# Patient Record
Sex: Male | Born: 1935 | Race: White | Hispanic: No | Marital: Married | Smoking: Former smoker
Health system: Southern US, Community
[De-identification: ages and names within clinical notes are randomized; demographics above are authoritative.]

## PROBLEM LIST (undated history)

## (undated) DIAGNOSIS — I221 Subsequent ST elevation (STEMI) myocardial infarction of inferior wall: Secondary | ICD-10-CM

## (undated) DIAGNOSIS — J8410 Pulmonary fibrosis, unspecified: Secondary | ICD-10-CM

## (undated) DIAGNOSIS — E039 Hypothyroidism, unspecified: Secondary | ICD-10-CM

## (undated) DIAGNOSIS — K449 Diaphragmatic hernia without obstruction or gangrene: Secondary | ICD-10-CM

## (undated) DIAGNOSIS — J302 Other seasonal allergic rhinitis: Secondary | ICD-10-CM

## (undated) DIAGNOSIS — K635 Polyp of colon: Secondary | ICD-10-CM

## (undated) DIAGNOSIS — K602 Anal fissure, unspecified: Secondary | ICD-10-CM

## (undated) DIAGNOSIS — I10 Essential (primary) hypertension: Secondary | ICD-10-CM

## (undated) DIAGNOSIS — N189 Chronic kidney disease, unspecified: Secondary | ICD-10-CM

## (undated) DIAGNOSIS — I2510 Atherosclerotic heart disease of native coronary artery without angina pectoris: Secondary | ICD-10-CM

## (undated) DIAGNOSIS — I2119 ST elevation (STEMI) myocardial infarction involving other coronary artery of inferior wall: Secondary | ICD-10-CM

## (undated) DIAGNOSIS — E785 Hyperlipidemia, unspecified: Secondary | ICD-10-CM

## (undated) DIAGNOSIS — K219 Gastro-esophageal reflux disease without esophagitis: Secondary | ICD-10-CM

## (undated) DIAGNOSIS — H269 Unspecified cataract: Secondary | ICD-10-CM

## (undated) HISTORY — DX: Unspecified cataract: H26.9

## (undated) HISTORY — DX: Diaphragmatic hernia without obstruction or gangrene: K44.9

## (undated) HISTORY — DX: Essential (primary) hypertension: I10

## (undated) HISTORY — PX: POLYPECTOMY: SHX149

## (undated) HISTORY — DX: Other seasonal allergic rhinitis: J30.2

## (undated) HISTORY — DX: Hyperlipidemia, unspecified: E78.5

## (undated) HISTORY — DX: Atherosclerotic heart disease of native coronary artery without angina pectoris: I25.10

## (undated) HISTORY — PX: COLONOSCOPY: SHX174

## (undated) HISTORY — DX: Subsequent ST elevation (STEMI) myocardial infarction of inferior wall: I22.1

## (undated) HISTORY — DX: Anal fissure, unspecified: K60.2

## (undated) HISTORY — DX: Pulmonary fibrosis, unspecified: J84.10

## (undated) HISTORY — DX: ST elevation (STEMI) myocardial infarction involving other coronary artery of inferior wall: I21.19

## (undated) HISTORY — DX: Chronic kidney disease, unspecified: N18.9

## (undated) HISTORY — DX: Gastro-esophageal reflux disease without esophagitis: K21.9

## (undated) HISTORY — DX: Polyp of colon: K63.5

## (undated) HISTORY — DX: Hypothyroidism, unspecified: E03.9

---

## 1982-11-29 DIAGNOSIS — I2119 ST elevation (STEMI) myocardial infarction involving other coronary artery of inferior wall: Secondary | ICD-10-CM

## 1982-11-29 HISTORY — DX: ST elevation (STEMI) myocardial infarction involving other coronary artery of inferior wall: I21.19

## 1990-11-29 HISTORY — PX: ABDOMINAL AORTIC ANEURYSM REPAIR: SUR1152

## 1997-11-29 DIAGNOSIS — H269 Unspecified cataract: Secondary | ICD-10-CM

## 1997-11-29 HISTORY — DX: Unspecified cataract: H26.9

## 1999-05-28 ENCOUNTER — Inpatient Hospital Stay (HOSPITAL_COMMUNITY): Admission: EM | Admit: 1999-05-28 | Discharge: 1999-05-29 | Payer: Self-pay | Admitting: Emergency Medicine

## 1999-05-28 ENCOUNTER — Encounter: Payer: Self-pay | Admitting: Cardiology

## 2001-01-30 ENCOUNTER — Ambulatory Visit (HOSPITAL_COMMUNITY): Admission: RE | Admit: 2001-01-30 | Discharge: 2001-01-30 | Payer: Self-pay | Admitting: Gastroenterology

## 2001-01-30 ENCOUNTER — Encounter (INDEPENDENT_AMBULATORY_CARE_PROVIDER_SITE_OTHER): Payer: Self-pay | Admitting: *Deleted

## 2002-11-29 HISTORY — PX: CORONARY ARTERY BYPASS GRAFT: SHX141

## 2003-02-28 DIAGNOSIS — I221 Subsequent ST elevation (STEMI) myocardial infarction of inferior wall: Secondary | ICD-10-CM

## 2003-02-28 HISTORY — DX: Subsequent ST elevation (STEMI) myocardial infarction of inferior wall: I22.1

## 2003-02-28 HISTORY — PX: CARDIAC CATHETERIZATION: SHX172

## 2003-03-25 ENCOUNTER — Encounter: Payer: Self-pay | Admitting: Cardiothoracic Surgery

## 2003-03-25 ENCOUNTER — Encounter: Payer: Self-pay | Admitting: Emergency Medicine

## 2003-03-25 ENCOUNTER — Inpatient Hospital Stay (HOSPITAL_COMMUNITY): Admission: EM | Admit: 2003-03-25 | Discharge: 2003-04-01 | Payer: Self-pay | Admitting: Emergency Medicine

## 2003-03-26 ENCOUNTER — Encounter: Payer: Self-pay | Admitting: Cardiothoracic Surgery

## 2003-03-27 ENCOUNTER — Encounter: Payer: Self-pay | Admitting: Cardiothoracic Surgery

## 2003-03-28 ENCOUNTER — Encounter: Payer: Self-pay | Admitting: Cardiothoracic Surgery

## 2004-02-19 ENCOUNTER — Ambulatory Visit (HOSPITAL_COMMUNITY): Admission: RE | Admit: 2004-02-19 | Discharge: 2004-02-19 | Payer: Self-pay | Admitting: Gastroenterology

## 2004-02-19 ENCOUNTER — Encounter (INDEPENDENT_AMBULATORY_CARE_PROVIDER_SITE_OTHER): Payer: Self-pay | Admitting: *Deleted

## 2004-03-16 ENCOUNTER — Emergency Department (HOSPITAL_COMMUNITY): Admission: EM | Admit: 2004-03-16 | Discharge: 2004-03-16 | Payer: Self-pay | Admitting: Emergency Medicine

## 2004-05-15 ENCOUNTER — Ambulatory Visit (HOSPITAL_COMMUNITY): Admission: RE | Admit: 2004-05-15 | Discharge: 2004-05-15 | Payer: Self-pay | Admitting: Orthopedic Surgery

## 2005-03-29 HISTORY — PX: TRANSTHORACIC ECHOCARDIOGRAM: SHX275

## 2005-06-28 ENCOUNTER — Ambulatory Visit: Payer: Self-pay | Admitting: Internal Medicine

## 2005-07-26 ENCOUNTER — Ambulatory Visit: Payer: Self-pay | Admitting: Internal Medicine

## 2005-10-25 ENCOUNTER — Ambulatory Visit: Payer: Self-pay | Admitting: Internal Medicine

## 2006-04-26 ENCOUNTER — Ambulatory Visit: Payer: Self-pay | Admitting: Internal Medicine

## 2006-08-24 ENCOUNTER — Ambulatory Visit: Payer: Self-pay | Admitting: Internal Medicine

## 2006-10-13 ENCOUNTER — Ambulatory Visit: Payer: Self-pay | Admitting: Internal Medicine

## 2006-10-18 ENCOUNTER — Ambulatory Visit: Payer: Self-pay | Admitting: Internal Medicine

## 2007-01-06 ENCOUNTER — Ambulatory Visit: Payer: Self-pay | Admitting: Internal Medicine

## 2007-01-06 LAB — CONVERTED CEMR LAB: Creatinine, Ser: 1.2 mg/dL (ref 0.4–1.5)

## 2007-01-11 ENCOUNTER — Ambulatory Visit: Payer: Self-pay | Admitting: Internal Medicine

## 2007-03-30 ENCOUNTER — Ambulatory Visit: Payer: Self-pay | Admitting: Internal Medicine

## 2007-04-13 ENCOUNTER — Ambulatory Visit: Payer: Self-pay | Admitting: Internal Medicine

## 2007-04-26 ENCOUNTER — Ambulatory Visit: Payer: Self-pay | Admitting: Internal Medicine

## 2007-04-26 ENCOUNTER — Encounter: Payer: Self-pay | Admitting: Internal Medicine

## 2007-06-13 ENCOUNTER — Ambulatory Visit: Payer: Self-pay | Admitting: Internal Medicine

## 2007-06-22 ENCOUNTER — Ambulatory Visit: Payer: Self-pay | Admitting: Gastroenterology

## 2007-06-22 LAB — CONVERTED CEMR LAB: Creatinine, Ser: 1.3 mg/dL (ref 0.4–1.5)

## 2007-07-19 ENCOUNTER — Ambulatory Visit: Payer: Self-pay | Admitting: Internal Medicine

## 2008-01-05 ENCOUNTER — Ambulatory Visit: Payer: Self-pay | Admitting: Internal Medicine

## 2008-01-12 ENCOUNTER — Ambulatory Visit: Payer: Self-pay | Admitting: Internal Medicine

## 2008-01-12 DIAGNOSIS — E038 Other specified hypothyroidism: Secondary | ICD-10-CM | POA: Insufficient documentation

## 2008-01-12 DIAGNOSIS — N320 Bladder-neck obstruction: Secondary | ICD-10-CM

## 2008-01-12 DIAGNOSIS — I2510 Atherosclerotic heart disease of native coronary artery without angina pectoris: Secondary | ICD-10-CM | POA: Insufficient documentation

## 2008-01-12 DIAGNOSIS — Z888 Allergy status to other drugs, medicaments and biological substances status: Secondary | ICD-10-CM

## 2008-01-14 LAB — CONVERTED CEMR LAB
ALT: 15 units/L (ref 0–53)
AST: 20 units/L (ref 0–37)
Albumin: 3.7 g/dL (ref 3.5–5.2)
Alkaline Phosphatase: 45 units/L (ref 39–117)
Basophils Absolute: 0 10*3/uL (ref 0.0–0.1)
Calcium: 9.7 mg/dL (ref 8.4–10.5)
Chloride: 108 meq/L (ref 96–112)
Creatinine, Ser: 1.4 mg/dL (ref 0.4–1.5)
Eosinophils Absolute: 0.2 10*3/uL (ref 0.0–0.6)
Eosinophils Relative: 3.4 % (ref 0.0–5.0)
GFR calc non Af Amer: 52 mL/min
HCT: 44.7 % (ref 39.0–52.0)
MCHC: 33.3 g/dL (ref 30.0–36.0)
MCV: 95.9 fL (ref 78.0–100.0)
Neutrophils Relative %: 65 % (ref 43.0–77.0)
Platelets: 155 10*3/uL (ref 150–400)
RBC: 4.66 M/uL (ref 4.22–5.81)
RDW: 11.8 % (ref 11.5–14.6)
Total Bilirubin: 1.1 mg/dL (ref 0.3–1.2)
WBC: 7.3 10*3/uL (ref 4.5–10.5)

## 2008-01-18 ENCOUNTER — Ambulatory Visit: Payer: Self-pay | Admitting: Internal Medicine

## 2008-01-18 DIAGNOSIS — E039 Hypothyroidism, unspecified: Secondary | ICD-10-CM

## 2008-01-18 DIAGNOSIS — E785 Hyperlipidemia, unspecified: Secondary | ICD-10-CM | POA: Insufficient documentation

## 2008-01-18 DIAGNOSIS — I10 Essential (primary) hypertension: Secondary | ICD-10-CM

## 2008-02-14 ENCOUNTER — Ambulatory Visit: Payer: Self-pay | Admitting: Internal Medicine

## 2008-03-21 ENCOUNTER — Ambulatory Visit: Payer: Self-pay | Admitting: Internal Medicine

## 2008-03-30 DIAGNOSIS — I714 Abdominal aortic aneurysm, without rupture, unspecified: Secondary | ICD-10-CM | POA: Insufficient documentation

## 2008-03-30 DIAGNOSIS — K5190 Ulcerative colitis, unspecified, without complications: Secondary | ICD-10-CM

## 2008-03-30 DIAGNOSIS — D126 Benign neoplasm of colon, unspecified: Secondary | ICD-10-CM

## 2008-03-30 DIAGNOSIS — Z87442 Personal history of urinary calculi: Secondary | ICD-10-CM | POA: Insufficient documentation

## 2008-04-24 ENCOUNTER — Ambulatory Visit: Payer: Self-pay | Admitting: Internal Medicine

## 2008-06-27 ENCOUNTER — Ambulatory Visit: Payer: Self-pay | Admitting: Internal Medicine

## 2008-10-28 ENCOUNTER — Encounter: Payer: Self-pay | Admitting: Internal Medicine

## 2008-10-29 ENCOUNTER — Ambulatory Visit: Payer: Self-pay | Admitting: Internal Medicine

## 2008-10-29 DIAGNOSIS — K921 Melena: Secondary | ICD-10-CM

## 2009-05-01 ENCOUNTER — Ambulatory Visit: Payer: Self-pay | Admitting: Internal Medicine

## 2009-05-20 ENCOUNTER — Encounter (INDEPENDENT_AMBULATORY_CARE_PROVIDER_SITE_OTHER): Payer: Self-pay | Admitting: *Deleted

## 2009-06-05 ENCOUNTER — Encounter (INDEPENDENT_AMBULATORY_CARE_PROVIDER_SITE_OTHER): Payer: Self-pay | Admitting: *Deleted

## 2009-07-11 ENCOUNTER — Telehealth: Payer: Self-pay | Admitting: Internal Medicine

## 2009-07-15 ENCOUNTER — Ambulatory Visit: Payer: Self-pay | Admitting: Internal Medicine

## 2009-07-30 ENCOUNTER — Encounter: Payer: Self-pay | Admitting: Internal Medicine

## 2009-07-30 ENCOUNTER — Ambulatory Visit: Payer: Self-pay | Admitting: Internal Medicine

## 2009-07-30 LAB — HM COLONOSCOPY

## 2009-07-31 ENCOUNTER — Encounter: Payer: Self-pay | Admitting: Internal Medicine

## 2009-08-01 ENCOUNTER — Telehealth (INDEPENDENT_AMBULATORY_CARE_PROVIDER_SITE_OTHER): Payer: Self-pay | Admitting: *Deleted

## 2009-08-27 ENCOUNTER — Ambulatory Visit: Payer: Self-pay | Admitting: Internal Medicine

## 2009-08-27 DIAGNOSIS — K219 Gastro-esophageal reflux disease without esophagitis: Secondary | ICD-10-CM | POA: Insufficient documentation

## 2009-09-08 ENCOUNTER — Ambulatory Visit: Payer: Self-pay | Admitting: Internal Medicine

## 2009-10-28 ENCOUNTER — Ambulatory Visit: Payer: Self-pay | Admitting: Internal Medicine

## 2009-10-28 LAB — CONVERTED CEMR LAB
ALT: 16 units/L (ref 0–53)
AST: 23 units/L (ref 0–37)
Albumin: 3.6 g/dL (ref 3.5–5.2)
BUN: 18 mg/dL (ref 6–23)
Basophils Relative: 0.7 % (ref 0.0–3.0)
CO2: 28 meq/L (ref 19–32)
Chloride: 110 meq/L (ref 96–112)
Cholesterol: 121 mg/dL (ref 0–200)
Creatinine, Ser: 1.2 mg/dL (ref 0.4–1.5)
Eosinophils Relative: 3.8 % (ref 0.0–5.0)
Glucose, Bld: 98 mg/dL (ref 70–99)
Hemoglobin: 14 g/dL (ref 13.0–17.0)
LDL Cholesterol: 84 mg/dL (ref 0–99)
MCV: 97.4 fL (ref 78.0–100.0)
Monocytes Absolute: 0.9 10*3/uL (ref 0.1–1.0)
Neutro Abs: 4.8 10*3/uL (ref 1.4–7.7)
Neutrophils Relative %: 61.5 % (ref 43.0–77.0)
Potassium: 4.8 meq/L (ref 3.5–5.1)
RBC: 4.24 M/uL (ref 4.22–5.81)
Total Bilirubin: 0.9 mg/dL (ref 0.3–1.2)
Total Protein: 7.7 g/dL (ref 6.0–8.3)
WBC: 7.9 10*3/uL (ref 4.5–10.5)

## 2009-10-31 ENCOUNTER — Ambulatory Visit: Payer: Self-pay | Admitting: Internal Medicine

## 2009-10-31 DIAGNOSIS — R05 Cough: Secondary | ICD-10-CM

## 2009-10-31 DIAGNOSIS — Z87891 Personal history of nicotine dependence: Secondary | ICD-10-CM | POA: Insufficient documentation

## 2009-10-31 DIAGNOSIS — M79609 Pain in unspecified limb: Secondary | ICD-10-CM | POA: Insufficient documentation

## 2010-01-28 ENCOUNTER — Ambulatory Visit: Payer: Self-pay | Admitting: Internal Medicine

## 2010-01-28 LAB — CONVERTED CEMR LAB: TSH: 0.08 microintl units/mL — ABNORMAL LOW (ref 0.35–5.50)

## 2010-02-02 ENCOUNTER — Ambulatory Visit: Payer: Self-pay | Admitting: Internal Medicine

## 2010-03-10 ENCOUNTER — Ambulatory Visit: Payer: Self-pay | Admitting: Internal Medicine

## 2010-03-10 LAB — CONVERTED CEMR LAB
Basophils Relative: 0.3 % (ref 0.0–3.0)
CO2: 28 meq/L (ref 19–32)
Chloride: 110 meq/L (ref 96–112)
Creatinine, Ser: 1.4 mg/dL (ref 0.4–1.5)
Eosinophils Relative: 2.5 % (ref 0.0–5.0)
Glucose, Bld: 95 mg/dL (ref 70–99)
HCT: 39.4 % (ref 39.0–52.0)
Lymphs Abs: 1.6 10*3/uL (ref 0.7–4.0)
MCV: 94.7 fL (ref 78.0–100.0)
Monocytes Absolute: 0.6 10*3/uL (ref 0.1–1.0)
RBC: 4.16 M/uL — ABNORMAL LOW (ref 4.22–5.81)
Sodium: 144 meq/L (ref 135–145)
WBC: 6.4 10*3/uL (ref 4.5–10.5)

## 2010-03-16 ENCOUNTER — Ambulatory Visit: Payer: Self-pay | Admitting: Internal Medicine

## 2010-05-18 ENCOUNTER — Encounter: Payer: Self-pay | Admitting: Internal Medicine

## 2010-07-06 ENCOUNTER — Ambulatory Visit: Payer: Self-pay | Admitting: Internal Medicine

## 2010-07-06 LAB — CONVERTED CEMR LAB
BUN: 32 mg/dL — ABNORMAL HIGH (ref 6–23)
CO2: 29 meq/L (ref 19–32)
Chloride: 103 meq/L (ref 96–112)
Creatinine, Ser: 1.5 mg/dL (ref 0.4–1.5)
Glucose, Bld: 138 mg/dL — ABNORMAL HIGH (ref 70–99)

## 2010-07-16 ENCOUNTER — Ambulatory Visit: Payer: Self-pay | Admitting: Internal Medicine

## 2010-07-16 DIAGNOSIS — E875 Hyperkalemia: Secondary | ICD-10-CM

## 2010-07-16 DIAGNOSIS — R7309 Other abnormal glucose: Secondary | ICD-10-CM

## 2010-09-17 ENCOUNTER — Ambulatory Visit: Payer: Self-pay | Admitting: Cardiology

## 2010-11-09 ENCOUNTER — Ambulatory Visit: Payer: Self-pay | Admitting: Internal Medicine

## 2010-11-11 LAB — CONVERTED CEMR LAB
ALT: 13 units/L (ref 0–53)
AST: 20 units/L (ref 0–37)
Albumin: 3.8 g/dL (ref 3.5–5.2)
Basophils Relative: 0 % (ref 0.0–3.0)
Blood, UA: NEGATIVE
CO2: 28 meq/L (ref 19–32)
Chloride: 106 meq/L (ref 96–112)
Cholesterol: 162 mg/dL (ref 0–200)
Eosinophils Relative: 1.8 % (ref 0.0–5.0)
HCT: 42.3 % (ref 39.0–52.0)
LDL Cholesterol: 116 mg/dL — ABNORMAL HIGH (ref 0–99)
Lymphs Abs: 1.2 10*3/uL (ref 0.7–4.0)
MCHC: 33.6 g/dL (ref 30.0–36.0)
MCV: 98.4 fL (ref 78.0–100.0)
Monocytes Absolute: 0.8 10*3/uL (ref 0.1–1.0)
Neutro Abs: 4.4 10*3/uL (ref 1.4–7.7)
Nitrite: NEGATIVE
PSA: 1.91 ng/mL (ref 0.10–4.00)
Potassium: 4.5 meq/L (ref 3.5–5.1)
RBC: 4.3 M/uL (ref 4.22–5.81)
Specific Gravity, Urine: 1.015 (ref 1.000–1.030)
Total Protein, Urine: NEGATIVE mg/dL
Total Protein: 7.6 g/dL (ref 6.0–8.3)
WBC: 6.6 10*3/uL (ref 4.5–10.5)
pH: 6 (ref 5.0–8.0)

## 2010-11-29 DIAGNOSIS — K219 Gastro-esophageal reflux disease without esophagitis: Secondary | ICD-10-CM

## 2010-11-29 HISTORY — DX: Gastro-esophageal reflux disease without esophagitis: K21.9

## 2010-12-29 NOTE — Assessment & Plan Note (Signed)
Summary: 6 WKS FOLLOW UP-LB   Vital Signs:  Patient profile:   77 year old male Height:      71 inches Weight:      191.50 pounds BMI:     26.81 O2 Sat:      96 % on Room air Temp:     96.5 degrees F oral Pulse rate:   58 / minute BP sitting:   128 / 84  (left arm) Cuff size:   regular  Vitals Entered By: Chris Stokes (March 16, 2010 2:44 PM)  O2 Flow:  Room air CC: 6 wk f/u./kb Is Patient Diabetic? No Pain Assessment Patient in pain? no        Primary Care Provider:  Sonda Stokes  CC:  6 wk f/u./kb.  History of Present Illness: The patient presents for a follow up of hypertension, CAD, hyperlipidemia   Current Medications (verified): 1)  Coreg 25 Mg  Tabs (Carvedilol) .... Once Daily 2)  Spironolactone 25 Mg Tabs (Spironolactone) .[name].. 1 By Mouth Qd 3)  Asacol 400 Mg  Tbec (Mesalamine) .... 4 By Mouth Three Times A Day 4)  Loratadine 10 Mg  Tabs (Loratadine) .... Once Daily As Needed Allergies 5)  Vitamin D3 1000 Unit  Tabs (Cholecalciferol) .[name].. 1 Qd 6)  Fish Oil   Oil (Fish Oil) .[name].. 1 By Mouth Bid 7)  Aspirin 81 Mg  Tbec (Aspirin) .... One By Mouth Every Day 8)  Altace 10 Mg  Tabs (Ramipril) .[name].. 1 Once Daily 9)  Zocor 80 Mg  Tabs (Simvastatin) .[name].. 1 Once Daily 10)  Ranitidine Hcl 75 Mg  Tabs (Ranitidine Hcl) .[name].. 1 Two Times A Day 11)  Nexium 40 Mg Cpdr (Esomeprazole Magnesium) .[name].. 1 Capsule By Mouth Once Daily 12)  Levothroid 100 Mcg Tabs (Levothyroxine Sodium) .[name].. 1 By Mouth Once Daily For Thyroid  Allergies (verified): 1)  ! Sulfa  Past History:  Past Medical History: Last updated: 10/29/2008 Coronary artery disease Dr Patty Sermons Hyperlipidemia Hypertension Hypothyroidism Ulcerative colitis Dr Marina Goodell  Social History: Last updated: 01/18/2008 Retired Married, wife had a CVA 2009 Former Smoker  Physical Exam  General:  Well developed, well nourished, no acute distress. Ears:  hard hearing Nose:  External nasal examination shows no  deformity or inflammation. Nasal mucosa are pink and moist without lesions or exudates. Mouth:  Erythematous throat mucosa and intranasal erythema.  Lungs:  Clear throughout to auscultation. Heart:  Regular rate and rhythm; no murmurs, rubs,  or bruits. Abdomen:  Soft, nontender and nondistended. No masses, hepatosplenomegaly or hernias noted. Normal bowel sounds. Msk:  Symmetrical with no gross deformities. Normal posture. L extensor index is tender over the nuckle Neurologic:  Alert and  oriented x4; unstedy on one foot B Skin:  Intact without significant lesions or rashes. Psych:  Alert and cooperative. Normal mood and affect.   Impression & Recommendations:  Problem # 1:  HAND PAIN (ICD-729.5) Assessment Improved  Problem # 2:  GERD (ICD-530.81) Assessment: Unchanged  His updated medication list for this problem includes:    Ranitidine Hcl 75 Mg Tabs (Ranitidine hcl) .[name]... 1 two times a day    Nexium 40 Mg Cpdr (Esomeprazole magnesium) .[name]... 1 capsule by mouth once daily  Problem # 3:  HYPOTHYROIDISM (ICD-244.9) Assessment: Improved  His updated medication list for this problem includes:    Levothroid 100 Mcg Tabs (Levothyroxine sodium) .[name]... 1 by mouth once daily for thyroid  Problem # 4:  CORONARY ARTERY DISEASE (ICD-414.00) Assessment: Unchanged  His updated medication list for this problem includes:    Coreg 25 Mg Tabs (Carvedilol) ..... Once daily    Spironolactone 25 Mg Tabs (Spironolactone) .[name]... 1 by mouth qd    Aspirin 81 Mg Tbec (Aspirin) ..... One by mouth every day    Altace 10 Mg Tabs (Ramipril) .[name]... 1 once daily  Complete Medication List: 1)  Coreg 25 Mg Tabs (Carvedilol) .... Once daily 2)  Spironolactone 25 Mg Tabs (Spironolactone) .[name].. 1 by mouth qd 3)  Asacol 400 Mg Tbec (Mesalamine) .... 4 by mouth three times a day 4)  Loratadine 10 Mg Tabs (Loratadine) .... Once daily as needed allergies 5)  Vitamin D3 1000 Unit Tabs (Cholecalciferol) .[name].. 1 qd 6)   Fish Oil Oil (Fish oil) .[name].. 1 by mouth bid 7)  Aspirin 81 Mg Tbec (Aspirin) .... One by mouth every day 8)  Altace 10 Mg Tabs (Ramipril) .[name].. 1 once daily 9)  Zocor 80 Mg Tabs (Simvastatin) .[name].. 1 once daily 10)  Ranitidine Hcl 75 Mg Tabs (Ranitidine hcl) .[name].. 1 two times a day 11)  Nexium 40 Mg Cpdr (Esomeprazole magnesium) .[name].. 1 capsule by mouth once daily 12)  Levothroid 100 Mcg Tabs (Levothyroxine sodium) .[name].. 1 by mouth once daily for thyroid  Patient Instructions: 1)  Please schedule a follow-up appointment in 4 months. 2)  BMP prior to visit, ICD-9:401.1

## 2010-12-29 NOTE — Assessment & Plan Note (Signed)
Summary: 3 MO FU/PN   Vital Signs:  Patient profile:   77 year old male Weight:      191 pounds Temp:     97.8 degrees F oral Pulse rate:   53 / minute BP sitting:   118 / 74  (left arm)  Vitals Entered By: Tora Perches (February 02, 2010 1:03 PM) CC: f/u Is Patient Diabetic? No   Primary Care Provider:  Trinna Post Plotnikov  CC:  f/u.  History of Present Illness: The patient presents for a follow up of hypertension, colitis, hyperlipidemia C/o cold hands  Preventive Screening-Counseling & Management  Alcohol-Tobacco     Smoking Status: quit  Current Medications (verified): 1)  Coreg 25 Mg  Tabs (Carvedilol) .... Once Daily 2)  Spironolactone 25 Mg Tabs (Spironolactone) .[name].. 1 By Mouth Qd 3)  Asacol 400 Mg  Tbec (Mesalamine) .... 4 By Mouth Three Times A Day 4)  Loratadine 10 Mg  Tabs (Loratadine) .... Once Daily As Needed Allergies 5)  Vitamin D3 1000 Unit  Tabs (Cholecalciferol) .[name].. 1 Qd 6)  Fish Oil   Oil (Fish Oil) .[name].. 1 By Mouth Bid 7)  Aspirin 81 Mg  Tbec (Aspirin) .... One By Mouth Every Day 8)  Altace 10 Mg  Tabs (Ramipril) .[name].. 1 Once Daily 9)  Zocor 80 Mg  Tabs (Simvastatin) .[name].. 1 Once Daily 10)  Ranitidine Hcl 75 Mg  Tabs (Ranitidine Hcl) .[name].. 1 Two Times A Day 11)  Nexium 40 Mg Cpdr (Esomeprazole Magnesium) .[name].. 1 Capsule By Mouth Once Daily 12)  Levothroid 150 Mcg Tabs (Levothyroxine Sodium) .[name].. 1 By Mouth Qd  Allergies: 1)  ! Sulfa  Past History:  Past Medical History: Last updated: 10/29/2008 Coronary artery disease Dr Patty Sermons Hyperlipidemia Hypertension Hypothyroidism Ulcerative colitis Dr Marina Goodell  Past Surgical History: Last updated: 01/18/2008 Abdominal aortic aneurysm repair Coronary artery bypass graft  Family History: Last updated: 06/27/2008 Family History Hypertension  Family History of Colon Cancer: cousin  Social History: Last updated: 01/18/2008 Retired Married, wife had a CVA 2009 Former Smoker  Review of Systems   The patient complains of abdominal pain.  The patient denies chest pain, syncope, dyspnea on exertion, melena, and hematochezia.    Physical Exam  General:  Well developed, well nourished, no acute distress. Ears:  hard hearing Nose:  External nasal examination shows no deformity or inflammation. Nasal mucosa are pink and moist without lesions or exudates. Mouth:  Erythematous throat mucosa and intranasal erythema.  Lungs:  Clear throughout to auscultation. Heart:  Regular rate and rhythm; no murmurs, rubs,  or bruits. Abdomen:  Soft, nontender and nondistended. No masses, hepatosplenomegaly or hernias noted. Normal bowel sounds. Msk:  Symmetrical with no gross deformities. Normal posture. L extensor index is tender over the nuckle Neurologic:  Alert and  oriented x4; unstedy on one foot B Skin:  Intact without significant lesions or rashes. Psych:  Alert and cooperative. Normal mood and affect.   Impression & Recommendations:  Problem # 1:  GERD (ICD-530.81) Assessment Improved  His updated medication list for this problem includes:    Ranitidine Hcl 75 Mg Tabs (Ranitidine hcl) .[name]... 1 two times a day    Nexium 40 Mg Cpdr (Esomeprazole magnesium) .[name]... 1 capsule by mouth once daily  Problem # 2:  HYPERTENSION (ICD-401.9) Assessment: Improved  His updated medication list for this problem includes:    Coreg 25 Mg Tabs (Carvedilol) ..... Once daily    Spironolactone 25 Mg Tabs (Spironolactone) .[name]... 1 by  mouth qd    Altace 10 Mg Tabs (Ramipril) .[name]... 1 once daily  BP today: 118/74 Prior BP: 124/80 (10/31/2009)  Labs Reviewed: K+: 4.8 (10/28/2009) Creat: : 1.2 (10/28/2009)   Chol: 121 (10/28/2009)   HDL: 27.60 (10/28/2009)   LDL: 84 (10/28/2009)   TG: 47.0 (10/28/2009)  Problem # 3:  HYPERLIPIDEMIA (ICD-272.4) Assessment: Improved  His updated medication list for this problem includes:    Zocor 80 Mg Tabs (Simvastatin) .[name]... 1 once daily  Labs Reviewed: SGOT: 23  (10/28/2009)   SGPT: 16 (10/28/2009)   HDL:27.60 (10/28/2009)  LDL:84 (10/28/2009)  Chol:121 (10/28/2009)  Trig:47.0 (10/28/2009)  Problem # 4:  CORONARY ARTERY DISEASE (ICD-414.00) Assessment: Unchanged  His updated medication list for this problem includes:    Coreg 25 Mg Tabs (Carvedilol) ..... Once daily    Spironolactone 25 Mg Tabs (Spironolactone) .[name]... 1 by mouth qd    Aspirin 81 Mg Tbec (Aspirin) ..... One by mouth every day    Altace 10 Mg Tabs (Ramipril) .[name]... 1 once daily  Complete Medication List: 1)  Coreg 25 Mg Tabs (Carvedilol) .... Once daily 2)  Spironolactone 25 Mg Tabs (Spironolactone) .[name].. 1 by mouth qd 3)  Asacol 400 Mg Tbec (Mesalamine) .... 4 by mouth three times a day 4)  Loratadine 10 Mg Tabs (Loratadine) .... Once daily as needed allergies 5)  Vitamin D3 1000 Unit Tabs (Cholecalciferol) .[name].. 1 qd 6)  Fish Oil Oil (Fish oil) .[name].. 1 by mouth bid 7)  Aspirin 81 Mg Tbec (Aspirin) .... One by mouth every day 8)  Altace 10 Mg Tabs (Ramipril) .[name].. 1 once daily 9)  Zocor 80 Mg Tabs (Simvastatin) .[name].. 1 once daily 10)  Ranitidine Hcl 75 Mg Tabs (Ranitidine hcl) .[name].. 1 two times a day 11)  Nexium 40 Mg Cpdr (Esomeprazole magnesium) .[name].. 1 capsule by mouth once daily 12)  Levothroid 100 Mcg Tabs (Levothyroxine sodium) .[name].. 1 by mouth once daily for thyroid  Patient Instructions: 1)  Please schedule a follow-up appointment in 6 wks. 2)  Use stretching exercises that I have provided (15 min. or longer every day) 3)  BMP prior to visit, ICD-9: 4)  TSH prior to visit, ICD-9: 5)  CBC 6)  Vit B12 7)  Vit D 995.20  244.8  782.0 Prescriptions: ZOCOR 80 MG  TABS (SIMVASTATIN) 1 once daily  #90 x 3   Entered and Authorized by:   Tresa Garter MD   Signed by:   Tresa Garter MD on 02/02/2010   Method used:   Print then Give to Patient   RxID:   [id number] SPIRONOLACTONE 25 MG TABS (SPIRONOLACTONE) 1 by mouth qd  #90 x 3   Entered and Authorized by:    Tresa Garter MD   Signed by:   Tresa Garter MD on 02/02/2010   Method used:   Print then Give to Patient   RxID:   [id number] LEVOTHROID 100 MCG TABS (LEVOTHYROXINE SODIUM) 1 by mouth once daily for thyroid  #30 x 12   Entered and Authorized by:   Tresa Garter MD   Signed by:   Tresa Garter MD on 02/02/2010   Method used:   Print then Give to Patient   RxID:   [id number]

## 2010-12-29 NOTE — Letter (Signed)
Summary: [facility]   Imported By: Lester Donegal 06/09/2010 07:41:17  _____________________________________________________________________  External Attachment:    Type:   Image     Comment:   External Document

## 2010-12-29 NOTE — Assessment & Plan Note (Signed)
Summary: 4 month follow up-lb   Vital Signs:  Patient profile:   77 year old male Height:      71 inches Weight:      189 pounds BMI:     26.46 Temp:     98.1 degrees F oral Pulse rate:   64 / minute Pulse rhythm:   regular Resp:     16 per minute BP sitting:   120 / 82  (left arm) Cuff size:   regular  Vitals Entered By: Lanier Prude, CMA(AAMA) (July 16, 2010 7:56 AM) CC: 4 mo f/u Is Patient Diabetic? No Comments pt is not taking Ranitidine or Zocor.  Please remove from list   Primary Care Provider:  Sonda Primes  CC:  4 mo f/u.  History of Present Illness: The patient presents for a follow up of hypertension, CAD, hyperlipidemia   Current Medications (verified): 1)  Coreg 25 Mg  Tabs (Carvedilol) .... Once Daily 2)  Spironolactone 25 Mg Tabs (Spironolactone) .[name].. 1 By Mouth Qd 3)  Asacol 400 Mg  Tbec (Mesalamine) .... 4 By Mouth Three Times A Day 4)  Loratadine 10 Mg  Tabs (Loratadine) .... Once Daily As Needed Allergies 5)  Vitamin D3 1000 Unit  Tabs (Cholecalciferol) .[name].. 1 Qd 6)  Fish Oil   Oil (Fish Oil) .[name].. 1 By Mouth Bid 7)  Aspirin 81 Mg  Tbec (Aspirin) .... One By Mouth Every Day 8)  Altace 10 Mg  Tabs (Ramipril) .[name].. 1 Once Daily 9)  Zocor 80 Mg  Tabs (Simvastatin) .[name].. 1 Once Daily 10)  Ranitidine Hcl 75 Mg  Tabs (Ranitidine Hcl) .[name].. 1 Two Times A Day 11)  Nexium 40 Mg Cpdr (Esomeprazole Magnesium) .[name].. 1 Capsule By Mouth Once Daily 12)  Levothroid 100 Mcg Tabs (Levothyroxine Sodium) .[name].. 1 By Mouth Once Daily For Thyroid 13)  Crestor 10 Mg Tabs (Rosuvastatin Calcium) .[name].. 1 Once Daily  Allergies (verified): 1)  ! Sulfa 2)  Simvastatin  Past History:  Past Medical History: Last updated: 10/29/2008 Coronary artery disease Dr Patty Sermons Hyperlipidemia Hypertension Hypothyroidism Ulcerative colitis Dr Marina Goodell  Past Surgical History: Last updated: 01/18/2008 Abdominal aortic aneurysm repair Coronary artery bypass graft  Family  History: Reviewed history from 06/27/2008 and no changes required. Family History Hypertension  Family History of Colon Cancer: cousin  Social History: Retired Married, wife had a CVA 2009 she has a lot of communication problem post CVA Former Smoker  Review of Systems  The patient denies fever, syncope, abdominal pain, and difficulty walking.    Physical Exam  General:  Well developed, well nourished, no acute distress. Ears:  hard hearing Nose:  External nasal examination shows no deformity or inflammation. Nasal mucosa are pink and moist without lesions or exudates. Mouth:  Erythematous throat mucosa and intranasal erythema.  Neck:  (?)mild R bruit Lungs:  Clear throughout to auscultation. Heart:  Regular rate and rhythm; no murmurs, rubs,  or bruits. Abdomen:  Soft, nontender and nondistended. No masses, hepatosplenomegaly or hernias noted. Normal bowel sounds. Msk:  Symmetrical with no gross deformities. Normal posture. L extensor index is tender over the nuckle Neurologic:  Alert and  oriented x4; unstedy on one foot B Skin:  Intact without significant lesions or rashes. Psych:  Alert and cooperative. Normal mood and affect.   Impression & Recommendations:  Problem # 1:  HYPERTENSION (ICD-401.9) Assessment Improved  The following medications were removed from the medication list:    Altace 10 Mg Tabs (Ramipril) .[name]... 1 once  daily His updated medication list for this problem includes:    Coreg 25 Mg Tabs (Carvedilol) ..... Once daily    Spironolactone 25 Mg Tabs (Spironolactone) .[name]... 1 by mouth qd    Altace 5 Mg Caps (Ramipril) .[name]... 1 by mouth qd  Problem # 2:  CORONARY ARTERY DISEASE (ICD-414.00) Assessment: Unchanged He will discuss carot doppler w/Dr Patty Sermons next time The following medications were removed from the medication list:    Altace 10 Mg Tabs (Ramipril) .[name]... 1 once daily His updated medication list for this problem includes:    Coreg 25 Mg Tabs  (Carvedilol) ..... Once daily    Spironolactone 25 Mg Tabs (Spironolactone) .[name]... 1 by mouth qd    Aspirin 81 Mg Tbec (Aspirin) ..... One by mouth every day    Altace 5 Mg Caps (Ramipril) .[name]... 1 by mouth qd  Problem # 3:  GERD (ICD-530.81) Assessment: Unchanged  His updated medication list for this problem includes:    Ranitidine Hcl 75 Mg Tabs (Ranitidine hcl) .[name]... 1 two times a day    Nexium 40 Mg Cpdr (Esomeprazole magnesium) .[name]... 1 capsule by mouth once daily  Problem # 4:  BLADDER NECK OBSTRUCTION (ICD-596.0) Assessment: Unchanged  Problem # 5:  HYPERLIPIDEMIA (ICD-272.4) Assessment: Unchanged  The following medications were removed from the medication list:    Zocor 80 Mg Tabs (Simvastatin) .[name]... 1 once daily His updated medication list for this problem includes:    Crestor 10 Mg Tabs (Rosuvastatin calcium) .[name]... 1 once daily  Problem # 6:  OTHER SPECIFIED ACQUIRED HYPOTHYROIDISM (ICD-244.8) Assessment: Comment Only  His updated medication list for this problem includes:    Levothroid 100 Mcg Tabs (Levothyroxine sodium) .[name]... 1 by mouth once daily for thyroid  Problem # 7:  HYPERKALEMIA (ICD-276.7) Assessment: [place] Altace dose in 1/2  Problem # 8:  HYPERGLYCEMIA (ICD-790.29) Assessment: Comment Only Discussed diet  Complete Medication List: 1)  Coreg 25 Mg Tabs (Carvedilol) .... Once daily 2)  Spironolactone 25 Mg Tabs (Spironolactone) .[name].. 1 by mouth qd 3)  Asacol 400 Mg Tbec (Mesalamine) .... 4 by mouth three times a day 4)  Loratadine 10 Mg Tabs (Loratadine) .... Once daily as needed allergies 5)  Vitamin D3 1000 Unit Tabs (Cholecalciferol) .[name].. 1 qd 6)  Fish Oil Oil (Fish oil) .[name].. 1 by mouth bid 7)  Aspirin 81 Mg Tbec (Aspirin) .... One by mouth every day 8)  Ranitidine Hcl 75 Mg Tabs (Ranitidine hcl) .[name].. 1 two times a day 9)  Nexium 40 Mg Cpdr (Esomeprazole magnesium) .[name].. 1 capsule by mouth once daily 10)  Levothroid 100 Mcg Tabs (Levothyroxine  sodium) .[name].. 1 by mouth once daily for thyroid 11)  Crestor 10 Mg Tabs (Rosuvastatin calcium) .[name].. 1 once daily 12)  Altace 5 Mg Caps (Ramipril) .[name].. 1 by mouth qd  Other Orders: Zoster (Shingles) Vaccine Live [phone]) Admin 1st Vaccine ([id number])  Patient Instructions: 1)  Please schedule a follow-up appointment in 4 months well w/labs. 2)  Take Altace 1/2 tab a day Prescriptions: ALTACE 5 MG CAPS (RAMIPRIL) 1 by mouth qd  #90 x 0   Entered and Authorized by:   Tresa Garter MD   Signed by:   Tresa Garter MD on 07/16/2010   Method used:   Print then Give to Patient   RxID:   [id number]    Immunizations Administered:  Zostavax # 1:    Vaccine Type: Zostavax    Site: left deltoid  Mfr: Merck    Dose: 0.65    Route: [place]    Given by: Lanier Prude, CMA(AAMA)    Exp. Date: 06/24/2011    Lot #: [id number]    VIS given: 09/10/05 given July 16, 2010.

## 2010-12-31 NOTE — Assessment & Plan Note (Signed)
Summary: 4 mos well/will come fasting/#/cd   Vital Signs:  Patient profile:   77 year old male Height:      71 inches Weight:      196 pounds BMI:     27.44 Temp:     98.9 degrees F oral Pulse rate:   80 / minute Pulse rhythm:   regular Resp:     16 per minute BP sitting:   146 / 70  (left arm) Cuff size:   regular  Vitals Entered By: Lanier Prude, Beverly Gust) (November 09, 2010 9:53 AM) CC: MWV Is Patient Diabetic? No Comments pt needs Rf on Coreg, Asacol and Levothyroid. (90 day supply)   Primary Care Provider:  Sonda Primes  CC:  [facility].  History of Present Illness: The patient presents for a preventive health examination  Patient past medical history, social history, and family history reviewed in detail no significant changes.  Patient is physically active. Depression is negative and mood is good. Hearing is decreasedl, he is able to perform activities of daily living. Risk of falling is low and home safety has been reviewed and is appropriate. Patient has normal height, he is a little weight, and visual acuity w/glasses. Patient has been counseled on age-appropriate routine health concerns for screening and prevention. Education, counseling done.  Cognition is nl.  Preventive Screening-Counseling & Management  Alcohol-Tobacco     Alcohol drinks/day: 0     Smoking Status: quit     Year Quit: 1968  Caffeine-Diet-Exercise     Caffeine use/day: 2     Does Patient Exercise: yes     Type of exercise: walking     Times/week: 3  Hep-HIV-STD-Contraception     Dental Visit-last 6 months yes     TSE monthly: no     Sun Exposure-Excessive: no  Safety-Violence-Falls     Seat Belt Use: yes     Helmet Use: n/a     Firearms in the Home: no firearms in the home     Smoke Detectors: yes     Violence in the Home: no risk noted     Sexual Abuse: no     Fall Risk: no  Current Medications (verified): 1)  Coreg 25 Mg  Tabs (Carvedilol) .... Once Daily 2)  Spironolactone 25 Mg  Tabs (Spironolactone) .[name].. 1 By Mouth Qd 3)  Asacol 400 Mg  Tbec (Mesalamine) .... 4 By Mouth Three Times A Day 4)  Loratadine 10 Mg  Tabs (Loratadine) .... Once Daily As Needed Allergies 5)  Vitamin D3 1000 Unit  Tabs (Cholecalciferol) .[name].. 1 Qd 6)  Fish Oil   Oil (Fish Oil) .[name].. 1 By Mouth Bid 7)  Aspirin 81 Mg  Tbec (Aspirin) .... One By Mouth Every Day 8)  Ranitidine Hcl 75 Mg  Tabs (Ranitidine Hcl) .[name].. 1 Two Times A Day 9)  Nexium 40 Mg Cpdr (Esomeprazole Magnesium) .[name].. 1 Capsule By Mouth Once Daily 10)  Levothroid 100 Mcg Tabs (Levothyroxine Sodium) .[name].. 1 By Mouth Once Daily For Thyroid 11)  Crestor 10 Mg Tabs (Rosuvastatin Calcium) .[name].. 1 Once Daily 12)  Altace 5 Mg Caps (Ramipril) .[name].. 1 By Mouth Qd  Allergies (verified): 1)  ! Sulfa 2)  Simvastatin  Past History:  Past Medical History: Last updated: 10/29/2008 Coronary artery disease Dr Patty Sermons Hyperlipidemia Hypertension Hypothyroidism Ulcerative colitis Dr Marina Goodell  Past Surgical History: Last updated: 01/18/2008 Abdominal aortic aneurysm repair Coronary artery bypass graft  Family History: Last updated: 06/27/2008 Family History Hypertension  Family  History of Colon Cancer: cousin  Social History: Last updated: 07/16/2010 Retired Married, wife had a CVA 2009 she has a lot of communication problem post CVA Former Smoker  Social History: Caffeine use/day:  2 Does Patient Exercise:  yes Dental Care w/in 6 mos.:  yes Sun Exposure-Excessive:  no Risk analyst Use:  yes Fall Risk:  no  Review of Systems  The patient denies anorexia, fever, weight loss, weight gain, vision loss, decreased hearing, hoarseness, chest pain, syncope, dyspnea on exertion, peripheral edema, prolonged cough, headaches, hemoptysis, abdominal pain, melena, hematochezia, severe indigestion/heartburn, hematuria, incontinence, genital sores, muscle weakness, suspicious skin lesions, transient blindness, difficulty walking, depression,  unusual weight change, abnormal bleeding, enlarged lymph nodes, angioedema, and testicular masses.    Physical Exam  General:  Well developed, well nourished, no acute distress. Head:  Normocephalic and atraumatic without obvious abnormalities. No apparent alopecia or balding. Eyes:  No corneal or conjunctival inflammation noted. EOMI. Perrla. Ears:  External ear exam shows no significant lesions or deformities.  Otoscopic examination reveals clear canals, tympanic membranes are intact bilaterally without bulging, retraction, inflammation or discharge. Hearing is grossly normal bilaterally. Nose:  External nasal examination shows no deformity or inflammation. Nasal mucosa are pink and moist without lesions or exudates. Mouth:  WNL Neck:  (?)mild R bruit Lungs:  Clear throughout to auscultation. Heart:  Regular rate and rhythm; no murmurs, rubs,  or bruits. Abdomen:  Soft, nontender and nondistended. No masses, hepatosplenomegaly or hernias noted. Normal bowel sounds. Rectal:  per Dr Marina Goodell Prostate:  per Dr Marina Goodell Msk:  No deformity or scoliosis noted of thoracic or lumbar spine.   Neurologic:  Alert and  oriented x4; unstedy on one foot B Skin:  Intact without significant lesions or rashes. Psych:  Alert and cooperative. Normal mood and affect.   Impression & Recommendations:  Problem # 1:  HEALTH MAINTENANCE EXAM (ICD-V70.0) Assessment New  Overall doing well, age appropriate education and counseling updated and referral for appropriate preventive services done unless declined, immunizations up to date or declined, diet counseling done if overweight, urged to quit smoking if smokes, most recent labs reviewed and current ordered if appropriate, ecg reviewed or declined (interpretation per ECG scanned in the EMR if done); information regarding Medicare Preventation requirements given if appropriate.  I have personally reviewed the Medicare Annual Wellness questionnaire and have noted 1.    The patient's medical and social history 2.   Their use of alcohol, tobacco or illicit drugs 3.   Their current medications and supplements 4.   The patient's functional ability including ADL's, fall risks, home safety risks and hearing or visual             impairment. 5.   Diet and physical activities 6.   Evidence for depression or mood disorders  The patients weight, height, BMI and visual acuity have been recorded in the chart I have made referrals, counseling and provided education to the patient based review of the above and I have provided the pt with a written personalized care plan for preventive services.  Orders: TLB-BMP (Basic Metabolic Panel-BMET) (80048-METABOL) TLB-CBC Platelet - w/Differential (85025-CBCD) TLB-BNP (B-Natriuretic Peptide) (83880-BNPR) TLB-Hepatic/Liver Function Pnl (80076-HEPATIC) TLB-Lipid Panel (80061-LIPID) TLB-TSH (Thyroid Stimulating Hormone) (84443-TSH) TLB-Udip ONLY (81003-UDIP) TLB-PSA (Prostate Specific Antigen) (84153-PSA) Medicare -1st Annual Wellness Visit [phone])  Problem # 2:  CORONARY ARTERY DISEASE (ICD-414.00) Assessment: Unchanged Dr Patty Sermons q 4 months  His updated medication list for this problem includes:    Coreg 25 Mg  Tabs (Carvedilol) .[name]... 1 by mouth bid    Spironolactone 25 Mg Tabs (Spironolactone) .[name]... 1 by mouth qd    Aspirin 81 Mg Tbec (Aspirin) ..... One by mouth every day    Altace 5 Mg Caps (Ramipril) .[name]... 1 by mouth qd  Problem # 3:  ULCERATIVE COLITIS (ICD-556.9) Assessment: Unchanged Dr Marina Goodell q 2 years  Problem # 4:  HYPOTHYROIDISM (ICD-244.9) Assessment: Unchanged  His updated medication list for this problem includes:    Levothroid 100 Mcg Tabs (Levothyroxine sodium) .[name]... 1 by mouth once daily for thyroid  Problem # 5:  HYPERTENSION (ICD-401.9) Assessment: Deteriorated  His updated medication list for this problem includes:    Coreg 25 Mg Tabs (Carvedilol) .[name]... 1 by mouth bid    Spironolactone 25  Mg Tabs (Spironolactone) .[name]... 1 by mouth qd    Altace 5 Mg Caps (Ramipril) .[name]... 1 by mouth qd  BP today: 146/70 Prior BP: 120/82 (07/16/2010)  Labs Reviewed: K+: 5.8 (07/06/2010) Creat: : 1.5 (07/06/2010)   Chol: 121 (10/28/2009)   HDL: 27.60 (10/28/2009)   LDL: 84 (10/28/2009)   TG: 47.0 (10/28/2009)  Complete Medication List: 1)  Coreg 25 Mg Tabs (Carvedilol) .[name].. 1 by mouth bid 2)  Spironolactone 25 Mg Tabs (Spironolactone) .[name].. 1 by mouth qd 3)  Asacol 400 Mg Tbec (Mesalamine) .... 4 by mouth three times a day 4)  Loratadine 10 Mg Tabs (Loratadine) .... Once daily as needed allergies 5)  Vitamin D3 1000 Unit Tabs (Cholecalciferol) .[name].. 1 qd 6)  Fish Oil Oil (Fish oil) .[name].. 1 by mouth bid 7)  Aspirin 81 Mg Tbec (Aspirin) .... One by mouth every day 8)  Ranitidine Hcl 75 Mg Tabs (Ranitidine hcl) .[name].. 1 two times a day 9)  Nexium 40 Mg Cpdr (Esomeprazole magnesium) .[name].. 1 capsule by mouth once daily 10)  Levothroid 100 Mcg Tabs (Levothyroxine sodium) .[name].. 1 by mouth once daily for thyroid 11)  Crestor 10 Mg Tabs (Rosuvastatin calcium) .[name].. 1 once daily 12)  Altace 5 Mg Caps (Ramipril) .[name].. 1 by mouth qd  Other Orders: Pneumococcal Vaccine (16109) Admin 1st Vaccine (60454)  Patient Instructions: 1)  Please schedule a follow-up appointment in 6 months. Prescriptions: COREG 25 MG  TABS (CARVEDILOL) 1 by mouth bid  #180 x 3   Entered and Authorized by:   Tresa Garter MD   Signed by:   Tresa Garter MD on 11/09/2010   Method used:   Print then Give to Patient   RxID:   [id number] LEVOTHROID 100 MCG TABS (LEVOTHYROXINE SODIUM) 1 by mouth once daily for thyroid  #90 x 3   Entered and Authorized by:   Tresa Garter MD   Signed by:   Tresa Garter MD on 11/09/2010   Method used:   Print then Give to Patient   RxID:   [id number] ASACOL 400 MG  TBEC (MESALAMINE) 4 by mouth three times a day  #1080 x 3   Entered and Authorized by:   Tresa Garter MD   Signed by:   Tresa Garter MD on 11/09/2010   Method used:   Print then Give to Patient   RxID:   [id number]    Orders Added: 1)  TLB-BMP (Basic Metabolic Panel-BMET) [80048-METABOL] 2)  TLB-CBC Platelet - w/Differential [85025-CBCD] 3)  TLB-BNP (B-Natriuretic Peptide) [83880-BNPR] 4)  TLB-Hepatic/Liver Function Pnl [80076-HEPATIC] 5)  TLB-Lipid Panel [80061-LIPID] 6)  TLB-TSH (Thyroid Stimulating Hormone) [84443-TSH] 7)  TLB-Udip ONLY [81003-UDIP] 8)  TLB-PSA (Prostate Specific Antigen) [84153-PSA] 9)  Medicare -1st Annual Wellness Visit [G0438] 10)  Est. Patient Level IV [04540] 11)  Pneumococcal Vaccine [90732] 12)  Admin 1st Vaccine [90471]   Immunization History:  Influenza Immunization History:    Influenza:  historical (08/05/2010)  Immunizations Administered:  Pneumonia Vaccine:    Vaccine Type: Pneumovax    Site: left deltoid    Mfr: Merck    Dose: 0.5 ml    Route: IM    Given by: Lanier Prude, CMA(AAMA)    Exp. Date: 04/07/2012    Lot #: [id number]    VIS given: 11/03/09 version given November 09, 2010.   Immunization History:  Influenza Immunization History:    Influenza:  Historical (08/05/2010)  Immunizations Administered:  Pneumonia Vaccine:    Vaccine Type: Pneumovax    Site: left deltoid    Mfr: Merck    Dose: 0.5 ml    Route: IM    Given by: Lanier Prude, CMA(AAMA)    Exp. Date: 04/07/2012    Lot #: [id number]    VIS given: 11/03/09 version given November 09, 2010.

## 2011-01-27 ENCOUNTER — Encounter: Payer: Self-pay | Admitting: Internal Medicine

## 2011-02-04 NOTE — Letter (Signed)
Summary: Generic Letter  [facility]  [address] [place], [place] [address]   Phone: [phone]  Fax: [phone]         01/27/2011  Chris Stokes [address] [place], [place]  [address]  Dear Mr. Mayson,   Our office refilled your Asacol but noticed that it is time for you to make an appointment to follow-up with Dr. Marina Goodell.   Please call our office to schedule an apppointment at [phone].  Thank you for your attention to this matter and allowing [place] to take part in your care.     Sincerely,     Milford Cage, CMA  ([facility]/Dr. Yancey Flemings)

## 2011-02-08 ENCOUNTER — Ambulatory Visit (INDEPENDENT_AMBULATORY_CARE_PROVIDER_SITE_OTHER): Payer: Medicare Other | Admitting: Cardiology

## 2011-02-08 DIAGNOSIS — Z79899 Other long term (current) drug therapy: Secondary | ICD-10-CM

## 2011-02-08 DIAGNOSIS — I252 Old myocardial infarction: Secondary | ICD-10-CM

## 2011-02-08 DIAGNOSIS — E789 Disorder of lipoprotein metabolism, unspecified: Secondary | ICD-10-CM

## 2011-02-09 NOTE — Medication Information (Signed)
Summary: Asacol/[facility]  Asacol/[employer]   Imported By: Sherian Rein 02/01/2011 08:29:51  _____________________________________________________________________  External Attachment:    Type:   Image     Comment:   External Document

## 2011-03-16 ENCOUNTER — Ambulatory Visit (INDEPENDENT_AMBULATORY_CARE_PROVIDER_SITE_OTHER): Payer: Medicare Other | Admitting: Internal Medicine

## 2011-03-16 ENCOUNTER — Encounter: Payer: Self-pay | Admitting: Internal Medicine

## 2011-03-16 VITALS — BP 142/80 | HR 56 | Ht 70.5 in | Wt 199.0 lb

## 2011-03-16 DIAGNOSIS — K5190 Ulcerative colitis, unspecified, without complications: Secondary | ICD-10-CM

## 2011-03-16 DIAGNOSIS — K219 Gastro-esophageal reflux disease without esophagitis: Secondary | ICD-10-CM

## 2011-03-16 DIAGNOSIS — Z860100 Personal history of colon polyps, unspecified: Secondary | ICD-10-CM

## 2011-03-16 NOTE — Progress Notes (Signed)
HISTORY OF PRESENT ILLNESS:  Chris Stokes is a 77 y.o. male with the below list of medical problems who is followed in this office for left-sided ulcerative colitis, adenomatous colon polyps, and GERD. He presents today for routine followup. Last seen in the office in September of 2010. Subsequent EGD for GERD was unremarkable. Currently on Asacol 1600 mg 3 times a day for his colitis. On omeprazole 20 mg daily for GERD. He does report a recent flare of his colitis is manifested by increased frequency of bowel movements. This has improved. Currently reports himself to be at baseline. Review of laboratories from December 2011 were unremarkable except for mild thrombocytopenia, which he has had. In terms of GERD, symptoms well-controlled with PPI. He does not need a medication refill at this time. He will be due for surveillance colonoscopy around September 2013. His other medical problems are stable  REVIEW OF SYSTEMS:  All non-GI ROS negative except for allergies.  Past Medical History  Diagnosis Date  . CAD (coronary artery disease)     Dr. Patty Sermons  . Hyperlipidemia   . Hypertension   . Hypothyroidism   . Ulcerative colitis     Past Surgical History  Procedure Date  . Abdominal aortic aneurysm repair   . Coronary artery bypass graft     Social History Pilar Weigel  reports that he quit smoking about 44 years ago. His smoking use included Cigarettes. He has never used smokeless tobacco. He reports that he does not drink alcohol or use illicit drugs.  family history includes COPD in his brother; Colon cancer in his cousin; Hypertension in his sister; and Lymphoma in his sister.  Allergies  Allergen Reactions  . Simvastatin     REACTION: myalgia  . Sulfonamide Derivatives     REACTION: rash       PHYSICAL EXAMINATION:  Vital signs: BP 142/80  Pulse 56  Ht 5' 10.5" (1.791 m)  Wt 199 lb (90.266 kg)  BMI 28.15 kg/m2 General: Well-developed, well-nourished, no acute  distress HEENT: Sclerae are anicteric, conjunctiva pink. Oral mucosa intact Lungs: Clear Heart: Regular Abdomen: soft, nontender, nondistended, no obvious ascites, no peritoneal signs, normal bowel sounds. No organomegaly. Extremities: No edema Psychiatric: alert and oriented x3. Cooperative   ASSESSMENT:  #1. Left-sided ulcerative colitis. Currently stable on mesalamine.  #2. GERD. Stable on PPI   PLAN:  #1. Continue mesalamine 1600 mg 3 times a day  #2. Routine office followup in 1 year.  #3. Keep plans for surveillance colonoscopy around September 2013  #4. Continue PPI antireflux precautions

## 2011-03-16 NOTE — Patient Instructions (Signed)
Follow-up in 1 year. Please call if you need any prescriptions refilled.

## 2011-04-13 NOTE — Assessment & Plan Note (Signed)
[facility]                         GASTROENTEROLOGY OFFICE NOTE   Chris Stokes, [facility]                           MRN:          [id number]  DATE:03/21/2008                            DOB:          Apr 09, 1929    HISTORY:  Mr. Sato presents today for follow up regarding management of  his ulcerative colitis.  He was last evaluated February 14, 2008, at which  time he was having a flair of his left-sided ulcerative colitis.  The  flair was mild and somewhat improved on an adjusted dose of Asacol.  At  the time of that visit, his Asacol was increased from 1.6 g b.i.d. to  1.6 g t.i.d.  As well, we initiated Rowasa enemas at night.  He has been  compliant with this therapy.  He presents now to assess response to  therapies.  The patient states that he is feeling well and is completely  asymptomatic.  His appetite is good.  Bowel movements are normal without  bleeding or mucus.  No pain.  No fevers.   CURRENT MEDICATIONS:  Include:  1. Asacol 1.6 g p.o. t.i.d.  2. Rowasa enemas at night.  3. Carvedilol 100 mg daily.  4. Ramipril 10 mg daily.  5. Levothyroxine 0.15 mg daily.  6. Spironolactone 25 mg daily.  7. Zocor 8 mg daily.  8. Omega 3.  9. Baby aspirin.  10.Folic acid.  11.Loratadine 20 mg daily.  12.Ranitidine 150 mg daily.   PHYSICAL EXAMINATION:  Finds a well-appearing male in no acute distress.  He is alert and oriented.  Blood pressure is 108/68, heart rate 60 and regular.  His weight is 197  pounds.  HEENT:  Sclerae anicteric, conjunctivae are pink.  LUNGS:  Clear.  HEART:  Regular.  ABDOMEN:  Soft without tenderness, masses or hernia, good bowel sounds  heard.  EXTREMITIES:  Without edema.   IMPRESSION:  Left-sided ulcerative colitis.  Recent flair of disease  corrected with adjusted medical management.   RECOMMENDATIONS:  1. Discontinue Rowasa enemas.  2. Continue Asacol 1.6 g p.o. t.i.d.  3. Office follow up in about 3  months.     Wilhemina Bonito. Marina Goodell, MD  Electronically Signed    JNP/MedQ  DD: 03/21/2008  DT: 03/21/2008  Job #: [id number]   cc:   Georgina Quint. Plotnikov, MD  Cassell Clement, M.D.

## 2011-04-13 NOTE — Assessment & Plan Note (Signed)
[facility]                         GASTROENTEROLOGY OFFICE NOTE   Chris Stokes, Chris Stokes                           MRN:          [id number]  DATE:02/14/2008                            DOB:          1929-02-24    HISTORY:  Chris Stokes presents today with probable flare of his ulcerative  colitis.  He had been on Asacol 800 mg b.i.d. and doing well. His last  colonoscopy was performed in May of 2008.  At that time, mild left-sided  disease noted.  Also, diminutive colon polyp.  Followup in two years  recommended.  Patient reports doing well until at February, when he  developed an upset stomach.  Subsequently, problems with diarrhea and  symptoms reminiscent of ulcerative colitis flare.  Specifically, mucus  and mild discomfort.  He also had some mild bleeding.  He elected to  increase his Asacol from 800 mg b.i.d. to 1600 mg b.i.d.  Over time, he  has had gradual improvement in symptoms, though he is not quite back to  normal.  Currently describes more formed stools, still having mucus.  No  further bleeding.  No significant abdominal pain.   CURRENT MEDICATIONS INCLUDE:  1. Asacol 1.6 g b.i.d.  2. Carvedilol 100 mg daily.  3. Ramipril 10 mg daily.  4. Levothyroxine 0.15 mg daily.  5. Spironolactone 25 mg daily.  6. Zocor 80 mg daily.  7. Omega 3.  8. Baby aspirin.  9. Folic acid.  10.Loratadine 20 mg daily.  11.Ranitidine 150 mg daily.   PHYSICAL EXAM:  Well-appearing male, in no acute distress.  Blood pressure 146/76, heart rate 64, weight is 200.4 pounds.  HEENT:  Sclerae anicteric, conjunctivae pink, oral mucosa intact, no  adenopathy.  LUNGS:  Clear.  HEART:  Regular.  ABDOMEN:  Soft without tenderness, mass or hernia.  Good bowel sounds  heard.   IMPRESSION:  Left-sided ulcerative colitis with mild flare.  Some  improvement with increased dose of Asacol, though incomplete.   RECOMMENDATIONS:  1. Increase Asacol to 1.6 g p.o. t.i.d.  2.  Initiate Rowasa enemas one at night.  3. Office followup in about four weeks.     Wilhemina Bonito. Marina Goodell, MD  Electronically Signed    JNP/MedQ  DD: 02/14/2008  DT: 02/14/2008  Job #: [id number]   cc:   Georgina Quint. Plotnikov, MD  Cassell Clement, M.D.

## 2011-04-13 NOTE — Assessment & Plan Note (Signed)
[facility]                         GASTROENTEROLOGY OFFICE NOTE   ROURKE, MCQUITTY                           MRN:          [id number]  DATE:06/13/2007                            DOB:          10-Sep-1929    HISTORY:  Mr. Grim presents today for followup regarding management of  his left-sided ulcerative colitis.  He is currently maintained on Asacol  800 mg b.i.d., and is asymptomatic.  He underwent routine surveillance  Apr 26, 2007.  He was found to have a very mild patchy left-sided  colitis.  Right colon appeared grossly normal, as did the ileum.  He was  found to have a diminutive polyp in the cecum, which was removed and  found to be a simple tubular adenoma.  Biopsies of the right colon were  normal.  Biopsies of the transverse and descending colon revealed  colitis without dysplasia.  Rectosigmoid biopsies were unremarkable.  I  have reviewed his results and pathology today.   CURRENT MEDICATIONS:  Include:  1. Levoxyl 0.15 mg daily.  2. Aspirin 81 mg daily.  3. Folic acid.  4. Omega 3 fish oil.  5. Famotidine 10 mg b.i.d.  6. Loratadine 10 mg b.i.d.  7. Coreg 50 mg b.i.d.  8. Altace 10 mg daily.  9. Spironolactone 25 mg daily.  10.Asacol 800 mg b.i.d.   PHYSICAL EXAMINATION:  Well-appearing male in no acute distress.  He is  alert and oriented.  Blood pressure is 118/78.  Heart rate is 64.  Weight 203 pounds.  LUNGS:  Clear.  HEART:  Regular.  ABDOMEN:  Soft without tenderness or mass.  Good bowel sounds heard.  Small ventral hernia, which spontaneously reduces.   IMPRESSION:  1. Left-sided ulcerative colitis.  Mild changes on endoscopy.      Clinically asymptomatic.  2. Dimunitive tubular adenoma in the right colon remote from active      colitis.  3. Multiple general medical problems.   RECOMMENDATIONS:  1. Continue Asacol 800 mg p.o. b.i.d.  2. Plan followup colonoscopy in 2 years as previously recommended.  3. Interval  office followup for any questions or problems.  4. Ongoing general medical care with Dr. Posey Rea and Dr. Patty Sermons.     Wilhemina Bonito. Marina Goodell, MD  Electronically Signed    JNP/MedQ  DD: 06/13/2007  DT: 06/13/2007  Job #: [id number]   cc:   Cassell Clement, M.D.  Georgina Quint. Plotnikov, MD

## 2011-04-16 NOTE — Cardiovascular Report (Signed)
NAMEDURRELL, BARAJAS NO.:  [id number]   MEDICAL RECORD NO.:  [id number]                   PATIENT TYPE:  INP   LOCATION:  1824                                 FACILITY:  [facility]   PHYSICIAN:  Vesta Mixer, M.D.              DATE OF BIRTH:  [birth year]   DATE OF PROCEDURE:  03/25/2003  DATE OF DISCHARGE:                              CARDIAC CATHETERIZATION   HISTORY OF PRESENT ILLNESS:  The patient is a 77 year old gentleman with  known history of coronary artery disease.  He has a history of hypertension  and hyperlipidemia.  He presented to the emergency room with an hour and a  half history of chest pain.  He was brought to the catheterization lab after  an EKG revealed changes consistent with an inferior wall myocardial  infarction.   PROCEDURE:  Left heart catheterization with coronary angiography.   HEMODYNAMICS:  The left ventricular pressure was 106/17 with an aortic  pressure of 107/60.   ANGIOGRAPHY:  The left coronary artery has a distal 60% to 70% stenosis.   The left anterior descending artery is very heavily calcified.  The proximal  aspect of the vessel has minor luminal irregularities.  The mid LAD has a  70% stenosis just after the second diagonal branch.  The distal vessel has  minor luminal irregularities.   The first diagonal vessel is a relatively small vessel with a 60% to 70%  stenosis proximally.  The second diagonal vessel is a small-to-moderate-  sized vessel with 70% stenosis at its origin.   The left circumflex artery is a moderate vessel.  There is a 60% stenosis  proximally.   The ramus intermediate vessel is a moderate-to-large vessel.  There is a 70%  to 80% proximal stenosis.   The right coronary artery is large and dominant.  It is very ectatic.  It is  severely and diffusely diseased throughout its course.  There are multiple  ulcerated plaques associated with this ectasia.  There are multiple areas of  thrombus formation.  There is sluggish flow through the distal right femoral  artery with TIMI grade 1 to 2 flow down into the distal right coronary  artery, posterolateral segment artery, and PDA.   The left subclavian artery was found to have mild-to-moderate  irregularities.  The left vertebral artery was found to have a 30% to 40%  stenosis.  The IMA artery is basically unremarkable.   The left ventriculogram was performed in the 30 RAO position.  It reveals  mild-to-moderate left ventricular hypokinesis.  The ejection fraction was  around 45%.  There was mid inferior akinesis.  The remaining walls are  somewhat hypokinetic.   COMPLICATIONS:  None.   CONCLUSION:  Severe 3-vessel coronary artery disease.  He is status post  inferior wall myocardial infarction.  He has a subtotally occluded right  coronary artery but he  does have antegrade flow.  He has a known heart  attack of this  same distribution many years ago.  The patient is relatively pain-free.  I  suspect that he will need coronary artery bypass grafting.  I do not think  that angioplasty of this lesion will be all that successful.  We will  continue with aggressive medical management, including heparin and  Integrelin.                                               Vesta Mixer, M.D.    PJN/MEDQ  D:  03/25/2003  T:  03/26/2003  Job:  [id number]

## 2011-04-16 NOTE — Op Note (Signed)
Chris Stokes, GASBARRO                              ACCOUNT NO.:  [id number]   MEDICAL RECORD NO.:  [id number]                   PATIENT TYPE:  INP   LOCATION:  2304                                 FACILITY:  [facility]   PHYSICIAN:  Gwenith Daily. [name], M.D.            DATE OF BIRTH:  18-Jun-1929   DATE OF PROCEDURE:  03/26/2003  DATE OF DISCHARGE:                                 OPERATIVE REPORT   PREOPERATIVE DIAGNOSIS:  Acute myocardial infarction and coronary occlusive  disease.   POSTOPERATIVE DIAGNOSIS:  Acute myocardial infarction and coronary occlusive  disease.   OPERATION:  Coronary artery bypass grafting times four with left internal  mammary artery to the left anterior descending coronary artery, reverse  saphenous vein graft to the intermediate coronary artery, sequential reverse  saphenous vein graft to the posterior descending and posterior lateral  branches of the right coronary artery.  Endo vein harvesting.   SURGEON:  Gwenith Daily. [name], M.D.   FIRST ASSISTANT:  Maple Mirza, P.A.   BRIEF HISTORY:  The patient is a 77 year old male with known coronary  occlusive disease who had a myocardial infarction many years ago but has  been stable until the day of admission when he presented with prolonged  episode of chest pain and mild elevation of CK-MBs.  He underwent cardiac  catheterization by Dr. Elease Hashimoto, which demonstrated high grade stenosis of the  proximal LAD and intermediate 50% stenosis of the small bifurcating distal  circumflex.  The right coronary artery was large and ectatic with diffuse  disease and areas of stenosis of greater than 90% involving the posterior  descending and posterior lateral branches.  He had evidence of an inferior  hypokinesis and ejection fraction approximately 40%.  Because of the  patient's symptoms and significant disease coronary artery bypass grafting  was recommended.  The patient agreed and signed informed consent.   DESCRIPTION OF PROCEDURE:  With Swan-Ganz and arterial line monitors in  place, the patient underwent general endotracheal anesthesia without  incident.  The skin of the chest and legs was prepped with Betadine and  draped in the usual sterile manner.  Vein was initially harvested  endoscopically from the right thigh.  Additional vein was needed.  Just  below the knee, the vein bifurcated and became small, so an additional vein  was harvested from the right lower extremity with open technique.  Median  sternotomy was performed.  Left internal mammary artery was dissected down  as a pedicle graft.  The distal artery was divided and had good free flow.  Pericardium was opened.  Overall ventricular function appeared preserved but  there was evidence of inferior hypokinesis, both new and old scarring.  The  patient was  systemically heparinized.  The ascending aorta and the right atrium were  cannulated and aortic root vent cardioplegic needle was introduced into the  ascending aorta. The patient was placed  on cardiopulmonary bypass 2.4 liters  per minute per sqm.  Sites of anastomosis were selected and dissected out of  the epicardium.  The patient's body temperature was cooled to 30 degrees.  The aortic cross clamp was applied.  500 cc of cold blood potassium  cardioplegia was administered with rapid diastolic arrest of the heart.  Myocardial septal temperature was monitored throughout the cross clamp  period.  Attention was turned first to the posterior descending coronary  artery which was opened and diffusely diseased but admitted a 1.5 mm probe.  Using diamond type side-to-side anastomosis, segment of reverse saphenous  vein graft was carried out.  Distal extent of the same vein was then carried  on to the posterior lateral branch of the coronary artery which was opened  and admitted 1.5 mm probe.  Using a running 7-0 Prolene distal anastomosis  was performed.  Attention was then turned  to the bifurcating small branches  of the distal circumflex.  Initially we decided to bypass this; however, in  dissecting out the branches the vessel was extremely small, less than 1 mm  and thought not to be by passable.  The intermediate coronary was  intramyocardial.  It was opened and was a much larger vessel between 1.5 and  1.8 mm in size.  Using a running 7-0 Prolene distal anastomosis was  performed.  Attention was then turned to the left anterior descending  coronary artery which was intramyocardial for the proximal two-thirds of the  vessel.  The distal third of the vessel was opened and admitted a 1.5 mm  probe proximally and distally.  Using a running 8-0 Prolene left internal  mammary artery was anastomosed to the left anterior descending coronary  artery.  With release of the Bulldog on the mammary artery, there was  appropriate rise in myocardial  septal temperature.  The aortic cross clamp  was removed.  Total cross clamp time of 65 minutes.  Partial occlusion clamp  was placed on the ascending aorta.  Two punch aortotomies were performed.  Each of the two vein grafts were anastomosed to the ascending aorta.  Air  was evacuated from the grafts and partial occlusion clamp was removed.  Sites of anastomosis were inspected and free of bleeding.  The patient was  then ventilated and weaned from cardiopulmonary bypass on low dose Dopamine.  He remained hemodynamically stable.  He was decannulated in the usual  fashion.  Protamine sulfate was administered.  With operative field  hemostatic two atrial and two ventricular pacing wires were applied.  Graft  marker was applied.  Left pleural tube and two mediastinal tubes were left  in place.  Sternum was closed with #6 stainless steel wire.  Fascia was  closed with interrupted 0 Vicryl running and 3-0 Vicryl in the subcutaneous  tissue and 4-0 subcuticular stitch in the skin edges.  Dry dressings were applied.  Sponge and needle  count was reported as correct at the completion  of the procedure.  The patient was transferred to the surgical intensive  care unit for further postoperative care.                                               [name], M.D.    Tyson Babinski  D:  03/27/2003  T:  03/27/2003  Job:  [id number]   cc:  Cassell Clement, M.D.  [address]  [place]  [place] [address]  Fax: [phone]

## 2011-04-16 NOTE — Op Note (Signed)
NAMESHAYLON, ADEN                              ACCOUNT NO.:  [id number]   MEDICAL RECORD NO.:  [id number]                   PATIENT TYPE:  AMB   LOCATION:  ENDO                                 FACILITY:  [facility]   PHYSICIAN:  Danise Edge, M.D.                DATE OF BIRTH:  [birth year]   DATE OF PROCEDURE:  02/19/2004  DATE OF DISCHARGE:                                 OPERATIVE REPORT   PROCEDURE:  Colonoscopy.   INDICATIONS:  Mr. Calin Fantroy is a 77 year old male, born December 31, 1928.  Mr. Hutmacher was diagnosed with left-sided ulcerative proctocolitis May 1996.  His February 24, 1998 and January 30, 2001 proctocolonoscopy to the cecum revealed  inactive left-sided ulcerative proctocolitis.  Surveillance biopsies did not  reveal dysplasia.  Mr. Cuffe remains in remission taking Asacol.  Surveillance colonoscopy is scheduled today.   ENDOSCOPIST:  Danise Edge, M.D.   PREMEDICATION:  Versed 5 mg, Demerol 50 mg.   DESCRIPTION OF PROCEDURE:  After obtaining informed consent, Mr. Millea was  placed in the left lateral decubitus position.  I administered intravenous  Demerol and intravenous Versed to achieve conscious sedation for the  procedure.  The patient's blood pressure, oxygen saturation and cardiac  rhythm were monitored throughout the procedure and documented in the medical  record.   Anal inspection and digital rectal exam were normal.  The prostate was  nonnodular.  The Olympus adjustable pediatric colonoscope was introduced  into the rectum and advanced to the cecum.  A normal-appearing ileocecal  valve was intubated and the distal ileum inspected.  Colonic preparation for  the exam today was excellent.   Mr. Riemenschneider has inactive left-sided ulcerative proctocolitis extending to 50  cm from the anal verge.  Colonic mucosa from 50 cm from the anal verge to  the cecum was completely normal.  The distal ileum appeared normal.  Suveillance biopsies.  A total of 32 biopsies were  taken.  Four-quadrant  biopsies were taken every 10 cm starting in the cecum.  Eight biopsies were  placed in each pathology bottle.  Four pathology bottles were submitted and  labeled right colon, transverse colon, left colon and rectosigmoid.   ASSESSMENT:  Chronic left-sided ulcerative proctocolitis in remission.                                               Danise Edge, M.D.    MJ/MEDQ  D:  02/19/2004  T:  02/19/2004  Job:  [id number]   cc:   Donia Guiles, M.D.  [address] [place]  [place] [address]  Fax: [phone]

## 2011-04-16 NOTE — Procedures (Signed)
[place]  Patient:    Chris Stokes, Chris Stokes                           MRN: [id number] Proc. Date: 01/30/01 Attending:  Verlin Grills, M.D.                           Procedure Report  PROCEDURE:  Colonoscopy.  REFERRING PHYSICIAN:  Desma Maxim, M.D.  PROCEDURE INDICATION:  Chris Stokes (date of birth 08/22/1929) is a 77 year old male.  He has left-sided ulcerative proctocolitis, which was diagnosed in May 1996.  His February 24, 1998, surveillance colonoscopy to the cecum revealed mild left-sided proctocolitis extending from the rectum to the proximal descending colon.  From the splenic flexure to the cecum, the colonic mucosa appeared normal.  Random colonic biopsies were negative for dysplasia. Mr. Waterworth continues taking Asacol 1600 mg b.i.d.  This medication seems to control his symptoms of ulcerative proctocolitis.  I discussed with Mr. Gewirtz the complications associated with colonoscopy and polypectomy, including intestinal bleeding and intestinal perforation.  Mr. Lares has signed the operative permit.  ENDOSCOPIST:  Verlin Grills, M.D.  PREMEDICATION:  Fentanyl 50 mcg, Versed 5 mg.  ENDOSCOPE:  Olympus pediatric colonoscope.  DESCRIPTION OF PROCEDURE:  After obtaining informed consent, the patient was placed in the left lateral decubitus position.  I administered intravenous fentanyl and intravenous Versed to achieve conscious sedation for the procedure.  The patients blood pressure, oxygen saturation, and cardiac rhythm were monitored throughout the procedure and documented in the medical record.  Anal inspection was normal.  Digital rectal exam revealed a non-nodular prostate.  The Olympus pediatric video colonoscope was introduced into the rectum and under direct vision advanced to the cecum, identified by a normal-appearing ileocecal valve.  Colonic preparation for the exam today was excellent.  Rectum and  sigmoid colon:  There is mild proctocolitis extending from the anal verge to approximately 50 cm from the anal verge.  For the most part, the mucosal vascular pattern appears normal.  There are areas of loss in the mucosal vascular pattern and mild mucosal friability.  There are no deep ulcers and no aphthous-appearing ulcers.  Descending colon normal.  Splenic flexure normal.  Transverse colon normal.  Hepatic flexure normal.  Ascending colon normal.  Cecum and ileocecal valve normal.  Multiple biopsies were taken from the left colon to rule out dysplasia.  ASSESSMENT:  Chronic left-sided proctocolitis, first diagnosed in May 1996. Random left colonic biopsies were performed to rule out mucosal dysplasia. There is only mild residual proctosigmoiditis by exam today.  RECOMMENDATIONS:  Would do colonoscopy in approximately three years if random colonic biopsies do not reveal mucosal dysplasia. DD:  01/30/01 TD:  01/30/01 Job: [id number]

## 2011-04-16 NOTE — Consult Note (Signed)
Chris Stokes, Chris Stokes                              ACCOUNT NO.:  [id number]   MEDICAL RECORD NO.:  [id number]                   PATIENT TYPE:  INP   LOCATION:  2399                                 FACILITY:  [facility]   PHYSICIAN:  Gwenith Daily. [name], M.D.            DATE OF BIRTH:  [birth year]   DATE OF CONSULTATION:  03/25/2003  DATE OF DISCHARGE:                                   CONSULTATION   FOLLOWUP CARDIOLOGIST:  Dr. Cassell Clement.   PRIMARY CARE PHYSICIAN:  Dr. Donia Guiles.   REASON FOR CONSULTATION:  Severe three-vessel coronary artery disease with  left main obstruction and unstable angina.   HISTORY OF PRESENT ILLNESS:  The patient is a 78 year old male who gives a  history of a myocardial infarction almost 20 years ago, treated with  streptokinase, and subsequently had cardiac catheterization and was told he  had no obstruction.  He has done well over the years, until three weeks ago  when he had an episode of chest and neck discomfort at rest.  He took some  old nitroglycerin for this with relief.  Again today, while sitting, had the  same discomfort, took one nitroglycerin without relief and came to the  emergency room; in addition, he noted shortness of breath and diaphoresis.  He has had no prior angioplasty or prior surgery.  Cardiac risk factors  include hypertension and hyperlipidemia which is treated with medication.  He denies diabetes.  He is a remote smoker, having quit in 1968.  He has a  positive family history of myocardial infarction in his father at age 58 and  his mother died at age 32 without heart disease.  He has one brother who  died of coronary artery disease and lung cancer and two brothers who have  had bypass surgery.  He denies any previous stroke, although he has been  evaluated by his ophthalmologist for vague symptoms of right eye amaurosis,  but has no evidence of carotid disease on Duplex scan.  He denies renal  insufficiency.   PAST  MEDICAL HISTORY:  Past medical history is significant for  hypothyroidism.   PAST SURGICAL HISTORY:  Past surgical history includes multiple colon  biopsies for a history of ulcerative colitis, also resection of abdominal  aortic aneurysm by Dr. Quita Skye. Lawson in 1992.   SOCIAL HISTORY:  The patient is married.  He is employed at a truck service  business.   MEDICATIONS:  1. Aspirin 325 mg a day.  2. Synthroid 0.15 mg daily.  3. Zocor 20 mg a day.  4. Hydrochlorothiazide.  5. __________.  6. Accolate 2000 mg a day.   ALLERGIES:  Allergies include SULFA.   REVIEW OF SYSTEMS:  CARDIAC:  Cardiac review of systems is positive for  chest pain, exertional shortness of breath, increased weakness.  Denies  orthopnea, presyncope, syncope, palpitations, resting shortness of breath or  lower extremity edema.  GENERAL:  The patient has no constitutional  symptoms.  RESPIRATORY:  Denies hemoptysis.  Shortness of breath is related  with chest pain, otherwise, denies shortness of breath.  GASTROINTESTINAL:  Has had ulcerative colitis in the past with mucus stools and bloody stools,  currently is controlled and takes Accolate.  NEUROLOGIC:  Has no current  neurologic complaints.  He has noted vague changes in his vision in his  right eye transiently that was evaluated by his ophthalmologist.  MUSCULOSKELETAL:  Musculoskeletal significant for arthritis.  GU:  Denies  any urinary difficulty.  He has had a history of kidney stones.  Denies any  recent infections.  HEMATOLOGIC:  Denies easy bruisability.  ENDOCRINE:  Endocrine is positive for hypothyroidism.  PSYCHIATRIC:  Denies any  psychiatric history.  PERIPHERAL VASCULAR:  Peripheral vascular disease is  significant for abdominal aneurysm.  He has cramps in both legs but this is  at rest and at night.  He denies exertional claudication.   PHYSICAL EXAMINATION:  VITAL SIGNS:  Physical exam reveals blood pressure  100/38, pulse of 48,  respiratory rate is 18.  O2 saturation is 94%.  The  patient is 5-feet 11-inches tall and weighs 195 pounds; he has had a 5-pound  weight loss in the last several months.  GENERAL:  In general, the patient appears younger than his stated age.  HEENT:  Pupils are equal, round and reactive to light.  NECK:  Neck is without carotid bruits or jugular venous distention.  CHEST:  Chest is clear without wheezing.  CARDIAC:  Exam reveals a regular rate and rhythm without murmur or gallop.  ABDOMEN:  Exam shows a healed abdominal incision from aneurysm repair.  In  the midportion of the incision is a quarter-sized ventral hernia, easily  reducible.  EXTREMITIES:  Extremities are without deformity.  GU:  Normal male.  NEUROLOGIC:  Exam is grossly intact.  NODES:  He has no palpable cervical or supraclavicular nodes.  SKIN:  Skin is without ischemic changes.  VASCULAR:  Exam reveals 2+ femoral, popliteal, DP and PT pulses bilaterally  and appears to have adequate veins in both lower extremities.   LABORATORY FINDINGS:  Laboratory findings include hematocrit of 45% and  platelet count 152,000.   Cardiac catheterization films are reviewed.  The patient has 70% calcified  left main and LAD.  He has a large intermediate vessel with a 70-80%  stenosis, a smaller circumflex with 50-60% stenosis.  The right coronary  artery is a large ectatic vessel with a proximal 60%, mid 70%, distal 90%  with ulceration and a PD and PL branch that are diffusely diseased but  probably suitable for bypass.  Overall ejection fraction is depressed,  approximately 40%, with inferior hypokinesis.   IMPRESSION:  Patient with left main and three-vessel disease, probably  symptomatic because of the right coronary artery.  He presents with new  onset of unstable angina/pre-infarctional angina.  With his current anatomy and symptoms, coronary artery bypass graft is recommended.  The risks and  options were discussed with the  patient.  Because of the degree of  calcification, the number of lesions and their location, angioplasty and/or  stent would be inappropriate.  Risks of surgery including death, infection,  stroke, myocardial infarction, bleeding and blood transfusion all have been  discussed with the patient in detail and he is willing to proceed.  I made  arrangement to change the operating room schedule and perform his surgery on  March 26, 2003; the patient is agreeable with this approach.  He and his  family have had their questions answered.                                               [name], M.D.    Tyson Babinski  D:  03/25/2003  T:  03/26/2003  Job:  [id number]

## 2011-04-16 NOTE — Assessment & Plan Note (Signed)
[facility]                           GASTROENTEROLOGY OFFICE NOTE   NAME:Chris Stokes, Chris Stokes                           MRN:          [id number]  DATE:08/24/2006                            DOB:          1929/06/11    DATE OF VISIT:  August 24, 2006.   HISTORY:  This is a 77 year old white male with a history of hypertension,  coronary artery disease and hyperlipidemia who was evaluated in this office  June 28, 2005 when he established as a new patient for continuity care of  his ulcerative colitis.  His disease is left sided.  At the time of his  evaluation he was asymptomatic on 800 mg of Asacol twice daily.  He  continued on this dose of medication and was doing well until about one  month ago when he began to notice symptoms suggestive of a colitis flare.  In particular, he noticed increased mucous per rectum, increased intestinal  gas, and some occasional blood.  However, his stools were not loose, which  is generally the case.  He changed his dose of medication from 800 mg of  Asacol twice daily to 1600 mg of Asacol twice daily.  Within two weeks his  symptoms were markedly improved.  He is essentially back to baseline except  for the presence of some mucous and blood per rectum.  He denies nausea,  vomiting, fevers, weight loss or abdominal pain.  He is due for his next  surveillance colonoscopy in March of 2008.   CURRENT MEDICATIONS:  Include Vytorin, Levoxyl, aspirin, folic acid, omega 3  fish oil, famotidine, loratadine and Coreg.   ALLERGIES:  SULFA.   PHYSICAL EXAMINATION:  GENERAL APPEARANCE:  Physical examination finds a  well-appearing male in no acute distress.  VITAL SIGNS:  Blood pressure 130/70, heart rate 68, weight 198 pounds.  HEENT:  Sclerae anicteric.  Oral mucosa is intact.  LUNGS:  Clear.  HEART: Regular.  ABDOMEN:  Soft without tenderness, masses or hernias.  Good bowel sounds  heard.  EXTREMITIES:  Without edema.   IMPRESSION:  Possible recent mild flare of left sided colitis as manifested  by increased mucous and trivial blood per rectum.  Seemingly improved with  higher dose Asacol.   RECOMMENDATIONS:  1. Continue Asacol at current dosage.  2. Canasa suppositories 1 gram at night to use until mucous and bleeding      resolve.  3. Keep plans for scheduled colonoscopy this next Spring.  4. Should symptoms return, to any significant degree, he should contact      the office for interval evaluation.            ______________________________  Wilhemina Bonito. Eda Keys., MD      JNP/MedQ  DD:  08/24/2006  DT:  08/26/2006  Job #:  [id number]   cc:   Georgina Quint. Plotnikov, MD  Cassell Clement, M.D.

## 2011-04-16 NOTE — Discharge Summary (Signed)
Chris Stokes, Chris Stokes                              ACCOUNT NO.:  [id number]   MEDICAL RECORD NO.:  [id number]                   PATIENT TYPE:  INP   LOCATION:  2013                                 FACILITY:  [facility]   PHYSICIAN:  Gwenith Daily. [name], M.D.            DATE OF BIRTH:  06-30-1929   DATE OF ADMISSION:  03/25/2003  DATE OF DISCHARGE:                                 DISCHARGE SUMMARY   PRIMARY ADMISSION DIAGNOSIS:  Chest pain.   ADDITIONAL/DISCHARGE DIAGNOSES:  1. Severe three vessel coronary artery disease.  2. Unstable angina.  3. Hypothyroidism.  4. Ulcerative colitis.  5. Hyperlipidemia.   PROCEDURE:  1. Cardiac catheterization.  2. Coronary artery bypass grafting times four (left internal mammary artery     to the LAD; saphenous vein graft to the intermediate; sequential     saphenous vein graft to the posterior descending and posterolateral     coronaries).  3. Endoscopic vein harvest, right thigh.   HISTORY OF PRESENT ILLNESS:  The patient is a 77 year old male with a known  history of coronary artery disease. He is status post previous myocardial  infarction around 20 years ago. He had done well, managed medically since  that time, until approximately three weeks prior to this admission when he  developed chest and neck discomfort at rest. His pain was relieved with  Nitroglycerin. The pain recurred on the date of admission and was associated  with shortness of breath and diaphoresis. He was brought to the emergency  room at [facility] for further evaluation and treatment and was  ultimately admitted.   HOSPITAL COURSE:  He was ruled out for acute myocardial infarction. He did  undergo cardiac catheterization by Dr. Lourena Simmonds which showed severe three  vessel coronary artery disease which was not felt to be amenable to  percutaneous intervention. He was seen by Dr. Sheliah Plane and it was  agreed that surgical re-vascularization was his best course of  action at  this time. He was taken to the OR on March 26, 2003 and underwent coronary  artery bypass grafting times four as described in detail above. He tolerated  the procedure well and was transferred to the Surgical Intensive Care Unit  in stable condition. He was extubated shortly after surgery. He is  hemodynamically stable and doing well on postoperative day one. He was  maintained on Neo-Synephrine drip until late in the day on postoperative day  one at which time it was weaned and discontinued. He remained in the  Intensive Care Unit for overnight observation. By postoperative day two, he  was ready for transfer to the floor. Postoperatively, she has been quite  volume overloaded and has been diuresed with Lasix. Presently, he is still  about 8 pounds above his preoperative  weight. He has remained afebrile and  all vital signs have been stable. He has been somewhat anemic but  has not  required transfusion. His most recent hemoglobin and hematocrit are 8.3 and  24 respectively. He has maintained normal sinus rhythm. He has been  ambulating in the halls without difficulty and has been weaned off  supplemental O2. His surgical incision sites are healing well. It is felt  that if he continues to remain stable, he will be ready for discharge to  home on Apr 01, 2003.   DISCHARGE MEDICATIONS:  1. Enteric coated aspirin 81 mg each day.  2. Lasix 40 mg each day times one week.  3. K-Dur 20 mEq each day times one week.  4. Coreg 6.25 mg twice a day.  5. Folic acid 1 mg each day.  6. Synthroid 0.15 mg each day.  7. Zocor 20 mg each day.  8. Asacol 800 mg twice a day.  9. Tylox 1-2 every four hours as needed pain.   DISCHARGE INSTRUCTIONS:  He is to refrain from driving, heavy lifting or  strenuous activity. He may continue daily walking, use of his incentive  spirometer. He will continue a low fat and low sodium diet. He is asked to  shower daily and to clean his incisions with soap  and water.   FOLLOW UP:  He will call and make an appointment to see Dr. Patty Sermons in two  weeks. He will have a chest x-ray at that visit. He will then follow-up with  Dr. Sheliah Plane in three weeks and our office will call to schedule this  appointment. He should bring his chest x-ray for Dr. [name] to review.     Coral Ceo, P.A.                        [name], M.D.    GC/MEDQ  D:  03/31/2003  T:  03/31/2003  Job:  [id number]   cc:   Cassell Clement, M.D.  [address]  [place]  [place] [address]  Fax: [phone]   Donia Guiles, M.D.  [address] [place]  [place] [address]  Fax: [phone]

## 2011-04-19 ENCOUNTER — Other Ambulatory Visit: Payer: Self-pay | Admitting: *Deleted

## 2011-04-19 DIAGNOSIS — I119 Hypertensive heart disease without heart failure: Secondary | ICD-10-CM

## 2011-04-19 MED ORDER — CARVEDILOL 25 MG PO TABS: 12.5000 mg | ORAL_TABLET | Freq: Two times a day (BID) | ORAL | Status: DC

## 2011-04-19 MED ORDER — RAMIPRIL 10 MG PO TABS: 10.0000 mg | ORAL_TABLET | Freq: Every day | ORAL | Status: DC

## 2011-04-19 NOTE — Telephone Encounter (Signed)
Refilled meds per fax request.  

## 2011-04-23 ENCOUNTER — Other Ambulatory Visit: Payer: Self-pay | Admitting: Internal Medicine

## 2011-04-23 ENCOUNTER — Telehealth: Payer: Self-pay | Admitting: Internal Medicine

## 2011-04-23 MED ORDER — MESALAMINE 400 MG PO TBEC: 1600.0000 mg | DELAYED_RELEASE_TABLET | Freq: Three times a day (TID) | ORAL | Status: DC

## 2011-04-23 NOTE — Telephone Encounter (Signed)
Rx. Was sent to pharmacy.

## 2011-04-26 ENCOUNTER — Other Ambulatory Visit: Payer: Self-pay | Admitting: Internal Medicine

## 2011-05-26 ENCOUNTER — Encounter: Payer: Self-pay | Admitting: Internal Medicine

## 2011-05-27 ENCOUNTER — Encounter: Payer: Self-pay | Admitting: Internal Medicine

## 2011-05-27 ENCOUNTER — Other Ambulatory Visit (INDEPENDENT_AMBULATORY_CARE_PROVIDER_SITE_OTHER): Payer: Medicare Other

## 2011-05-27 ENCOUNTER — Ambulatory Visit (INDEPENDENT_AMBULATORY_CARE_PROVIDER_SITE_OTHER): Payer: Medicare Other | Admitting: Internal Medicine

## 2011-05-27 DIAGNOSIS — K5190 Ulcerative colitis, unspecified, without complications: Secondary | ICD-10-CM

## 2011-05-27 DIAGNOSIS — E785 Hyperlipidemia, unspecified: Secondary | ICD-10-CM

## 2011-05-27 DIAGNOSIS — Z79899 Other long term (current) drug therapy: Secondary | ICD-10-CM

## 2011-05-27 DIAGNOSIS — E039 Hypothyroidism, unspecified: Secondary | ICD-10-CM

## 2011-05-27 DIAGNOSIS — I10 Essential (primary) hypertension: Secondary | ICD-10-CM

## 2011-05-27 DIAGNOSIS — I714 Abdominal aortic aneurysm, without rupture, unspecified: Secondary | ICD-10-CM

## 2011-05-27 LAB — VITAMIN B12: Vitamin B-12: 1098 pg/mL — ABNORMAL HIGH (ref 211–911)

## 2011-05-27 LAB — COMPREHENSIVE METABOLIC PANEL
ALT: 17 U/L (ref 0–53)
AST: 24 U/L (ref 0–37)
Alkaline Phosphatase: 41 U/L (ref 39–117)
Creatinine, Ser: 1.4 mg/dL (ref 0.4–1.5)
Total Bilirubin: 0.8 mg/dL (ref 0.3–1.2)

## 2011-05-27 NOTE — Assessment & Plan Note (Signed)
On Rx 

## 2011-05-27 NOTE — Assessment & Plan Note (Signed)
On Crestor 

## 2011-05-27 NOTE — Assessment & Plan Note (Signed)
Controlled w/Asacol

## 2011-05-27 NOTE — Progress Notes (Signed)
  Subjective:    Patient ID: Chris Stokes, male    DOB: October 04, 1929, 77 y.o.   MRN: [id number]  HPI  The patient presents for a follow-up of  chronic hypertension, chronic dyslipidemia, CAD controlled with medicines. C/o occ tingling in toes    Review of Systems  Constitutional: Negative for appetite change, fatigue and unexpected weight change.  HENT: Negative for nosebleeds, congestion, sore throat, sneezing, trouble swallowing and neck pain.   Eyes: Negative for itching and visual disturbance.  Respiratory: Negative for cough.   Cardiovascular: Negative for chest pain, palpitations and leg swelling.  Gastrointestinal: Negative for nausea, diarrhea, blood in stool and abdominal distention.  Genitourinary: Negative for frequency and hematuria.  Musculoskeletal: Negative for back pain, joint swelling and gait problem.  Skin: Negative for rash.  Neurological: Negative for dizziness, tremors, speech difficulty and weakness.  Psychiatric/Behavioral: Negative for sleep disturbance, dysphoric mood and agitation. The patient is not nervous/anxious.        Objective:   Physical Exam  Constitutional: He is oriented to person, place, and time. He appears well-developed.  HENT:  Mouth/Throat: Oropharynx is clear and moist.  Eyes: Conjunctivae are normal. Pupils are equal, round, and reactive to light.  Neck: Normal range of motion. No JVD present. No thyromegaly present.  Cardiovascular: Normal rate, regular rhythm, normal heart sounds and intact distal pulses.  Exam reveals no gallop and no friction rub.   No murmur heard. Pulmonary/Chest: Effort normal and breath sounds normal. No respiratory distress. He has no wheezes. He has no rales. He exhibits no tenderness.  Abdominal: Soft. Bowel sounds are normal. He exhibits no distension and no mass. There is no tenderness. There is no rebound and no guarding.  Musculoskeletal: Normal range of motion. He exhibits no edema and no tenderness.    Lymphadenopathy:    He has no cervical adenopathy.  Neurological: He is alert and oriented to person, place, and time. He has normal reflexes. No cranial nerve deficit. He exhibits normal muscle tone. Coordination normal.  Skin: Skin is warm and dry. No rash noted.  Psychiatric: He has a normal mood and affect. His behavior is normal. Judgment and thought content normal.        Lab Results  Component Value Date   WBC 6.6 11/09/2010   HGB 14.2 11/09/2010   HCT 42.3 11/09/2010   PLT 106.0* 11/09/2010   CHOL 162 11/09/2010   TRIG 81.0 11/09/2010   HDL 30.10* 11/09/2010   ALT 13 11/09/2010   AST 20 11/09/2010   NA 141 11/09/2010   K 4.5 11/09/2010   CL 106 11/09/2010   CREATININE 1.3 11/09/2010   BUN 23 11/09/2010   CO2 28 11/09/2010   TSH 13.54* 11/09/2010   PSA 1.91 11/09/2010     Assessment & Plan:

## 2011-05-27 NOTE — Assessment & Plan Note (Addendum)
He was told no f/u needed

## 2011-06-24 ENCOUNTER — Other Ambulatory Visit: Payer: Self-pay | Admitting: *Deleted

## 2011-06-24 ENCOUNTER — Other Ambulatory Visit: Payer: Self-pay | Admitting: Internal Medicine

## 2011-06-24 MED ORDER — LEVOTHYROXINE SODIUM 100 MCG PO TABS: 100.0000 ug | ORAL_TABLET | Freq: Every day | ORAL | Status: DC

## 2011-07-05 ENCOUNTER — Other Ambulatory Visit: Payer: Self-pay | Admitting: *Deleted

## 2011-07-05 DIAGNOSIS — E785 Hyperlipidemia, unspecified: Secondary | ICD-10-CM

## 2011-07-05 MED ORDER — ROSUVASTATIN CALCIUM 10 MG PO TABS: 10.0000 mg | ORAL_TABLET | Freq: Every day | ORAL | Status: DC

## 2011-07-05 NOTE — Telephone Encounter (Signed)
Faxed signed Rx to [facility] [phone]

## 2011-07-20 ENCOUNTER — Ambulatory Visit (INDEPENDENT_AMBULATORY_CARE_PROVIDER_SITE_OTHER): Payer: Medicare Other | Admitting: Cardiology

## 2011-07-20 ENCOUNTER — Encounter: Payer: Self-pay | Admitting: Cardiology

## 2011-07-20 VITALS — BP 130/76 | HR 66 | Ht 71.0 in | Wt 199.0 lb

## 2011-07-20 DIAGNOSIS — I10 Essential (primary) hypertension: Secondary | ICD-10-CM

## 2011-07-20 DIAGNOSIS — I259 Chronic ischemic heart disease, unspecified: Secondary | ICD-10-CM

## 2011-07-20 DIAGNOSIS — E785 Hyperlipidemia, unspecified: Secondary | ICD-10-CM

## 2011-07-20 NOTE — Progress Notes (Signed)
Chris Stokes Date of Birth:  1929/05/20 [facility] / [facility] [address]   [address] [place], [place]  [address] [phone]           Fax   [phone]  History of Present Illness: This is a pleasant 77 year old gentleman who is seen for scheduled followup office visit he has known ischemic heart disease.  He had a previous myocardial infarction and subsequent coronary artery bypass graft surgery in 2004.  His last nuclear stress test was February 2008 and at that time he showed evidence of an old inferolateral scar but no reversible ischemia and his ejection fraction was 48%.  He had an echocardiogram on 04/20/05 which showed an ejection fraction of 55-60% and no wall motion abnormalities.  The patient does have impaired relaxation by Doppler.  He does not have any significant valve abnormalities.Since last visit he's been feeling well.  He's not having any chest pain.  He's not had to take any nitroglycerin.  He has very little dyspnea.  He has not been as careful with his diet and his weight is up 3 pounds.  The patient also has a history of ulcerative colitis and remains on Asacol 12 tablets a day from Dr. Marina Goodell.This has Kept The ulcerative colitis under control.   He has a history of essential hypertension hypothyroidism and hypercholesterolemia.  Current Outpatient Prescriptions  Medication Sig Dispense Refill  . aspirin 81 MG tablet Take 81 mg by mouth daily.        . carvedilol (COREG) 25 MG tablet Take 0.5 tablets (12.5 mg total) by mouth 2 (two) times daily with a meal.  90 tablet  3  . Cholecalciferol (VITAMIN D3) 2000 UNITS TABS Take 1 tablet by mouth 2 (two) times daily.        [name] Fexofenadine HCl (KLS ALLER-FEX PO) Take by mouth. 120 mg take one tablet by mouth daily       . folic acid (FOLVITE) 800 MCG tablet Take 800 mcg by mouth daily.        . Garlic 1000 MG CAPS Take 1 capsule by mouth 2 (two) times daily.        [name] levothyroxine (SYNTHROID, LEVOTHROID) 100 MCG  tablet Take 1 tablet (100 mcg total) by mouth daily.  90 tablet  1  . mesalamine (ASACOL) 400 MG EC tablet Take 4 tablets (1,600 mg total) by mouth 3 (three) times daily.  1080 tablet  3  . Omega-3 Fatty Acids (FISH OIL) 1000 MG CAPS Take 1,000 mg by mouth daily.       [name] omeprazole (PRILOSEC) 20 MG capsule Take 20 mg by mouth daily as needed.        . ramipril (ALTACE) 10 MG tablet Take 1 tablet (10 mg total) by mouth daily.  90 tablet  3  . rosuvastatin (CRESTOR) 10 MG tablet Take 1 tablet (10 mg total) by mouth daily.  90 tablet  3  . spironolactone (ALDACTONE) 25 MG tablet TAKE ONE TABLET BY MOUTH EVERY DAY  90 tablet  3    Allergies  Allergen Reactions  . Simvastatin     REACTION: myalgia  . Sulfonamide Derivatives     REACTION: rash    Patient Active Problem List  Diagnoses  . COLONIC POLYPS  . OTHER SPECIFIED ACQUIRED HYPOTHYROIDISM  . HYPOTHYROIDISM  . HYPERLIPIDEMIA  . HYPERKALEMIA  . HYPERTENSION  . Cor Athrscl-Uns Vessel  . ABDOMINAL AORTIC ANEURYSM  . GERD  . ULCERATIVE COLITIS  .  HEMATOCHEZIA  . BLADDER NECK OBSTRUCTION  . HAND PAIN  . COUGH  . HYPERGLYCEMIA  . OTHER DRUG ALLERGY  . NEPHROLITHIASIS, HX OF  . TOBACCO USE, QUIT    History  Smoking status  . Former Smoker  . Types: Cigarettes  . Quit date: 11/29/1966  Smokeless tobacco  . Never Used    History  Alcohol Use No    Family History  Problem Relation Age of Onset  . Colon cancer Cousin   . Cancer Cousin     colon  . Lymphoma Sister   . Hypertension Other   . Hypertension Father     Review of Systems: Constitutional: no fever chills diaphoresis or fatigue or change in weight.  Head and neck: no hearing loss, no epistaxis, no photophobia or visual disturbance. Respiratory: No cough, shortness of breath or wheezing. Cardiovascular: No chest pain peripheral edema, palpitations. Gastrointestinal: No abdominal distention, no abdominal pain, no change in bowel habits hematochezia or  melena. Genitourinary: No dysuria, no frequency, no urgency, no nocturia. Musculoskeletal:No arthralgias, no back pain, no gait disturbance or myalgias. Neurological: No dizziness, no headaches, no numbness, no seizures, no syncope, no weakness, no tremors. Hematologic: No lymphadenopathy, no easy bruising. Psychiatric: No confusion, no hallucinations, no sleep disturbance.    Physical Exam: Filed Vitals:   07/20/11 0841  BP: 130/76  Pulse: 66  The general appearance reveals a well-developed well-nourished gentleman in no distress.The head and neck exam reveals pupils equal and reactive.  Extraocular movements are full.  There is no scleral icterus.  The mouth and pharynx are normal.  The neck is supple.  The carotids reveal no bruits.  The jugular venous pressure is normal.  The  thyroid is not enlarged.  There is no lymphadenopathy.  The chest is clear to percussion and auscultation.  There are no rales or rhonchi.  Expansion of the chest is symmetrical.  The precordium is quiet.  The first heart sound is normal.  The second heart sound is physiologically split.  There is no murmur gallop rub or click.  There is no abnormal lift or heave.  The abdomen is soft and nontender.  The bowel sounds are normal.  The liver and spleen are not enlarged.  There are no abdominal masses.  There are no abdominal bruits.  Extremities reveal good pedal pulses.  There is no phlebitis or edema.  There is no cyanosis or clubbing.  Strength is normal and symmetrical in all extremities.  There is no lateralizing weakness.  There are no sensory deficits.  The skin is warm and dry.  There is no rash.     Assessment / Plan: Continue same medication.  Recheck in 4 months for followup office visit.  Needs to work harder on diet and weight loss.

## 2011-07-20 NOTE — Assessment & Plan Note (Signed)
The patient is not having any symptoms referable to his high blood pressure.  He denies dizziness or headaches.  His not having any side effects from his medication.

## 2011-07-20 NOTE — Assessment & Plan Note (Signed)
The patient has not been expressing any recurrent angina pectoris.  He has not had to take any sublingual nitroglycerin

## 2011-07-20 NOTE — Assessment & Plan Note (Signed)
The patient has a history of hypercholesterolemia.  He is tolerating low-dose rosuvastatin without side effects.  Blood tests today are pending.

## 2011-09-17 NOTE — Telephone Encounter (Signed)
This was done back in May.

## 2011-11-12 ENCOUNTER — Encounter: Payer: Self-pay | Admitting: Internal Medicine

## 2011-11-12 ENCOUNTER — Ambulatory Visit (INDEPENDENT_AMBULATORY_CARE_PROVIDER_SITE_OTHER): Payer: Medicare Other | Admitting: Internal Medicine

## 2011-11-12 DIAGNOSIS — I119 Hypertensive heart disease without heart failure: Secondary | ICD-10-CM

## 2011-11-12 DIAGNOSIS — R7309 Other abnormal glucose: Secondary | ICD-10-CM

## 2011-11-12 DIAGNOSIS — E785 Hyperlipidemia, unspecified: Secondary | ICD-10-CM

## 2011-11-12 DIAGNOSIS — I10 Essential (primary) hypertension: Secondary | ICD-10-CM

## 2011-11-12 MED ORDER — LEVOTHYROXINE SODIUM 100 MCG PO TABS: 100.0000 ug | ORAL_TABLET | Freq: Every day | ORAL | Status: DC

## 2011-11-12 MED ORDER — RAMIPRIL 10 MG PO TABS: 10.0000 mg | ORAL_TABLET | Freq: Every day | ORAL | Status: DC

## 2011-11-12 MED ORDER — SPIRONOLACTONE 25 MG PO TABS: 25.0000 mg | ORAL_TABLET | Freq: Every day | ORAL | Status: DC

## 2011-11-12 MED ORDER — ROSUVASTATIN CALCIUM 10 MG PO TABS: 10.0000 mg | ORAL_TABLET | Freq: Every day | ORAL | Status: DC

## 2011-11-12 MED ORDER — CARVEDILOL 25 MG PO TABS: 12.5000 mg | ORAL_TABLET | Freq: Two times a day (BID) | ORAL | Status: DC

## 2011-11-12 NOTE — Progress Notes (Signed)
  Subjective:    Patient ID: Chris Stokes, male    DOB: [birth year], 76 y.o.   MRN: [id number]  HPI  The patient presents for a follow-up of  chronic hypertension, chronic dyslipidemia, CAD controlled with medicines.     Review of Systems  Constitutional: Positive for fatigue. Negative for appetite change and unexpected weight change.  HENT: Negative for nosebleeds, congestion, sore throat, sneezing, trouble swallowing and neck pain.   Eyes: Negative for itching and visual disturbance.  Respiratory: Negative for cough.   Cardiovascular: Negative for chest pain, palpitations and leg swelling.  Gastrointestinal: Negative for nausea, diarrhea, blood in stool and abdominal distention.  Genitourinary: Negative for frequency and hematuria.  Musculoskeletal: Negative for back pain, joint swelling and gait problem.  Skin: Negative for rash.  Neurological: Negative for dizziness, tremors, speech difficulty and weakness.  Psychiatric/Behavioral: Negative for sleep disturbance, dysphoric mood and agitation. The patient is not nervous/anxious.        Objective:   Physical Exam  Constitutional: He is oriented to person, place, and time. He appears well-developed.  HENT:  Mouth/Throat: Oropharynx is clear and moist.  Eyes: Conjunctivae are normal. Pupils are equal, round, and reactive to light.  Neck: Normal range of motion. No JVD present. No thyromegaly present.  Cardiovascular: Normal rate, regular rhythm, normal heart sounds and intact distal pulses.  Exam reveals no gallop and no friction rub.   No murmur heard. Pulmonary/Chest: Effort normal and breath sounds normal. No respiratory distress. He has no wheezes. He has no rales. He exhibits no tenderness.  Abdominal: Soft. Bowel sounds are normal. He exhibits no distension and no mass. There is no tenderness. There is no rebound and no guarding.  Musculoskeletal: Normal range of motion. He exhibits no edema and no tenderness.  Lymphadenopathy:      He has no cervical adenopathy.  Neurological: He is alert and oriented to person, place, and time. He has normal reflexes. No cranial nerve deficit. He exhibits normal muscle tone. Coordination normal.  Skin: Skin is warm and dry. No rash noted.  Psychiatric: He has a normal mood and affect. His behavior is normal. Judgment and thought content normal.          Assessment & Plan:

## 2011-11-12 NOTE — Assessment & Plan Note (Signed)
Watching  

## 2011-11-12 NOTE — Assessment & Plan Note (Signed)
Continue with current prescription therapy as reflected on the Med list.  

## 2011-11-17 ENCOUNTER — Encounter: Payer: Self-pay | Admitting: *Deleted

## 2011-11-19 ENCOUNTER — Other Ambulatory Visit (INDEPENDENT_AMBULATORY_CARE_PROVIDER_SITE_OTHER): Payer: Medicare Other | Admitting: *Deleted

## 2011-11-19 ENCOUNTER — Ambulatory Visit (INDEPENDENT_AMBULATORY_CARE_PROVIDER_SITE_OTHER): Payer: Medicare Other | Admitting: Cardiology

## 2011-11-19 ENCOUNTER — Encounter: Payer: Self-pay | Admitting: Cardiology

## 2011-11-19 VITALS — BP 130/78 | HR 56 | Ht 71.0 in | Wt 193.0 lb

## 2011-11-19 DIAGNOSIS — E785 Hyperlipidemia, unspecified: Secondary | ICD-10-CM

## 2011-11-19 DIAGNOSIS — I259 Chronic ischemic heart disease, unspecified: Secondary | ICD-10-CM

## 2011-11-19 DIAGNOSIS — I10 Essential (primary) hypertension: Secondary | ICD-10-CM

## 2011-11-19 DIAGNOSIS — I2510 Atherosclerotic heart disease of native coronary artery without angina pectoris: Secondary | ICD-10-CM

## 2011-11-19 DIAGNOSIS — I119 Hypertensive heart disease without heart failure: Secondary | ICD-10-CM

## 2011-11-19 LAB — BASIC METABOLIC PANEL
CO2: 24 mEq/L (ref 19–32)
Chloride: 109 mEq/L (ref 96–112)
Creatinine, Ser: 1.3 mg/dL (ref 0.4–1.5)
Sodium: 140 mEq/L (ref 135–145)

## 2011-11-19 LAB — HEPATIC FUNCTION PANEL
Albumin: 3.9 g/dL (ref 3.5–5.2)
Alkaline Phosphatase: 51 U/L (ref 39–117)

## 2011-11-19 LAB — LIPID PANEL
Cholesterol: 113 mg/dL (ref 0–200)
LDL Cholesterol: 67 mg/dL (ref 0–99)
Triglycerides: 74 mg/dL (ref 0.0–149.0)
VLDL: 14.8 mg/dL (ref 0.0–40.0)

## 2011-11-19 NOTE — Patient Instructions (Addendum)
DR. Patty Sermons WANTS YOU TO RETURN IN 4 MONTHS(03/19/2012) WITH FASTING LABS AND AN EKG   LAB: HFP, LIP, BMET

## 2011-11-19 NOTE — Assessment & Plan Note (Signed)
The patient has not been experiencing any exertional chest pain.  His electrocardiogram today shows a pattern of an arm inferolateral myocardial infarction and the T waves have increased in depth and inversion since the previous tracing of 2007.  The patient has had no recent symptoms of angina.  We will plan to repeat an electrocardiogram at his next visit in 4 months.

## 2011-11-19 NOTE — Assessment & Plan Note (Signed)
The patient has not been experiencing any dizzy spells or headaches or other symptoms of high blood pressure.  His blood pressures have been remaining stable on current therapy

## 2011-11-19 NOTE — Progress Notes (Signed)
Chris Stokes Date of Birth:  01-Sep-1929 [facility] / [facility] [address]   [address] [place], [place]  [address] [phone]           Fax   [phone]  History of Present Illness: This pleasant 77 year old gentleman is seen for a scheduled four-month followup office visit.  He has a history of known ischemic heart disease.  He had a previous myocardial infarction and coronary artery bypass graft surgery in 2004.  He had a nuclear stress test in February 2008 which showed an old inferolateral scar but no reversible ischemia and his ejection fraction was 40%.  The patient has a history of impaired relaxation by echo and Doppler.  The echo in the past has not shown any valve abnormalities.  Patient has not been experiencing any recent chest pain or shortness of breath.  Current Outpatient Prescriptions  Medication Sig Dispense Refill  . aspirin 81 MG tablet Take 81 mg by mouth daily.        . carvedilol (COREG) 25 MG tablet Take 0.5 tablets (12.5 mg total) by mouth 2 (two) times daily with a meal.  90 tablet  3  . Cholecalciferol (VITAMIN D3) 2000 UNITS TABS Take 1 tablet by mouth 2 (two) times daily.        [name] Fexofenadine HCl (KLS ALLER-FEX PO) Take by mouth. 120 mg take one tablet by mouth daily       . folic acid (FOLVITE) 800 MCG tablet Take 800 mcg by mouth daily.        . Garlic 1000 MG CAPS Take 1 capsule by mouth 2 (two) times daily.        [name] levothyroxine (SYNTHROID, LEVOTHROID) 100 MCG tablet Take 1 tablet (100 mcg total) by mouth daily.  90 tablet  3  . mesalamine (ASACOL) 400 MG EC tablet Take 4 tablets (1,600 mg total) by mouth 3 (three) times daily.  1080 tablet  3  . Omega-3 Fatty Acids (FISH OIL) 1000 MG CAPS Take 1,000 mg by mouth daily.       [name] omeprazole (PRILOSEC) 20 MG capsule Take 20 mg by mouth daily as needed.        . ramipril (ALTACE) 10 MG tablet Take 1 tablet (10 mg total) by mouth daily.  90 tablet  3  . rosuvastatin (CRESTOR) 10 MG tablet  Take 1 tablet (10 mg total) by mouth daily.  90 tablet  3  . spironolactone (ALDACTONE) 25 MG tablet Take 1 tablet (25 mg total) by mouth daily.  90 tablet  3    Allergies  Allergen Reactions  . Simvastatin     REACTION: myalgia  . Sulfonamide Derivatives     REACTION: rash    Patient Active Problem List  Diagnoses  . COLONIC POLYPS  . OTHER SPECIFIED ACQUIRED HYPOTHYROIDISM  . HYPOTHYROIDISM  . HYPERLIPIDEMIA  . HYPERKALEMIA  . HYPERTENSION  . Cor Athrscl-Uns Vessel  . ABDOMINAL AORTIC ANEURYSM  . GERD  . ULCERATIVE COLITIS  . HEMATOCHEZIA  . BLADDER NECK OBSTRUCTION  . HAND PAIN  . COUGH  . HYPERGLYCEMIA  . OTHER DRUG ALLERGY  . NEPHROLITHIASIS, HX OF  . TOBACCO USE, QUIT  . Ischemic heart disease    History  Smoking status  . Former Smoker  . Types: Cigarettes  . Quit date: 11/29/1966  Smokeless tobacco  . Never Used    History  Alcohol Use No    Family History  Problem Relation Age of Onset  .  Colon cancer Cousin   . Cancer Cousin     colon  . Lymphoma Sister   . Hypertension Other   . Hypertension Father     Review of Systems: Constitutional: no fever chills diaphoresis or fatigue or change in weight.  Head and neck: no hearing loss, no epistaxis, no photophobia or visual disturbance. Respiratory: No cough, shortness of breath or wheezing. Cardiovascular: No chest pain peripheral edema, palpitations. Gastrointestinal: No abdominal distention, no abdominal pain, no change in bowel habits hematochezia or melena. Genitourinary: No dysuria, no frequency, no urgency, no nocturia. Musculoskeletal:No arthralgias, no back pain, no gait disturbance or myalgias. Neurological: No dizziness, no headaches, no numbness, no seizures, no syncope, no weakness, no tremors. Hematologic: No lymphadenopathy, no easy bruising. Psychiatric: No confusion, no hallucinations, no sleep disturbance.    Physical Exam: Filed Vitals:   11/19/11 0901  BP: 130/78    Pulse: 56   the general appearance reveals a well-developed well-nourished gentleman in no distress.Pupils equal and reactive.   Extraocular Movements are full.  There is no scleral icterus.  The mouth and pharynx are normal.  The neck is supple.  The carotids reveal no bruits.  The jugular venous pressure is normal.  The thyroid is not enlarged.  There is no lymphadenopathy.  The chest is clear to percussion and auscultation. There are no rales or rhonchi. Expansion of the chest is symmetrical.  The precordium is quiet.  The first heart sound is normal.  The second heart sound is physiologically split.  There is no murmur gallop rub or click.  There is no abnormal lift or heave.  The abdomen is soft and nontender. Bowel sounds are normal. The liver and spleen are not enlarged. There Are no abdominal masses. There are no bruits.  The pedal pulses are good.  There is no phlebitis or edema.  There is no cyanosis or clubbing. Strength is normal and symmetrical in all extremities.  There is no lateralizing weakness.  There are no sensory deficits.  The skin is warm and dry.  There is no rash.  EKG shows sinus bradycardia with first-degree block and a pattern of an old inferolateral myocardial infarction with T-wave inversions which have increased since 2007  Assessment / Plan: Blood work was drawn today.  Recheck in 4 months for followup office visit and EKG and fasting lab work

## 2011-11-19 NOTE — Assessment & Plan Note (Signed)
The patient has a history of hypercholesterolemia.  He is not experiencing any myalgias.  He is on Crestor 10 mg daily.  Blood work today is pending.

## 2011-12-02 ENCOUNTER — Telehealth: Payer: Self-pay | Admitting: *Deleted

## 2011-12-02 NOTE — Telephone Encounter (Signed)
Advised of labs 

## 2011-12-02 NOTE — Telephone Encounter (Signed)
Message copied by Burnell Blanks on Thu Dec 02, 2011  2:47 PM ------      Message from: Cassell Clement      Created: Sat Nov 20, 2011 11:44 AM       Please report.  The labs are stable.  Continue same meds.  Continue careful diet.  BUN higher.  Drink more water.

## 2012-03-23 ENCOUNTER — Encounter: Payer: Self-pay | Admitting: Cardiology

## 2012-03-23 ENCOUNTER — Ambulatory Visit (INDEPENDENT_AMBULATORY_CARE_PROVIDER_SITE_OTHER): Payer: Medicare Other | Admitting: Cardiology

## 2012-03-23 VITALS — BP 110/78 | HR 66 | Ht 71.0 in | Wt 198.0 lb

## 2012-03-23 DIAGNOSIS — E785 Hyperlipidemia, unspecified: Secondary | ICD-10-CM

## 2012-03-23 DIAGNOSIS — I119 Hypertensive heart disease without heart failure: Secondary | ICD-10-CM

## 2012-03-23 DIAGNOSIS — I493 Ventricular premature depolarization: Secondary | ICD-10-CM

## 2012-03-23 DIAGNOSIS — E7800 Pure hypercholesterolemia, unspecified: Secondary | ICD-10-CM

## 2012-03-23 DIAGNOSIS — I4949 Other premature depolarization: Secondary | ICD-10-CM

## 2012-03-23 DIAGNOSIS — I259 Chronic ischemic heart disease, unspecified: Secondary | ICD-10-CM

## 2012-03-23 DIAGNOSIS — I10 Essential (primary) hypertension: Secondary | ICD-10-CM

## 2012-03-23 NOTE — Assessment & Plan Note (Signed)
The patient has not been experiencing any exertional chest pain.  He does have occasional intrascapular pain if he stands in addition to long doing household chores.  The discomfort resolves if he sits down and rests.  Occasionally also if he is hunched over on his riding lawnmower he will feel a similar discomfort.  He does not have any chest pain or intrascapular pain with exertion such as walking.

## 2012-03-23 NOTE — Assessment & Plan Note (Signed)
The patient has a history of dyslipidemia and is on Crestor.  He is not having any myalgias from the Crestor.  He will be getting his blood work checked in June at his primary care physician

## 2012-03-23 NOTE — Patient Instructions (Signed)
Your physician recommends that you continue on your current medications as directed. Please refer to the Current Medication list given to you today.  Your physician wants you to follow-up in: 6 months. You will receive a reminder letter in the mail two months in advance. If you don't receive a letter, please call our office to schedule the follow-up appointment.  

## 2012-03-23 NOTE — Progress Notes (Signed)
Chris Stokes Date of Birth:  1929-05-15 [facility] [address] [place], [place]  [address] [phone]         Fax   [phone]  History of Present Illness: This pleasant 78 year old gentleman is seen for a scheduled followup visit.  He does have a history of known ischemic heart disease.  He had a previous myocardial infarction and in 2004 he underwent coronary artery bypass graft surgery.  In February 2008 he had an nuclear stress test which showed an old inferolateral scar but no reversible ischemia.  His ejection fraction was 40%.  His echocardiogram in the past has shown impaired relaxation.  He does not have any significant valvular heart disease by echo.  Current Outpatient Prescriptions  Medication Sig Dispense Refill  . aspirin 81 MG tablet Take 81 mg by mouth daily.        . carvedilol (COREG) 25 MG tablet Take 0.5 tablets (12.5 mg total) by mouth 2 (two) times daily with a meal.  90 tablet  3  . Cholecalciferol (VITAMIN D3) 2000 UNITS TABS Take 1 tablet by mouth 2 (two) times daily.        [name] Fexofenadine HCl (KLS ALLER-FEX PO) Take by mouth. 120 mg take one tablet by mouth daily       . folic acid (FOLVITE) 800 MCG tablet Take 800 mcg by mouth daily.        . Garlic 1000 MG CAPS Take 1 capsule by mouth 2 (two) times daily.        [name] levothyroxine (SYNTHROID, LEVOTHROID) 100 MCG tablet Take 1 tablet (100 mcg total) by mouth daily.  90 tablet  3  . mesalamine (ASACOL) 400 MG EC tablet Take 4 tablets (1,600 mg total) by mouth 3 (three) times daily.  1080 tablet  3  . Omega-3 Fatty Acids (FISH OIL) 1000 MG CAPS Take 1,000 mg by mouth daily.       [name] omeprazole (PRILOSEC) 20 MG capsule Take 20 mg by mouth daily as needed.        . ramipril (ALTACE) 10 MG tablet Take 1 tablet (10 mg total) by mouth daily.  90 tablet  3  . rosuvastatin (CRESTOR) 10 MG tablet Take 1 tablet (10 mg total) by mouth daily.  90 tablet  3  . spironolactone (ALDACTONE) 25 MG tablet Take 1  tablet (25 mg total) by mouth daily.  90 tablet  3    Allergies  Allergen Reactions  . Simvastatin     REACTION: myalgia  . Sulfonamide Derivatives     REACTION: rash    Patient Active Problem List  Diagnoses  . COLONIC POLYPS  . OTHER SPECIFIED ACQUIRED HYPOTHYROIDISM  . HYPOTHYROIDISM  . HYPERLIPIDEMIA  . HYPERKALEMIA  . HYPERTENSION  . Cor Athrscl-Uns Vessel  . ABDOMINAL AORTIC ANEURYSM  . GERD  . ULCERATIVE COLITIS  . HEMATOCHEZIA  . BLADDER NECK OBSTRUCTION  . HAND PAIN  . COUGH  . HYPERGLYCEMIA  . OTHER DRUG ALLERGY  . NEPHROLITHIASIS, HX OF  . TOBACCO USE, QUIT  . Ischemic heart disease    History  Smoking status  . Former Smoker  . Types: Cigarettes  . Quit date: 11/29/1966  Smokeless tobacco  . Never Used    History  Alcohol Use No    Family History  Problem Relation Age of Onset  . Colon cancer Cousin   . Cancer Cousin     colon  . Lymphoma Sister   . Hypertension Other   .  Hypertension Father     Review of Systems: Constitutional: no fever chills diaphoresis or fatigue or change in weight.  Head and neck: no hearing loss, no epistaxis, no photophobia or visual disturbance. Respiratory: No cough, shortness of breath or wheezing. Cardiovascular: No chest pain peripheral edema, palpitations. Gastrointestinal: No abdominal distention, no abdominal pain, no change in bowel habits hematochezia or melena. Genitourinary: No dysuria, no frequency, no urgency, no nocturia. Musculoskeletal:No arthralgias, no back pain, no gait disturbance or myalgias. Neurological: No dizziness, no headaches, no numbness, no seizures, no syncope, no weakness, no tremors. Hematologic: No lymphadenopathy, no easy bruising. Psychiatric: No confusion, no hallucinations, no sleep disturbance.    Physical Exam: Filed Vitals:   03/23/12 1516  BP: 110/78  Pulse: 66   the general appearance reveals a well-developed well-nourished gentleman in no distress.The head and  neck exam reveals pupils equal and reactive.  Extraocular movements are full.  There is no scleral icterus.  The mouth and pharynx are normal.  The neck is supple.  The carotids reveal no bruits.  The jugular venous pressure is normal.  The  thyroid is not enlarged.  There is no lymphadenopathy.  The chest is clear to percussion and auscultation.  There are no rales or rhonchi.  Expansion of the chest is symmetrical.  The precordium is quiet.  The first heart sound is normal.  The second heart sound is physiologically split.  There is no murmur gallop rub or click.  There is no abnormal lift or heave.  The abdomen is soft and nontender.  The bowel sounds are normal.  The liver and spleen are not enlarged.  There are no abdominal masses.  There are no abdominal bruits.  Extremities reveal good pedal pulses.  There is no phlebitis or edema.  There is no cyanosis or clubbing.  Strength is normal and symmetrical in all extremities.  There is no lateralizing weakness.  There are no sensory deficits.  The skin is warm and dry.  There is no rash.  EKG today shows normal sinus rhythm with occasional interpolated PVC.  There is a pattern of an old inferolateral myocardial infarction.  The tracing is unchanged since 08/25/06   Assessment / Plan: Continue same medication and be rechecked in 6 months for followup office visit

## 2012-03-23 NOTE — Assessment & Plan Note (Signed)
The patient has not had any dizzy spells.  No headaches.  No syncope.  Blood pressure has been staying in a satisfactory range.

## 2012-05-18 ENCOUNTER — Ambulatory Visit: Payer: Medicare Other | Admitting: Internal Medicine

## 2012-06-02 ENCOUNTER — Encounter: Payer: Self-pay | Admitting: Internal Medicine

## 2012-06-02 ENCOUNTER — Telehealth: Payer: Self-pay | Admitting: Internal Medicine

## 2012-06-02 ENCOUNTER — Ambulatory Visit (INDEPENDENT_AMBULATORY_CARE_PROVIDER_SITE_OTHER): Payer: Medicare Other | Admitting: Internal Medicine

## 2012-06-02 VITALS — BP 120/80 | HR 80 | Temp 97.3°F | Resp 16 | Wt 198.0 lb

## 2012-06-02 DIAGNOSIS — E039 Hypothyroidism, unspecified: Secondary | ICD-10-CM

## 2012-06-02 DIAGNOSIS — K5190 Ulcerative colitis, unspecified, without complications: Secondary | ICD-10-CM

## 2012-06-02 DIAGNOSIS — K219 Gastro-esophageal reflux disease without esophagitis: Secondary | ICD-10-CM

## 2012-06-02 DIAGNOSIS — I10 Essential (primary) hypertension: Secondary | ICD-10-CM

## 2012-06-02 DIAGNOSIS — I119 Hypertensive heart disease without heart failure: Secondary | ICD-10-CM

## 2012-06-02 DIAGNOSIS — E785 Hyperlipidemia, unspecified: Secondary | ICD-10-CM

## 2012-06-02 DIAGNOSIS — Z79899 Other long term (current) drug therapy: Secondary | ICD-10-CM

## 2012-06-02 LAB — VITAMIN B12: Vitamin B-12: 610 pg/mL (ref 211–911)

## 2012-06-02 LAB — BASIC METABOLIC PANEL
BUN: 39 mg/dL — ABNORMAL HIGH (ref 6–23)
Creatinine, Ser: 1.7 mg/dL — ABNORMAL HIGH (ref 0.4–1.5)
GFR: 42.8 mL/min — ABNORMAL LOW (ref >60.00)
Glucose, Bld: 93 mg/dL (ref 70–99)
Potassium: 5.1 mEq/L (ref 3.5–5.1)

## 2012-06-02 LAB — HEPATIC FUNCTION PANEL
AST: 19 U/L (ref 0–37)
Bilirubin, Direct: 0.1 mg/dL (ref 0.0–0.3)
Total Bilirubin: 0.7 mg/dL (ref 0.3–1.2)

## 2012-06-02 LAB — LIPID PANEL
HDL: 36.3 mg/dL — ABNORMAL LOW (ref >39.00)
Total CHOL/HDL Ratio: 4
VLDL: 22.4 mg/dL (ref 0.0–40.0)

## 2012-06-02 LAB — TSH: TSH: 18.6 u[IU]/mL — ABNORMAL HIGH (ref 0.35–5.50)

## 2012-06-02 MED ORDER — MESALAMINE 400 MG PO TBEC: 1600.0000 mg | DELAYED_RELEASE_TABLET | Freq: Three times a day (TID) | ORAL | Status: DC

## 2012-06-02 MED ORDER — RAMIPRIL 10 MG PO TABS: 10.0000 mg | ORAL_TABLET | Freq: Every day | ORAL | Status: DC

## 2012-06-02 MED ORDER — LEVOTHYROXINE SODIUM 100 MCG PO TABS: 100.0000 ug | ORAL_TABLET | Freq: Every day | ORAL | Status: DC

## 2012-06-02 MED ORDER — LEVOTHYROXINE SODIUM 137 MCG PO TABS: 137.0000 ug | ORAL_TABLET | Freq: Every day | ORAL | Status: DC

## 2012-06-02 MED ORDER — ROSUVASTATIN CALCIUM 10 MG PO TABS: 10.0000 mg | ORAL_TABLET | Freq: Every day | ORAL | Status: DC

## 2012-06-02 MED ORDER — CARVEDILOL 25 MG PO TABS: 12.5000 mg | ORAL_TABLET | Freq: Two times a day (BID) | ORAL | Status: DC

## 2012-06-02 NOTE — Assessment & Plan Note (Signed)
Continue with current prescription therapy as reflected on the Med list.  

## 2012-06-02 NOTE — Progress Notes (Signed)
   Subjective:    Patient ID: Chris Stokes, male    DOB: [birth year], 78 y.o.   MRN: [id number]  HPI  The patient presents for a follow-up of  chronic hypertension, colitis, chronic dyslipidemia, CAD controlled with medicines.   BP Readings from Last 3 Encounters:  06/02/12 120/80  03/23/12 110/78  11/19/11 130/78   Wt Readings from Last 3 Encounters:  06/02/12 198 lb (89.812 kg)  03/23/12 198 lb (89.812 kg)  11/19/11 193 lb (87.544 kg)      Review of Systems  Constitutional: Positive for fatigue. Negative for appetite change and unexpected weight change.  HENT: Negative for nosebleeds, congestion, sore throat, sneezing, trouble swallowing and neck pain.   Eyes: Negative for itching and visual disturbance.  Respiratory: Negative for cough.   Cardiovascular: Negative for chest pain, palpitations and leg swelling.  Gastrointestinal: Negative for nausea, diarrhea, blood in stool and abdominal distention.  Genitourinary: Negative for frequency and hematuria.  Musculoskeletal: Negative for back pain, joint swelling and gait problem.  Skin: Negative for rash.  Neurological: Negative for dizziness, tremors, speech difficulty and weakness.  Psychiatric/Behavioral: Negative for disturbed wake/sleep cycle, dysphoric mood and agitation. The patient is not nervous/anxious.        Objective:   Physical Exam  Constitutional: He is oriented to person, place, and time. He appears well-developed.  HENT:  Mouth/Throat: Oropharynx is clear and moist.  Eyes: Conjunctivae are normal. Pupils are equal, round, and reactive to light.  Neck: Normal range of motion. No JVD present. No thyromegaly present.  Cardiovascular: Normal rate, regular rhythm, normal heart sounds and intact distal pulses.  Exam reveals no gallop and no friction rub.   No murmur heard. Pulmonary/Chest: Effort normal and breath sounds normal. No respiratory distress. He has no wheezes. He has no rales. He exhibits no tenderness.    Abdominal: Soft. Bowel sounds are normal. He exhibits no distension and no mass. There is no tenderness. There is no rebound and no guarding.  Musculoskeletal: Normal range of motion. He exhibits no edema and no tenderness.  Lymphadenopathy:    He has no cervical adenopathy.  Neurological: He is alert and oriented to person, place, and time. He has normal reflexes. No cranial nerve deficit. He exhibits normal muscle tone. Coordination normal.  Skin: Skin is warm and dry. No rash noted.  Psychiatric: He has a normal mood and affect. His behavior is normal. Judgment and thought content normal.     Lab Results  Component Value Date   WBC 6.6 11/09/2010   HGB 14.2 11/09/2010   HCT 42.3 11/09/2010   PLT 106.0* 11/09/2010   GLUCOSE 98 11/19/2011   CHOL 113 11/19/2011   TRIG 74.0 11/19/2011   HDL 31.00* 11/19/2011   LDLCALC 67 11/19/2011   ALT 19 11/19/2011   AST 23 11/19/2011   NA 140 11/19/2011   K 4.5 11/19/2011   CL 109 11/19/2011   CREATININE 1.3 11/19/2011   BUN 29* 11/19/2011   CO2 24 11/19/2011   TSH 13.54* 11/09/2010   PSA 1.91 11/09/2010        Assessment & Plan:

## 2012-06-02 NOTE — Telephone Encounter (Signed)
Stacey, please, inform patient that all labs are normal except for abn TSH Start Levothyrox at 137 mcg/d TSH in 6 wks Thx

## 2012-06-05 MED ORDER — CARVEDILOL 25 MG PO TABS: 12.5000 mg | ORAL_TABLET | Freq: Two times a day (BID) | ORAL | Status: DC

## 2012-06-05 MED ORDER — LEVOTHYROXINE SODIUM 137 MCG PO TABS: 137.0000 ug | ORAL_TABLET | Freq: Every day | ORAL | Status: DC

## 2012-06-05 MED ORDER — SPIRONOLACTONE 25 MG PO TABS: 25.0000 mg | ORAL_TABLET | Freq: Every day | ORAL | Status: DC

## 2012-06-05 NOTE — Telephone Encounter (Signed)
Left mess for patient to call back.  

## 2012-06-05 NOTE — Telephone Encounter (Signed)
Pt informed

## 2012-07-27 ENCOUNTER — Encounter: Payer: Self-pay | Admitting: Internal Medicine

## 2012-07-30 ENCOUNTER — Ambulatory Visit: Payer: Medicare Other

## 2012-07-30 ENCOUNTER — Ambulatory Visit (INDEPENDENT_AMBULATORY_CARE_PROVIDER_SITE_OTHER): Payer: Medicare Other | Admitting: Family Medicine

## 2012-07-30 VITALS — BP 129/71 | HR 52 | Temp 97.5°F | Resp 16 | Ht 69.75 in | Wt 192.0 lb

## 2012-07-30 DIAGNOSIS — M79646 Pain in unspecified finger(s): Secondary | ICD-10-CM

## 2012-07-30 DIAGNOSIS — M79609 Pain in unspecified limb: Secondary | ICD-10-CM

## 2012-07-30 DIAGNOSIS — S62509A Fracture of unspecified phalanx of unspecified thumb, initial encounter for closed fracture: Secondary | ICD-10-CM

## 2012-07-30 NOTE — Progress Notes (Signed)
This 78 year old gentleman crushed his right thumb in a car door 5 days ago.There was pain and swelling initially, although the pain is minimal now.  The cuticle has continued to swell and elevate the nail with a hematoma.  Objective:  NAD Right thumb: Diffuse cuticle ecchymosis and swelling, although the distal 1/3 of the nail is elevated.  ROM is normal [facility] reading (PRIMARY) by  Dr. Dellia Nims thumb  Small ND fx distal phalanx  Assessment:  ND fx with subungual hematoma  Plan:  Soft splint x 3 weeks Recheck prn.

## 2012-08-29 ENCOUNTER — Encounter: Payer: Self-pay | Admitting: Internal Medicine

## 2012-08-29 ENCOUNTER — Telehealth: Payer: Self-pay | Admitting: Internal Medicine

## 2012-08-29 ENCOUNTER — Ambulatory Visit (INDEPENDENT_AMBULATORY_CARE_PROVIDER_SITE_OTHER): Payer: Medicare Other | Admitting: Internal Medicine

## 2012-08-29 VITALS — BP 160/98 | HR 60 | Ht 71.0 in | Wt 191.2 lb

## 2012-08-29 DIAGNOSIS — Z860100 Personal history of colon polyps, unspecified: Secondary | ICD-10-CM

## 2012-08-29 DIAGNOSIS — K219 Gastro-esophageal reflux disease without esophagitis: Secondary | ICD-10-CM

## 2012-08-29 DIAGNOSIS — K602 Anal fissure, unspecified: Secondary | ICD-10-CM

## 2012-08-29 DIAGNOSIS — K5100 Ulcerative (chronic) pancolitis without complications: Secondary | ICD-10-CM

## 2012-08-29 DIAGNOSIS — R197 Diarrhea, unspecified: Secondary | ICD-10-CM

## 2012-08-29 MED ORDER — DILTIAZEM GEL 2 %: CUTANEOUS | Status: DC

## 2012-08-29 NOTE — Progress Notes (Signed)
HISTORY OF PRESENT ILLNESS:  Chris Stokes is a 78 y.o. male with a history of coronary artery disease, hyperlipidemia, hypertension, hypothyroidism, and left-sided ulcerative colitis. He also has a history of adenomatous colon polyps and GERD. He was last seen April 2012 for routine followup. See that dictation. His last colonoscopy was performed in September of 2010. At that time he was found to have mild left-sided ulcerative colitis both endoscopically and microscopically. No dysplasia. Routine followup in 3 years recommended. In terms of his colitis, he continues on Asacol 1.6 g 3 times a day. He is not having any active colitis symptoms. He actually describes his bowels be somewhat constipated and with difficult bowel movements will experience rectal pain and red blood on the tissue. In terms of GERD, he takes omeprazole 20 mg daily. He denies active reflux symptoms. No problems with dysphagia. He did undergo screening upper endoscopy in October of 2010. This was normal except for a small hiatal hernia. He mentions to me 3 episodes of episodic flushing associated with abdominal fullness followed by watery diarrhea. These 3 episodes occurred over the past 6 months. He also reports complaints of exertional fatigue for which he is anticipating an office evaluation with his primary provider as well as cardiologist. He denies chest pain or weight loss.  REVIEW OF SYSTEMS:  All non-GI ROS negative except for fatigue and dizziness  Past Medical History  Diagnosis Date  . CAD (coronary artery disease)     Dr. Patty Sermons  . Hyperlipidemia   . Hypertension   . Hypothyroidism   . Ulcerative colitis     Dr Marina Goodell  . Hiatal hernia     Past Surgical History  Procedure Date  . Abdominal aortic aneurysm repair   . Coronary artery bypass graft     Social History Cincere Ravelo  reports that he quit smoking about 45 years ago. His smoking use included Cigarettes. He has never used smokeless tobacco. He reports  that he does not drink alcohol or use illicit drugs.  family history includes Cancer in his cousin; Colon cancer in his cousin; Hypertension in his father and other; and Lymphoma in his sister.  Allergies  Allergen Reactions  . Simvastatin     REACTION: myalgia  . Sulfonamide Derivatives     REACTION: rash       PHYSICAL EXAMINATION: Vital signs: BP 160/98  Pulse 60  Ht 5\' 11"  (1.803 m)  Wt 191 lb 3.2 oz (86.728 kg)  BMI 26.67 kg/m2  Constitutional: generally well-appearing, no acute distress Psychiatric: alert and oriented x3, cooperative Eyes: extraocular movements intact, anicteric, conjunctiva pink Mouth: oral pharynx moist, no lesions Neck: supple no lymphadenopathy Cardiovascular: heart regular rate and rhythm, no murmur Lungs: clear to auscultation bilaterally Abdomen: soft, nontender, nondistended, no obvious ascites, no peritoneal signs, normal bowel sounds, no organomegaly Rectal:anterior rectal fissure Extremities: no lower extremity edema bilaterally Skin: no lesions on visible extremities Neuro: No focal deficits.   ASSESSMENT:  #1. Chronic left-sided ulcerative colitis. Seemingly in remission on mesalamine. Last colonoscopy 2010. Due for followup #2. History of adenomatous colon polyps #3. GERD. Well-controlled on PPI. Negative screening endoscopy #4. Several isolated episodes of flushing followed by abdominal fullness and diarrhea. Etiology unclear. Question carcinoid syndrome #5. Rectal fissure #6. Complaints of exertional fatigue. Anticipate evaluation with his primary provider and cardiologist    PLAN:  #1. Continue mesalamine at current dosage. Refill. #2. Continue PPI. Refill. #3. 5-hydroxyindoleacetic acid, urinary to screen for carcinoid #4. Prescribe diltiazem and cream  2%. Apply to anal fissure/anal canal 5 times daily. Also, daily Metamucil supplementation #5. Due for surveillance colonoscopy. I have asked him to get clearance from his  primary provider and cardiologist prior. If clearance provided, then he can make an appointment with our previsit nurse to set up his examination.

## 2012-08-29 NOTE — Patient Instructions (Addendum)
Your physician has requested that you go to the basement for lab work before leaving today   We have sent the following medications to [facility]:  [facility].  They will call you when it is ready to be picked up  You may use 2 tablespoons of Metamucil in 12 ounces of water daily  Please call our office to schedule a colonoscopy after you have been evaluated and cleared by your primary care doctor and your cardiologist

## 2012-08-30 NOTE — Telephone Encounter (Signed)
Patient wanted to verify measurements for his Metamucil.  He said when he mixed it up it was too thick to drink.  I clarified that Dr. Marina Goodell had said 2 tablespoons a day in 12 or 14 ounces of water.  Patient acknowledged and agreed to try again.  I told him to call me if he continued to have problems.

## 2012-08-31 ENCOUNTER — Other Ambulatory Visit: Payer: Medicare Other

## 2012-08-31 DIAGNOSIS — K602 Anal fissure, unspecified: Secondary | ICD-10-CM

## 2012-08-31 DIAGNOSIS — K5100 Ulcerative (chronic) pancolitis without complications: Secondary | ICD-10-CM

## 2012-08-31 DIAGNOSIS — R197 Diarrhea, unspecified: Secondary | ICD-10-CM

## 2012-09-04 LAB — 5 HIAA, QUANTITATIVE, URINE, 24 HOUR: 5-HIAA, 24 Hr Urine: 3.6 mg/24 h (ref <=6.0)

## 2012-09-29 HISTORY — PX: NM MYOVIEW LTD: HXRAD82

## 2012-10-02 ENCOUNTER — Encounter: Payer: Self-pay | Admitting: Cardiology

## 2012-10-02 ENCOUNTER — Ambulatory Visit (INDEPENDENT_AMBULATORY_CARE_PROVIDER_SITE_OTHER): Payer: Medicare Other | Admitting: Cardiology

## 2012-10-02 VITALS — BP 138/82 | HR 64 | Ht 71.0 in | Wt 190.0 lb

## 2012-10-02 DIAGNOSIS — I119 Hypertensive heart disease without heart failure: Secondary | ICD-10-CM

## 2012-10-02 DIAGNOSIS — I10 Essential (primary) hypertension: Secondary | ICD-10-CM

## 2012-10-02 DIAGNOSIS — R42 Dizziness and giddiness: Secondary | ICD-10-CM

## 2012-10-02 DIAGNOSIS — I259 Chronic ischemic heart disease, unspecified: Secondary | ICD-10-CM

## 2012-10-02 DIAGNOSIS — R079 Chest pain, unspecified: Secondary | ICD-10-CM

## 2012-10-02 DIAGNOSIS — K5190 Ulcerative colitis, unspecified, without complications: Secondary | ICD-10-CM

## 2012-10-02 LAB — BASIC METABOLIC PANEL
BUN: 29 mg/dL — ABNORMAL HIGH (ref 6–23)
Calcium: 10.3 mg/dL (ref 8.4–10.5)
Chloride: 105 mEq/L (ref 96–112)
Creatinine, Ser: 1.4 mg/dL (ref 0.4–1.5)

## 2012-10-02 LAB — CBC WITH DIFFERENTIAL/PLATELET
Basophils Relative: 0.3 % (ref 0.0–3.0)
Eosinophils Absolute: 0.2 10*3/uL (ref 0.0–0.7)
HCT: 41.2 % (ref 39.0–52.0)
Hemoglobin: 13.9 g/dL (ref 13.0–17.0)
Lymphocytes Relative: 17 % (ref 12.0–46.0)
Lymphs Abs: 1.4 10*3/uL (ref 0.7–4.0)
MCHC: 33.7 g/dL (ref 30.0–36.0)
MCV: 96.8 fl (ref 78.0–100.0)
Monocytes Absolute: 0.8 10*3/uL (ref 0.1–1.0)
Neutro Abs: 5.9 10*3/uL (ref 1.4–7.7)
RBC: 4.26 Mil/uL (ref 4.22–5.81)
RDW: 13.2 % (ref 11.5–14.6)

## 2012-10-02 MED ORDER — RAMIPRIL 5 MG PO TABS: 5.0000 mg | ORAL_TABLET | Freq: Every day | ORAL | Status: DC

## 2012-10-02 NOTE — Progress Notes (Signed)
Chris Stokes Date of Birth:  Jan 01, 1929 [facility] [address] [place], [place]  [address] [phone]         Fax   [phone]  History of Present Illness: This pleasant 78 year old gentleman is seen for a scheduled followup visit. He does have a history of known ischemic heart disease. He had a previous myocardial infarction and in 2004 he underwent coronary artery bypass graft surgery. In February 2008 he had an nuclear stress test which showed an old inferolateral scar but no reversible ischemia. His ejection fraction was 40%. His echocardiogram in the past has shown impaired relaxation. He does not have any significant valvular heart disease by echo.  Recently he has had more shortness of breath.  He has had some dizzy spells.  He has had some tightness in his upper chest with exertion.  Current Outpatient Prescriptions  Medication Sig Dispense Refill  . aspirin 81 MG tablet Take 81 mg by mouth daily.        . carvedilol (COREG) 25 MG tablet Take 0.5 tablets (12.5 mg total) by mouth 2 (two) times daily with a meal.  90 tablet  3  . Cholecalciferol (VITAMIN D3) 2000 UNITS TABS Take 1 tablet by mouth 2 (two) times daily.        [name] diltiazem 2 % GEL Apply to affected area 5 times a day  30 g  2  . Fexofenadine HCl (KLS ALLER-FEX PO) Take by mouth. 120 mg take one tablet by mouth daily       . folic acid (FOLVITE) 800 MCG tablet Take 800 mcg by mouth daily.        . Garlic 1000 MG CAPS Take 1 capsule by mouth 2 (two) times daily.        [name] levothyroxine (LEVOTHROID) 137 MCG tablet Take 1 tablet (137 mcg total) by mouth daily.  90 tablet  3  . mesalamine (ASACOL) 400 MG EC tablet Take 4 tablets (1,600 mg total) by mouth 3 (three) times daily.  1080 tablet  3  . Omega-3 Fatty Acids (FISH OIL) 1000 MG CAPS Take 1,000 mg by mouth daily.       [name] omeprazole (PRILOSEC) 20 MG capsule Take 20 mg by mouth daily as needed.        . ramipril (ALTACE) 5 MG tablet Take 1 tablet (5  mg total) by mouth daily.  90 tablet  3  . rosuvastatin (CRESTOR) 10 MG tablet Take 1 tablet (10 mg total) by mouth daily.  90 tablet  3  . spironolactone (ALDACTONE) 25 MG tablet Take 25 mg by mouth as directed. 1/2 tablet daily      . [DISCONTINUED] ramipril (ALTACE) 10 MG tablet Take 1 tablet (10 mg total) by mouth daily.  90 tablet  3  . [DISCONTINUED] spironolactone (ALDACTONE) 25 MG tablet Take 1 tablet (25 mg total) by mouth daily.  90 tablet  3    Allergies  Allergen Reactions  . Simvastatin     REACTION: myalgia  . Sulfonamide Derivatives     REACTION: rash    Patient Active Problem List  Diagnosis  . COLONIC POLYPS  . OTHER SPECIFIED ACQUIRED HYPOTHYROIDISM  . HYPOTHYROIDISM  . HYPERLIPIDEMIA  . HYPERKALEMIA  . HYPERTENSION  . Cor Athrscl-Uns Vessel  . ABDOMINAL AORTIC ANEURYSM  . GERD  . ULCERATIVE COLITIS  . HEMATOCHEZIA  . BLADDER NECK OBSTRUCTION  . HAND PAIN  . COUGH  . HYPERGLYCEMIA  . OTHER DRUG ALLERGY  .  NEPHROLITHIASIS, HX OF  . TOBACCO USE, QUIT  . Ischemic heart disease    History  Smoking status  . Former Smoker  . Types: Cigarettes  . Quit date: 11/29/1966  Smokeless tobacco  . Never Used    History  Alcohol Use No    Family History  Problem Relation Age of Onset  . Colon cancer Cousin   . Cancer Cousin     colon  . Lymphoma Sister   . Hypertension Other   . Hypertension Father     Review of Systems: Constitutional: no fever chills diaphoresis or fatigue or change in weight.  Head and neck: no hearing loss, no epistaxis, no photophobia or visual disturbance. Respiratory: No cough, shortness of breath or wheezing. Cardiovascular: No chest pain peripheral edema, palpitations. Gastrointestinal: No abdominal distention, no abdominal pain, no change in bowel habits hematochezia or melena. Genitourinary: No dysuria, no frequency, no urgency, no nocturia. Musculoskeletal:No arthralgias, no back pain, no gait disturbance or  myalgias. Neurological: No dizziness, no headaches, no numbness, no seizures, no syncope, no weakness, no tremors. Hematologic: No lymphadenopathy, no easy bruising. Psychiatric: No confusion, no hallucinations, no sleep disturbance.    Physical Exam: Filed Vitals:   10/02/12 1345  BP: 138/82  Pulse: 64   the general appearance reveals a well-developed well-nourished elderly gentleman in no distress.The head and neck exam reveals pupils equal and reactive.  Extraocular movements are full.  There is no scleral icterus.  The mouth and pharynx are normal.  The neck is supple.  The carotids reveal no bruits.  The jugular venous pressure is normal.  The  thyroid is not enlarged.  There is no lymphadenopathy.  The chest is clear to percussion and auscultation.  There are no rales or rhonchi.  Expansion of the chest is symmetrical.  The precordium is quiet.  The first heart sound is normal.  The second heart sound is physiologically split.  There is no murmur gallop rub or click.  There is no abnormal lift or heave.  The abdomen is soft and nontender.  The bowel sounds are normal.  The liver and spleen are not enlarged.  There are no abdominal masses.  There are no abdominal bruits.  Extremities reveal good pedal pulses.  There is no phlebitis or edema.  There is no cyanosis or clubbing.  Strength is normal and symmetrical in all extremities.  There is no lateralizing weakness.  There are no sensory deficits.  The skin is warm and dry.  There is no rash.  EKG shows sinus bradycardia with first degree AV block and a rate of 58.  There is a pattern of an old inferior wall MI unchanged since 03/23/12   Assessment / Plan: Because of his dizziness and intermittent low blood pressure readings we will reduce his ramipril and his spironolactone dose. To evaluate his exertional chest tightness and a cold feeling in the chest symptoms we will do walking Lexus scan Myoview  Recheck in 6 months for followup office  visit.  Blood work today pending

## 2012-10-02 NOTE — Assessment & Plan Note (Signed)
The patient has chronic ulcerative colitis.  He is due for another colonoscopy by Dr. Marina Goodell.  We will want to wait until his cardiovascular status is more stable.

## 2012-10-02 NOTE — Progress Notes (Signed)
Quick Note:  Please report to patient. The recent labs are stable. Continue same medication and careful diet. There is no anemia. The kidney function has improved since 06/02/12. ______

## 2012-10-02 NOTE — Assessment & Plan Note (Signed)
The patient has a history of hypertension.  He has had some episodes of recent sporadic very low blood pressure.  This occurs he sees spots before his eyes and feels weak.  Most recent labs in the chart show that his renal function has decreased and his last GFR was 42.8 on 06/02/12.  We will empirically reduce his REM appear ill down to 5 mg daily and I am reducing his spironolactone to one half tablet daily we're getting lab work today including a basal metabolic panel and a CBC to evaluate his dizziness

## 2012-10-02 NOTE — Patient Instructions (Addendum)
Decrease your Ramipril to 5 mg daily, Rx sent to [facility]  Decrease your Spironolactone to 25 mg 1/2 tablet daily  Will obtain labs today and call you with the results (bmet/cbc)  Your physician has requested that you have a lexiscan myoview. For further information please visit [email or web address] Please follow instruction sheet, as given.  Your physician wants you to follow-up in: 6 months You will receive a reminder letter in the mail two months in advance. If you don't receive a letter, please call our office to schedule the follow-up appointment.

## 2012-10-02 NOTE — Assessment & Plan Note (Signed)
The patient has known ischemic heart disease.  Recently he has had a feeling that something may have changed and he is noted a feeling in his chest as if he has been exercising in cold air.  There is no left arm radiation.  His electrocardiogram today shows a pattern of an old inferior wall MI but is unchanged since 03/23/12 and shows no acute changes.  We will have the patient return for a walking LexiScan Myoview.

## 2012-10-03 ENCOUNTER — Telehealth: Payer: Self-pay | Admitting: *Deleted

## 2012-10-03 ENCOUNTER — Encounter: Payer: Self-pay | Admitting: Internal Medicine

## 2012-10-03 ENCOUNTER — Ambulatory Visit (INDEPENDENT_AMBULATORY_CARE_PROVIDER_SITE_OTHER): Payer: Medicare Other | Admitting: Internal Medicine

## 2012-10-03 VITALS — BP 140/98 | HR 76 | Temp 96.7°F | Resp 16 | Wt 191.0 lb

## 2012-10-03 DIAGNOSIS — K5190 Ulcerative colitis, unspecified, without complications: Secondary | ICD-10-CM

## 2012-10-03 DIAGNOSIS — I10 Essential (primary) hypertension: Secondary | ICD-10-CM

## 2012-10-03 DIAGNOSIS — E785 Hyperlipidemia, unspecified: Secondary | ICD-10-CM

## 2012-10-03 DIAGNOSIS — I2510 Atherosclerotic heart disease of native coronary artery without angina pectoris: Secondary | ICD-10-CM

## 2012-10-03 DIAGNOSIS — E039 Hypothyroidism, unspecified: Secondary | ICD-10-CM

## 2012-10-03 MED ORDER — MESALAMINE 400 MG PO TBEC: 1600.0000 mg | DELAYED_RELEASE_TABLET | Freq: Three times a day (TID) | ORAL | Status: DC

## 2012-10-03 NOTE — Assessment & Plan Note (Signed)
Colonoscopy is planned. 

## 2012-10-03 NOTE — Telephone Encounter (Signed)
Message copied by Burnell Blanks on Tue Oct 03, 2012  2:00 PM ------      Message from: Cassell Clement      Created: Mon Oct 02, 2012  8:41 PM       Please report to patient.  The recent labs are stable. Continue same medication and careful diet. There is no anemia.  The kidney function has improved since 06/02/12.

## 2012-10-03 NOTE — Assessment & Plan Note (Signed)
CL is pending Continue with current prescription therapy as reflected on the Med list.

## 2012-10-03 NOTE — Assessment & Plan Note (Signed)
Continue with current prescription therapy as reflected on the Med list.  

## 2012-10-03 NOTE — Telephone Encounter (Signed)
Mailed copy of labs and left message to call if any questions  

## 2012-10-03 NOTE — Progress Notes (Signed)
   Subjective:    Patient ID: Chris Stokes, male    DOB: 10/25/1929, 78 y.o.   MRN: [id number]  HPI  The patient presents for a follow-up of  chronic hypertension, colitis, chronic dyslipidemia, CAD controlled with medicines. He is to have a stress test done next wk.  BP Readings from Last 3 Encounters:  10/03/12 140/98  10/02/12 138/82  08/29/12 160/98   Wt Readings from Last 3 Encounters:  10/03/12 191 lb (86.637 kg)  10/02/12 190 lb (86.183 kg)  08/29/12 191 lb 3.2 oz (86.728 kg)      Review of Systems  Constitutional: Positive for fatigue. Negative for appetite change and unexpected weight change.  HENT: Negative for nosebleeds, congestion, sore throat, sneezing, trouble swallowing and neck pain.   Eyes: Negative for itching and visual disturbance.  Respiratory: Negative for cough.   Cardiovascular: Negative for chest pain, palpitations and leg swelling.  Gastrointestinal: Negative for nausea, diarrhea, blood in stool and abdominal distention.  Genitourinary: Negative for frequency and hematuria.  Musculoskeletal: Negative for back pain, joint swelling and gait problem.  Skin: Negative for rash.  Neurological: Negative for dizziness, tremors, speech difficulty and weakness.  Psychiatric/Behavioral: Negative for sleep disturbance, dysphoric mood and agitation. The patient is not nervous/anxious.        Objective:   Physical Exam  Constitutional: He is oriented to person, place, and time. He appears well-developed.  HENT:  Mouth/Throat: Oropharynx is clear and moist.  Eyes: Conjunctivae normal are normal. Pupils are equal, round, and reactive to light.  Neck: Normal range of motion. No JVD present. No thyromegaly present.  Cardiovascular: Normal rate, regular rhythm, normal heart sounds and intact distal pulses.  Exam reveals no gallop and no friction rub.   No murmur heard. Pulmonary/Chest: Effort normal and breath sounds normal. No respiratory distress. He has no  wheezes. He has no rales. He exhibits no tenderness.  Abdominal: Soft. Bowel sounds are normal. He exhibits no distension and no mass. There is no tenderness. There is no rebound and no guarding.  Musculoskeletal: Normal range of motion. He exhibits no edema and no tenderness.  Lymphadenopathy:    He has no cervical adenopathy.  Neurological: He is alert and oriented to person, place, and time. He has normal reflexes. No cranial nerve deficit. He exhibits normal muscle tone. Coordination normal.  Skin: Skin is warm and dry. No rash noted.  Psychiatric: He has a normal mood and affect. His behavior is normal. Judgment and thought content normal.     Lab Results  Component Value Date   WBC 8.3 10/02/2012   HGB 13.9 10/02/2012   HCT 41.2 10/02/2012   PLT 133.0* 10/02/2012   GLUCOSE 94 10/02/2012   CHOL 137 06/02/2012   TRIG 112.0 06/02/2012   HDL 36.30* 06/02/2012   LDLCALC 78 06/02/2012   ALT 14 06/02/2012   AST 19 06/02/2012   NA 136 10/02/2012   K 4.8 10/02/2012   CL 105 10/02/2012   CREATININE 1.4 10/02/2012   BUN 29* 10/02/2012   CO2 24 10/02/2012   TSH 18.60* 06/02/2012   PSA 1.91 11/09/2010        Assessment & Plan:

## 2012-10-10 ENCOUNTER — Ambulatory Visit (HOSPITAL_COMMUNITY): Payer: Medicare Other | Attending: Cardiology | Admitting: Radiology

## 2012-10-10 VITALS — BP 148/81 | Ht 71.0 in | Wt 184.0 lb

## 2012-10-10 DIAGNOSIS — R0602 Shortness of breath: Secondary | ICD-10-CM | POA: Insufficient documentation

## 2012-10-10 DIAGNOSIS — R5383 Other fatigue: Secondary | ICD-10-CM | POA: Insufficient documentation

## 2012-10-10 DIAGNOSIS — Z951 Presence of aortocoronary bypass graft: Secondary | ICD-10-CM | POA: Insufficient documentation

## 2012-10-10 DIAGNOSIS — E785 Hyperlipidemia, unspecified: Secondary | ICD-10-CM | POA: Insufficient documentation

## 2012-10-10 DIAGNOSIS — I10 Essential (primary) hypertension: Secondary | ICD-10-CM | POA: Insufficient documentation

## 2012-10-10 DIAGNOSIS — I739 Peripheral vascular disease, unspecified: Secondary | ICD-10-CM | POA: Insufficient documentation

## 2012-10-10 DIAGNOSIS — R002 Palpitations: Secondary | ICD-10-CM | POA: Insufficient documentation

## 2012-10-10 DIAGNOSIS — I252 Old myocardial infarction: Secondary | ICD-10-CM | POA: Insufficient documentation

## 2012-10-10 DIAGNOSIS — I2510 Atherosclerotic heart disease of native coronary artery without angina pectoris: Secondary | ICD-10-CM

## 2012-10-10 DIAGNOSIS — I4949 Other premature depolarization: Secondary | ICD-10-CM

## 2012-10-10 DIAGNOSIS — Z87891 Personal history of nicotine dependence: Secondary | ICD-10-CM | POA: Insufficient documentation

## 2012-10-10 DIAGNOSIS — R5381 Other malaise: Secondary | ICD-10-CM | POA: Insufficient documentation

## 2012-10-10 DIAGNOSIS — R0789 Other chest pain: Secondary | ICD-10-CM | POA: Insufficient documentation

## 2012-10-10 DIAGNOSIS — R079 Chest pain, unspecified: Secondary | ICD-10-CM

## 2012-10-10 DIAGNOSIS — R42 Dizziness and giddiness: Secondary | ICD-10-CM | POA: Insufficient documentation

## 2012-10-10 MED ORDER — TECHNETIUM TC 99M SESTAMIBI GENERIC - CARDIOLITE
11.0000 | Freq: Once | INTRAVENOUS | Status: AC | PRN
  Administered 2012-10-10: 11 via INTRAVENOUS

## 2012-10-10 MED ORDER — REGADENOSON 0.4 MG/5ML IV SOLN
0.4000 mg | Freq: Once | INTRAVENOUS | Status: AC
  Administered 2012-10-10: 0.4 mg via INTRAVENOUS

## 2012-10-10 MED ORDER — TECHNETIUM TC 99M SESTAMIBI GENERIC - CARDIOLITE
33.0000 | Freq: Once | INTRAVENOUS | Status: AC | PRN
  Administered 2012-10-10: 33 via INTRAVENOUS

## 2012-10-10 NOTE — Progress Notes (Signed)
[facility] SITE 3 NUCLEAR MED [id number] [place] [address] [phone]  Cardiology Nuclear Med Study  Chris Stokes is a 78 y.o. male     MRN : [id number]     DOB: 1929-09-11  Procedure Date: 10/10/2012  Nuclear Med Background Indication for Stress Test:  Evaluation for Ischemia History:  '04 MI-Heart Cath-CABG, '08 MPS: (-) ischemia EF: 40% Cardiac Risk Factors: History of Smoking, Hypertension, Lipids and PVD  Symptoms:  Chest Tightness, Dizziness, Fatigue, Palpitations and SOB   Nuclear Pre-Procedure Caffeine/Decaff Intake:  None NPO After: 7:00pm   Lungs:  clear O2 Sat: 95% on room air. IV 0.9% NS with Angio Cath:  22g  IV Site: R Hand  IV Started by:  Cathlyn Parsons, RN  Chest Size (in):  44 Cup Size: n/a  Height: 5\' 11"  (1.803 m)  Weight:  184 lb (83.462 kg)  BMI:  Body mass index is 25.66 kg/(m^2). Tech Comments:  Coreg taken at 0900    Nuclear Med Study 1 or 2 day study: 1 day  Stress Test Type:  Treadmill/Lexiscan  Reading MD: Willa Rough, MD  Order Authorizing Provider:  Villa Herb  Resting Radionuclide: Technetium 78m Sestamibi  Resting Radionuclide Dose: 11.0 mCi   Stress Radionuclide:  Technetium 32m Sestamibi  Stress Radionuclide Dose: 33.0 mCi           Stress Protocol Rest HR: 55 Stress HR: 79  Rest BP: 148/81 Stress BP: 139/85  Exercise Time (min): n/a METS: n/a   Predicted Max HR: 139 bpm % Max HR: 56.83 bpm Rate Pressure Product: 62952   Dose of Adenosine (mg):  n/a Dose of Lexiscan: 0.4 mg  Dose of Atropine (mg): n/a Dose of Dobutamine: n/a mcg/kg/min (at max HR)  Stress Test Technologist: Milana Na, EMT-P  Nuclear Technologist:  Domenic Polite, CNMT     Rest Procedure:  Myocardial perfusion imaging was performed at rest 45 minutes following the intravenous administration of Technetium 51m Sestamibi. Rest ECG: Sinus Bradycardia, 1st degree AVB and PVCS  Stress Procedure:  The  patient received IV Lexiscan 0.4 mg over 15-seconds with concurrent low level exercise and then Technetium 21m Sestamibi was injected at 30-seconds while the patient continued walking one more minute. There were no significant changes, + chest tightness, feels weird, and occ pvcs with Lexiscan. Quantitative spect images were obtained after a 45-minute delay. Stress ECG: No significant change from baseline ECG  QPS Raw Data Images:  Normal; no motion artifact; normal heart/lung ratio. Stress Images:  There is a moderate area of decreased uptake affecting the base mid and apical segments of the inferior wall and inferolateral wall. The degree of photon reduction is moderate Rest Images:  The rest images appear Similar to the stress images. Subtraction (SDS):  There may be slight reversibility Transient Ischemic Dilatation (Normal <1.22):  0.97 Lung/Heart Ratio (Normal <0.45):  0.36  Quantitative Gated Spect Images QGS EDV:  122 ml QGS ESV:  58 ml  Impression Exercise Capacity:  Lexiscan with low level exercise. BP Response:  Normal blood pressure response. Clinical Symptoms:  chest tightness ECG Impression:  No significant ST segment change suggestive of ischemia. Comparison with Prior Nuclear Study: No images to compare  Overall Impression:  There is evidence of a small/moderate scar affecting the inferior wall in the inferolateral wall. There may be slight peri-infarct ischemia.  LV Ejection Fraction: 53%.  LV Wall Motion:     There is mild hypokinesis of the septum and  the apex.  Tinnie Gens Katz,MD

## 2012-10-13 ENCOUNTER — Telehealth: Payer: Self-pay | Admitting: Cardiology

## 2012-10-13 DIAGNOSIS — R0602 Shortness of breath: Secondary | ICD-10-CM

## 2012-10-13 NOTE — Telephone Encounter (Signed)
Message copied by Burnell Blanks on Fri Oct 13, 2012  2:30 PM ------      Message from: Cassell Clement      Created: Wed Oct 11, 2012  8:20 PM       Please report.  Myoview shows old MI but no significant ischemia.  The LV systolic function has improved since last study.  EF is up to 53% now. CSD

## 2012-10-13 NOTE — Telephone Encounter (Signed)
Advised patient and will have him go for a chest xray per  Dr. Patty Sermons since still having shortness of breath

## 2012-10-13 NOTE — Telephone Encounter (Signed)
F/u  Returning call back to nurse.  

## 2012-10-13 NOTE — Addendum Note (Signed)
Addended by: Regis Bill B on: 10/13/2012 03:48 PM   Modules accepted: Orders

## 2012-10-17 ENCOUNTER — Encounter: Payer: Self-pay | Admitting: Internal Medicine

## 2012-10-17 ENCOUNTER — Telehealth: Payer: Self-pay | Admitting: *Deleted

## 2012-10-17 ENCOUNTER — Ambulatory Visit (INDEPENDENT_AMBULATORY_CARE_PROVIDER_SITE_OTHER)
Admission: RE | Admit: 2012-10-17 | Discharge: 2012-10-17 | Disposition: A | Discharge status: Home / Self Care | Payer: Medicare Other | Source: Ambulatory Visit | Attending: Cardiology | Admitting: Cardiology

## 2012-10-17 ENCOUNTER — Ambulatory Visit (INDEPENDENT_AMBULATORY_CARE_PROVIDER_SITE_OTHER): Payer: Medicare Other | Admitting: Internal Medicine

## 2012-10-17 ENCOUNTER — Other Ambulatory Visit: Payer: Self-pay | Admitting: Internal Medicine

## 2012-10-17 ENCOUNTER — Other Ambulatory Visit (INDEPENDENT_AMBULATORY_CARE_PROVIDER_SITE_OTHER): Payer: Medicare Other

## 2012-10-17 VITALS — BP 112/68 | HR 61 | Temp 97.8°F | Ht 71.0 in | Wt 192.0 lb

## 2012-10-17 DIAGNOSIS — M109 Gout, unspecified: Secondary | ICD-10-CM

## 2012-10-17 DIAGNOSIS — B369 Superficial mycosis, unspecified: Secondary | ICD-10-CM

## 2012-10-17 DIAGNOSIS — B354 Tinea corporis: Secondary | ICD-10-CM

## 2012-10-17 DIAGNOSIS — J8410 Pulmonary fibrosis, unspecified: Secondary | ICD-10-CM

## 2012-10-17 DIAGNOSIS — R0602 Shortness of breath: Secondary | ICD-10-CM

## 2012-10-17 MED ORDER — ALLOPURINOL 100 MG PO TABS: 100.0000 mg | ORAL_TABLET | Freq: Every day | ORAL | Status: DC

## 2012-10-17 MED ORDER — TRIAMCINOLONE ACETONIDE 0.1 % EX CREA: TOPICAL_CREAM | Freq: Two times a day (BID) | CUTANEOUS | Status: DC

## 2012-10-17 MED ORDER — INDOMETHACIN 50 MG PO CAPS: 50.0000 mg | ORAL_CAPSULE | Freq: Three times a day (TID) | ORAL | Status: DC

## 2012-10-17 NOTE — Progress Notes (Signed)
Uric acid level high. Will order allopurinol for prevention

## 2012-10-17 NOTE — Progress Notes (Signed)
Subjective:    Patient ID: Chris Stokes, male    DOB: 12/31/1928, 78 y.o.   MRN: [id number]  HPI  Pt presents to the clinic today with c/o a swollen big toe on his right foot. This started approximately 3 days ago. He noticed some sharp pains traveling down his toe. It later became swollen, red and hot. He has not taken anything except Ibuprofen for the pain which has mildly relieved the pain. The pain is 6/10 and constant. Touching the area makes it worse. He does not have a history of gout. He does eat red meat multiple times a week. He does no drink alcohol. Additionally, patient reports a small ras on his left forearm. It is red and itchy. It has been there for more than a month. He has not tried to take anything for the itch or put anything on the area. The itch is intermittent.  Review of Systems  Past Medical History  Diagnosis Date  . CAD (coronary artery disease)     Dr. Patty Sermons  . Hyperlipidemia   . Hypertension   . Hypothyroidism   . Ulcerative colitis     Dr Marina Goodell  . Hiatal hernia     Current Outpatient Prescriptions  Medication Sig Dispense Refill  . aspirin 81 MG tablet Take 81 mg by mouth daily.        . carvedilol (COREG) 25 MG tablet Take 0.5 tablets (12.5 mg total) by mouth 2 (two) times daily with a meal.  90 tablet  3  . Cholecalciferol (VITAMIN D3) 2000 UNITS TABS Take 1 tablet by mouth 2 (two) times daily.        [name] diltiazem 2 % GEL Apply to affected area 5 times a day  30 g  2  . Fexofenadine HCl (KLS ALLER-FEX PO) Take by mouth. 120 mg take one tablet by mouth daily       . folic acid (FOLVITE) 800 MCG tablet Take 800 mcg by mouth daily.        . Garlic 1000 MG CAPS Take 1 capsule by mouth 2 (two) times daily.        [name] levothyroxine (LEVOTHROID) 137 MCG tablet Take 1 tablet (137 mcg total) by mouth daily.  90 tablet  3  . mesalamine (ASACOL) 400 MG EC tablet Take 4 tablets (1,600 mg total) by mouth 3 (three) times daily.  1080 tablet  3  . Omega-3 Fatty Acids  (FISH OIL) 1000 MG CAPS Take 1,000 mg by mouth daily.       [name] omeprazole (PRILOSEC) 20 MG capsule Take 20 mg by mouth daily as needed.        . ramipril (ALTACE) 5 MG tablet Take 1 tablet (5 mg total) by mouth daily.  90 tablet  3  . rosuvastatin (CRESTOR) 10 MG tablet Take 1 tablet (10 mg total) by mouth daily.  90 tablet  3  . spironolactone (ALDACTONE) 25 MG tablet Take 25 mg by mouth as directed. 1/2 tablet daily        Allergies  Allergen Reactions  . Simvastatin     REACTION: myalgia  . Sulfonamide Derivatives     REACTION: rash    Family History  Problem Relation Age of Onset  . Colon cancer Cousin   . Cancer Cousin     colon  . Lymphoma Sister   . Hypertension Other   . Hypertension Father     History   Social History  . Marital Status: Married  Spouse Name: N/A    Number of Children: 2  . Years of Education: N/A   Occupational History  . retired   .     Social History Main Topics  . Smoking status: Former Smoker    Types: Cigarettes    Quit date: 11/29/1966  . Smokeless tobacco: Never Used  . Alcohol Use: No  . Drug Use: No  . Sexually Active: Not on file   Other Topics Concern  . Not on file   Social History Narrative  . No narrative on file     Constitutional: Denies fever, malaise, fatigue, headache or abrupt weight changes.  Musculoskeletal: Pt reports swelling and pain of his right big toe. Denies decrease in range of motion, difficulty with gait, muscle pain or joint pain and swelling.  Skin: Pt reports rash on the left forearm. Denies redness, lesions or ulcercations.  No other specific complaints in a complete review of systems (except as listed in HPI above).     Objective:   Physical Exam  BP 112/68  Pulse 61  Temp 97.8 F (36.6 C) (Oral)  Ht 5\' 11"  (1.803 m)  Wt 192 lb (87.091 kg)  BMI 26.78 kg/m2  SpO2 93% Wt Readings from Last 3 Encounters:  10/17/12 192 lb (87.091 kg)  10/10/12 184 lb (83.462 kg)  10/03/12 191 lb  (86.637 kg)    General: Appears his stated age, well developed, well nourished in NAD. Skin: Right big toe hot and erythematous at the PIP joint.  Small nickel size circular rash noted on the left forearm with central clearing. Cardiovascular: Normal rate and rhythm. S1,S2 noted.  No murmur, rubs or gallops noted. No JVD or BLE edema. No carotid bruits noted. Pulmonary/Chest: Normal effort and positive vesicular breath sounds. No respiratory distress. No wheezes, rales or ronchi noted.  Musculoskeletal: Swelling of the PIP joint of the right big toe. Normal range of motion. No difficulty with gait.         Assessment & Plan:   Acute Gout, new onset with additional workup required:  Indomethacin TID x 5 days. Obtain lab for uric acid level. Avoid red meats  Fungal Infection of the Skin, new onset with additional workup required:  Triamcinolone cream, apply to the affected area daily.  RTC as need or if symptoms persist.

## 2012-10-17 NOTE — Telephone Encounter (Signed)
Advised and scheduled appointment with Dr Shelle Iron 11/07/12 at 10:00

## 2012-10-17 NOTE — Patient Instructions (Addendum)
Gout Gout is an inflammatory condition (arthritis) caused by a buildup of uric acid crystals in the joints. Uric acid is a chemical that is normally present in the blood. Under some circumstances, uric acid can form into crystals in your joints. This causes joint redness, soreness, and swelling (inflammation). Repeat attacks are common. Over time, uric acid crystals can form into masses (tophi) near a joint, causing disfigurement. Gout is treatable and often preventable. CAUSES  The disease begins with elevated levels of uric acid in the blood. Uric acid is produced by your body when it breaks down a naturally found substance called purines. This also happens when you eat certain foods such as meats and fish. Causes of an elevated uric acid level include:  Being passed down from parent to child (heredity).  Diseases that cause increased uric acid production (obesity, psoriasis, some cancers).  Excessive alcohol use.  Diet, especially diets rich in meat and seafood.  Medicines, including certain cancer-fighting drugs (chemotherapy), diuretics, and aspirin.  Chronic kidney disease. The kidneys are no longer able to remove uric acid well.  Problems with metabolism. Conditions strongly associated with gout include:  Obesity.  High blood pressure.  High cholesterol.  Diabetes. Not everyone with elevated uric acid levels gets gout. It is not understood why some people get gout and others do not. Surgery, joint injury, and eating too much of certain foods are some of the factors that can lead to gout. SYMPTOMS   An attack of gout comes on quickly. It causes intense pain with redness, swelling, and warmth in a joint.  Fever can occur.  Often, only one joint is involved. Certain joints are more commonly involved:  Base of the big toe.  Knee.  Ankle.  Wrist.  Finger. Without treatment, an attack usually goes away in a few days to weeks. Between attacks, you usually will not have  symptoms, which is different from many other forms of arthritis. DIAGNOSIS  Your caregiver will suspect gout based on your symptoms and exam. Removal of fluid from the joint (arthrocentesis) is done to check for uric acid crystals. Your caregiver will give you a medicine that numbs the area (local anesthetic) and use a needle to remove joint fluid for exam. Gout is confirmed when uric acid crystals are seen in joint fluid, using a special microscope. Sometimes, blood, urine, and X-ray tests are also used. TREATMENT  There are 2 phases to gout treatment: treating the sudden onset (acute) attack and preventing attacks (prophylaxis). Treatment of an Acute Attack  Medicines are used. These include anti-inflammatory medicines or steroid medicines.  An injection of steroid medicine into the affected joint is sometimes necessary.  The painful joint is rested. Movement can worsen the arthritis.  You may use warm or cold treatments on painful joints, depending which works best for you.  Discuss the use of coffee, vitamin C, or cherries with your caregiver. These may be helpful treatment options. Treatment to Prevent Attacks After the acute attack subsides, your caregiver may advise prophylactic medicine. These medicines either help your kidneys eliminate uric acid from your body or decrease your uric acid production. You may need to stay on these medicines for a very long time. The early phase of treatment with prophylactic medicine can be associated with an increase in acute gout attacks. For this reason, during the first few months of treatment, your caregiver may also advise you to take medicines usually used for acute gout treatment. Be sure you understand your caregiver's directions.   You should also discuss dietary treatment with your caregiver. Certain foods such as meats and fish can increase uric acid levels. Other foods such as dairy can decrease levels. Your caregiver can give you a list of foods  to avoid. HOME CARE INSTRUCTIONS   Do not take aspirin to relieve pain. This raises uric acid levels.  Only take over-the-counter or prescription medicines for pain, discomfort, or fever as directed by your caregiver.  Rest the joint as much as possible. When in bed, keep sheets and blankets off painful areas.  Keep the affected joint raised (elevated).  Use crutches if the painful joint is in your leg.  Drink enough water and fluids to keep your urine clear or pale yellow. This helps your body get rid of uric acid. Do not drink alcoholic beverages. They slow the passage of uric acid.  Follow your caregiver's dietary instructions. Pay careful attention to the amount of protein you eat. Your daily diet should emphasize fruits, vegetables, whole grains, and fat-free or low-fat milk products.  Maintain a healthy body weight. SEEK MEDICAL CARE IF:   You have an oral temperature above 102 F (38.9 C).  You develop diarrhea, vomiting, or any side effects from medicines.  You do not feel better in 24 hours, or you are getting worse. SEEK IMMEDIATE MEDICAL CARE IF:   Your joint becomes suddenly more tender and you have:  Chills.  An oral temperature above 102 F (38.9 C), not controlled by medicine. MAKE SURE YOU:   Understand these instructions.  Will watch your condition.  Will get help right away if you are not doing well or get worse. Document Released: 11/12/2000 Document Revised: 02/07/2012 Document Reviewed: 02/23/2010 [employer] Patient Information 2013 [employer].    

## 2012-10-17 NOTE — Telephone Encounter (Signed)
Message copied by Burnell Blanks on Tue Oct 17, 2012  3:12 PM ------      Message from: Cassell Clement      Created: Tue Oct 17, 2012  2:51 PM       Please report. The chest xray shows changes of advanced fibrosis.  His previous xray 2004 did not mention fibrosis. The fibrosis is probably what is causing his dyspnea with exertion.  I would like him to see one of the pulmonologists such as Dr. Shelle Iron unless he is already seeing one.

## 2012-10-18 ENCOUNTER — Other Ambulatory Visit: Payer: Self-pay | Admitting: *Deleted

## 2012-10-18 MED ORDER — MESALAMINE 400 MG PO TBEC: 1600.0000 mg | DELAYED_RELEASE_TABLET | Freq: Three times a day (TID) | ORAL | Status: DC

## 2012-10-24 ENCOUNTER — Telehealth: Payer: Self-pay | Admitting: *Deleted

## 2012-10-24 MED ORDER — MESALAMINE 800 MG PO TBEC: 2.0000 | DELAYED_RELEASE_TABLET | Freq: Three times a day (TID) | ORAL | Status: DC

## 2012-10-24 NOTE — Telephone Encounter (Signed)
New Rx sent to [facility]. Pt informed- he states he wants this Rx cancelled. I called [facility] and the pt is not in their system. I advised representative that I e prescribed med to them. She states she will cancel the request since this is not the patient's correct pharmacy.

## 2012-10-24 NOTE — Telephone Encounter (Signed)
Rec rf req for Asacol HD 800 mg as an alternative to the 400 mg. Please advise

## 2012-10-24 NOTE — Telephone Encounter (Signed)
Ok w/me Thxx

## 2012-11-07 ENCOUNTER — Encounter: Payer: Self-pay | Admitting: Pulmonary Disease

## 2012-11-07 ENCOUNTER — Ambulatory Visit (INDEPENDENT_AMBULATORY_CARE_PROVIDER_SITE_OTHER): Payer: Medicare Other | Admitting: Pulmonary Disease

## 2012-11-07 VITALS — BP 128/62 | HR 58 | Temp 97.3°F | Ht 71.0 in | Wt 187.6 lb

## 2012-11-07 DIAGNOSIS — J8410 Pulmonary fibrosis, unspecified: Secondary | ICD-10-CM

## 2012-11-07 DIAGNOSIS — R0609 Other forms of dyspnea: Secondary | ICD-10-CM

## 2012-11-07 DIAGNOSIS — R0600 Dyspnea, unspecified: Secondary | ICD-10-CM

## 2012-11-07 DIAGNOSIS — J849 Interstitial pulmonary disease, unspecified: Secondary | ICD-10-CM

## 2012-11-07 NOTE — Assessment & Plan Note (Signed)
The patient has been found to have significant interstitial lung disease on his chest x-ray, and also has progressive dyspnea on exertion.  It is unclear whether this is related to idiopathic pulmonary fibrosis, possibly asbestos exposure, or whether it may be related to an autoimmune process.  At this point, he needs a high resolution CT of his chest for care after his sedation, as well as full pulmonary function studies.  Once these are done, I will see him back for further evaluation.

## 2012-11-07 NOTE — Patient Instructions (Addendum)
Will set you up for breathing tests, as well as a scan of your chest. Will arrange followup with me once the results are available.

## 2012-11-07 NOTE — Progress Notes (Signed)
Subjective:    Patient ID: Chris Stokes, male    DOB: [birth year], 78 y.o.   MRN: [id number]  HPI The patient is an 78 year old male who I have been asked to see for an abnormal chest x-ray and dyspnea.  The patient has had a recent chest x-ray that showed bilateral interstitial lung disease, right greater than left.  Earlier chest x-rays are not available for review, but a report from 2000 does not mention any type of interstitial process.  A report from 2004 dimensions accentuation of lung markings in the lower lobes.  The patient states that his breathing has slowly worsened over the last 3-4 years, and the family believes it has accelerated over the last 6 months.  The patient states he will get short of breath walking 75 feet at a moderate pace on flat ground.  He will also get winded walking up one flight of stairs, but denies an issue with bringing groceries in from the car.  He also describes a dry hacking cough as well as a scratchy throat, and it should be noted that he is currently on an ACE inhibitor.  The patient is unsure if he has ever been on amiodarone, and denies a history of chronic UTIs or Macrodantin use.  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  There were workers in his same area who were later diagnosed with asbestosis.  The patient has no personal history of significant arthritis symptoms are consistent basis, but his mother did have rheumatoid arthritis and his sister was diagnosed with lupus.  The patient denies any definite aspiration with eating or drinking.   Review of Systems  Constitutional: Positive for appetite change and unexpected weight change. Negative for fever.  HENT: Positive for congestion, sore throat ( scratchy), rhinorrhea, sneezing, voice change, postnasal drip and sinus pressure. Negative for ear pain, nosebleeds, trouble swallowing and dental problem.   Eyes: Negative for redness and  itching.  Respiratory: Positive for cough and shortness of breath. Negative for choking, chest tightness and wheezing.   Cardiovascular: Positive for palpitations. Negative for leg swelling.  Gastrointestinal: Positive for nausea, abdominal pain ( ulcerative colitis) and diarrhea. Negative for vomiting.       Acid heartburn/indigestion  Genitourinary: Negative for dysuria.  Musculoskeletal: Positive for joint swelling, arthralgias and gait problem (problems with Gout and hip pain).       Hand/feet swelling  Skin: Negative for rash.  Neurological: Positive for tremors. Negative for headaches.  Hematological: Does not bruise/bleed easily.  Psychiatric/Behavioral: Negative for dysphoric mood. The patient is nervous/anxious.        Objective:   Physical Exam Constitutional:  Well developed, no acute distress  HENT:  Nares patent without discharge  Oropharynx without exudate, palate and uvula are normal  Eyes:  Perrla, eomi, no scleral icterus  Neck:  No JVD, no TMG  Cardiovascular:  Normal rate, regular rhythm, no rubs or gallops.  No murmurs        Intact distal pulses  Pulmonary :  Normal breath sounds, no stridor or respiratory distress   No rhonchi, or wheezing.  +crackles both bases.   Abdominal:  Soft, nondistended, bowel sounds present.  No tenderness noted.   Musculoskeletal:  minimal lower extremity edema noted.  Lymph Nodes:  No cervical lymphadenopathy noted  Skin:  No cyanosis noted  Neurologic:  Alert, appropriate, moves all 4 extremities without obvious deficit.  Assessment & Plan:

## 2012-11-08 ENCOUNTER — Telehealth: Payer: Self-pay | Admitting: Cardiology

## 2012-11-08 NOTE — Telephone Encounter (Signed)
Advised daughter 

## 2012-11-08 NOTE — Telephone Encounter (Signed)
New problem:   Is patient clear from a cardiac stand point to proceed with his colonoscopy .  Dr. Marina Goodell - GI dept.

## 2012-11-08 NOTE — Telephone Encounter (Signed)
The patient is cleared from a cardiac standpoint to proceed with colonoscopy.

## 2012-11-08 NOTE — Telephone Encounter (Signed)
Will forward to  Dr. Brackbill for review 

## 2012-11-09 NOTE — Telephone Encounter (Signed)
Great. Appointment with pre-visit nurse to set up for colonoscopy (ulcerative colitis, screening are the codes)

## 2012-11-09 NOTE — Telephone Encounter (Signed)
Spoke with pts daughter and scheduled previsit and colon. Pt aware of appt dates and times.

## 2012-11-10 ENCOUNTER — Ambulatory Visit (INDEPENDENT_AMBULATORY_CARE_PROVIDER_SITE_OTHER)
Admission: RE | Admit: 2012-11-10 | Discharge: 2012-11-10 | Disposition: A | Discharge status: Home / Self Care | Payer: Medicare Other | Source: Ambulatory Visit | Attending: Pulmonary Disease | Admitting: Pulmonary Disease

## 2012-11-10 DIAGNOSIS — R0989 Other specified symptoms and signs involving the circulatory and respiratory systems: Secondary | ICD-10-CM

## 2012-11-10 DIAGNOSIS — R0600 Dyspnea, unspecified: Secondary | ICD-10-CM

## 2012-11-10 DIAGNOSIS — J849 Interstitial pulmonary disease, unspecified: Secondary | ICD-10-CM

## 2012-11-10 DIAGNOSIS — J8410 Pulmonary fibrosis, unspecified: Secondary | ICD-10-CM

## 2012-11-14 ENCOUNTER — Ambulatory Visit (HOSPITAL_COMMUNITY)
Admission: RE | Admit: 2012-11-14 | Discharge: 2012-11-14 | Disposition: A | Discharge status: Home / Self Care | Payer: Medicare Other | Source: Ambulatory Visit | Attending: Pulmonary Disease | Admitting: Pulmonary Disease

## 2012-11-14 DIAGNOSIS — R0600 Dyspnea, unspecified: Secondary | ICD-10-CM

## 2012-11-14 DIAGNOSIS — R0989 Other specified symptoms and signs involving the circulatory and respiratory systems: Secondary | ICD-10-CM | POA: Insufficient documentation

## 2012-11-14 DIAGNOSIS — R0609 Other forms of dyspnea: Secondary | ICD-10-CM | POA: Insufficient documentation

## 2012-11-14 MED ORDER — ALBUTEROL SULFATE (5 MG/ML) 0.5% IN NEBU
2.5000 mg | INHALATION_SOLUTION | Freq: Once | RESPIRATORY_TRACT | Status: AC
  Administered 2012-11-14: 2.5 mg via RESPIRATORY_TRACT

## 2012-12-01 ENCOUNTER — Encounter: Payer: Self-pay | Admitting: Pulmonary Disease

## 2012-12-01 ENCOUNTER — Other Ambulatory Visit (INDEPENDENT_AMBULATORY_CARE_PROVIDER_SITE_OTHER): Payer: Medicare Other

## 2012-12-01 ENCOUNTER — Ambulatory Visit (INDEPENDENT_AMBULATORY_CARE_PROVIDER_SITE_OTHER): Payer: Medicare Other | Admitting: Pulmonary Disease

## 2012-12-01 VITALS — BP 128/74 | HR 53 | Temp 97.8°F | Ht 71.0 in | Wt 188.8 lb

## 2012-12-01 DIAGNOSIS — J849 Interstitial pulmonary disease, unspecified: Secondary | ICD-10-CM

## 2012-12-01 DIAGNOSIS — J8410 Pulmonary fibrosis, unspecified: Secondary | ICD-10-CM

## 2012-12-01 LAB — CARDIAC PANEL
Relative Index: 1.7 calc (ref 0.0–2.5)
Total CK: 72 U/L (ref 7–232)

## 2012-12-01 LAB — C-REACTIVE PROTEIN: CRP: <0.5 mg/dL (ref 0.5–20.0)

## 2012-12-01 NOTE — Assessment & Plan Note (Signed)
The patient has interstitial lung disease advise CT is most consistent with usual interstitial pneumonitis.  I have explained to him this can be associated with autoimmune disease, and also with idiopathic pulmonary fibrosis.  The patient does have a family history of autoimmune disease, and we'll therefore check the appropriate blood work.  More than likely this represents IPF.  I've had a long discussion with him about the various forms of interstitial lung disease, and also the available treatments.  He understands if this is IPF, there is currently no approved treatment available.  We'll check an autoimmune panel, and call the patient with results.  If these are unremarkable, I would like to see him back in approximately 6 months to see how things are going.  I have also discussed with him the possibility of investigational studies at [facility] if this appears to be IPF.

## 2012-12-01 NOTE — Patient Instructions (Addendum)
Will check autoimmune bloodwork and call you with results. Stay as active as possible, and work on conditioning program.  If you feel you cannot do on your own, would consider pulmonary rehab program at the hospital. If remaining stable, would like to see you back in 6mos to check on your progress.

## 2012-12-01 NOTE — Progress Notes (Signed)
  Subjective:    Patient ID: Chris Stokes, male    DOB: [birth year], 79 y.o.   MRN: [id number]  HPI Patient comes in today for followup after his PFTs and CT chest.  He has known interstitial lung disease based on chest x-ray, and probably has dated back to 2004.  His CT chest showed classic changes of UIP with subpleural fibrosis, traction bronchiectasis, and honeycombing.  His PFTs showed no airflow obstruction, mild restriction, and a severe decrease in his diffusion capacity.  I have reviewed the studies with him in detail, and answered all of his questions.   Review of Systems  Constitutional: Negative for fever and unexpected weight change.  HENT: Positive for sore throat ( sore off and on with some congestion.Marland Kitchenthroat clearing more frequent. ) and rhinorrhea. Negative for ear pain, nosebleeds, congestion, sneezing, trouble swallowing, dental problem, postnasal drip and sinus pressure.   Eyes: Negative for redness and itching.  Respiratory: Positive for shortness of breath. Negative for cough, chest tightness and wheezing.   Cardiovascular: Negative for palpitations and leg swelling.  Gastrointestinal: Positive for nausea ( patient states that he has been haivng Nausea symptoms x 1 year+ but has not had an episode in last 3 month.). Negative for vomiting.  Genitourinary: Negative for dysuria.  Musculoskeletal: Negative for joint swelling.  Skin: Negative for rash.  Neurological: Negative for headaches.  Hematological: Does not bruise/bleed easily.  Psychiatric/Behavioral: Negative for dysphoric mood. The patient is not nervous/anxious.        Objective:   Physical Exam Well-developed male in no acute distress Nose without purulence or discharge noted Neck without lymphadenopathy or thyromegaly Chest with crackles in both bases, no wheezing Cardiac exam with regular rate and rhythm Lower extremities without edema, no cyanosis Alert and oriented, moves all 4 extremities.         Assessment & Plan:

## 2012-12-04 LAB — JO-1 ANTIBODY-IGG: Jo-1 Antibody, IgG: <1 AU/mL (ref <30)

## 2012-12-08 DIAGNOSIS — J8410 Pulmonary fibrosis, unspecified: Secondary | ICD-10-CM

## 2012-12-08 HISTORY — DX: Pulmonary fibrosis, unspecified: J84.10

## 2012-12-12 ENCOUNTER — Ambulatory Visit (AMBULATORY_SURGERY_CENTER): Payer: Medicare Other

## 2012-12-12 ENCOUNTER — Encounter: Payer: Self-pay | Admitting: Internal Medicine

## 2012-12-12 VITALS — Ht 71.0 in | Wt 192.8 lb

## 2012-12-12 DIAGNOSIS — K5190 Ulcerative colitis, unspecified, without complications: Secondary | ICD-10-CM

## 2012-12-12 DIAGNOSIS — Z86010 Personal history of colonic polyps: Secondary | ICD-10-CM

## 2012-12-12 DIAGNOSIS — Z1211 Encounter for screening for malignant neoplasm of colon: Secondary | ICD-10-CM

## 2012-12-12 MED ORDER — MOVIPREP 100 G PO SOLR: ORAL | Status: DC

## 2012-12-19 ENCOUNTER — Other Ambulatory Visit: Payer: Self-pay | Admitting: *Deleted

## 2012-12-19 DIAGNOSIS — E785 Hyperlipidemia, unspecified: Secondary | ICD-10-CM

## 2012-12-19 MED ORDER — SPIRONOLACTONE 25 MG PO TABS: 12.5000 mg | ORAL_TABLET | ORAL | Status: DC

## 2012-12-19 MED ORDER — ROSUVASTATIN CALCIUM 10 MG PO TABS: 10.0000 mg | ORAL_TABLET | Freq: Every day | ORAL | Status: DC

## 2012-12-22 ENCOUNTER — Ambulatory Visit (AMBULATORY_SURGERY_CENTER): Payer: Medicare Other | Admitting: Internal Medicine

## 2012-12-22 ENCOUNTER — Encounter: Payer: Self-pay | Admitting: Internal Medicine

## 2012-12-22 VITALS — BP 176/91 | HR 47 | Temp 96.3°F | Resp 12 | Ht 71.0 in | Wt 192.0 lb

## 2012-12-22 DIAGNOSIS — Z86010 Personal history of colonic polyps: Secondary | ICD-10-CM

## 2012-12-22 DIAGNOSIS — Z1211 Encounter for screening for malignant neoplasm of colon: Secondary | ICD-10-CM

## 2012-12-22 DIAGNOSIS — K5190 Ulcerative colitis, unspecified, without complications: Secondary | ICD-10-CM

## 2012-12-22 MED ORDER — SODIUM CHLORIDE 0.9 % IV SOLN: 500.0000 mL | INTRAVENOUS | Status: DC

## 2012-12-22 NOTE — Progress Notes (Signed)
VVS. PT A&O X3. Pleased with MAC. Report to [employer]. DRM

## 2012-12-22 NOTE — Progress Notes (Signed)
Patient did not experience any of the following events: a burn prior to discharge; a fall within the facility; wrong site/side/patient/procedure/implant event; or a hospital transfer or hospital admission upon discharge from the facility. (G8907) Patient did not have preoperative order for IV antibiotic SSI prophylaxis. (G8918)  

## 2012-12-22 NOTE — Op Note (Signed)
[facility] [address] [place], [address]   COLONOSCOPY PROCEDURE REPORT  PATIENT: Chris Stokes, Chris Stokes  MR#: [id number] BIRTHDATE: 05/01/1929 , 83  yrs. old GENDER: Male ENDOSCOPIST: Roxy Cedar, MD REFERRED XB:[id number] Program Recall PROCEDURE DATE:  12/22/2012 PROCEDURE:   Colonoscopy, surveillance ASA CLASS:   Class III INDICATIONS:Patient's personal history of adenomatous colon polyps and High risk patient with previously diagnosed UC left-sided colitis many years.   Last exam 2010 MEDICATIONS: MAC sedation, administered by CRNA and propofol (Diprivan) 200mg  IV  DESCRIPTION OF PROCEDURE:   After the risks benefits and alternatives of the procedure were thoroughly explained, informed consent was obtained.  A digital rectal exam revealed no abnormalities of the rectum.   The LB CF-[id number]  endoscope was introduced through the anus and advanced to the cecum, which was identified by both the appendix and ileocecal valve. No adverse events experienced.   The quality of the prep was excellent, using MoviPrep  The instrument was then slowly withdrawn as the colon was fully examined.      COLON FINDINGS: A normal appearing cecum, ileocecal valve, and appendiceal orifice were identified.  The ascending, hepatic flexure, transverse, splenic flexure, descending, sigmoid colon and rectum appeared unremarkable.  No polyps or cancers were seen. Retroflexed views revealed no abnormalities. The time to cecum=2 minutes 59 seconds.  Withdrawal time=6 minutes 42 seconds.  The scope was withdrawn and the procedure completed. COMPLICATIONS: There were no complications.  ENDOSCOPIC IMPRESSION: 1. Normal colon  RECOMMENDATIONS: 1. Continue current medications   eSigned:  Roxy Cedar, MD 12/22/2012 5:27 PM   cc: Linda Hedges.  Plotnikov, MD and The Patient

## 2012-12-22 NOTE — Patient Instructions (Signed)
YOU HAD AN ENDOSCOPIC PROCEDURE TODAY AT THE [facility]: Refer to the procedure report that was given to you for any specific questions about what was found during the examination.  If the procedure report does not answer your questions, please call your gastroenterologist to clarify.  If you requested that your care partner not be given the details of your procedure findings, then the procedure report has been included in a sealed envelope for you to review at your convenience later.  YOU SHOULD EXPECT: Some feelings of bloating in the abdomen. Passage of more gas than usual.  Walking can help get rid of the air that was put into your GI tract during the procedure and reduce the bloating. If you had a lower endoscopy (such as a colonoscopy or flexible sigmoidoscopy) you may notice spotting of blood in your stool or on the toilet paper. If you underwent a bowel prep for your procedure, then you may not have a normal bowel movement for a few days.  DIET: Your first meal following the procedure should be a light meal and then it is ok to progress to your normal diet.  A half-sandwich or bowl of soup is an example of a good first meal.  Heavy or fried foods are harder to digest and may make you feel nauseous or bloated.  Likewise meals heavy in dairy and vegetables can cause extra gas to form and this can also increase the bloating.  Drink plenty of fluids but you should avoid alcoholic beverages for 24 hours.  ACTIVITY: Your care partner should take you home directly after the procedure.  You should plan to take it easy, moving slowly for the rest of the day.  You can resume normal activity the day after the procedure however you should NOT DRIVE or use heavy machinery for 24 hours (because of the sedation medicines used during the test).    SYMPTOMS TO REPORT IMMEDIATELY: A gastroenterologist can be reached at any hour.  During normal business hours, 8:30 AM to 5:00 PM Monday through Friday,  call [phone].  After hours and on weekends, please call the GI answering service at [phone] who will take a message and have the physician on call contact you.   Following lower endoscopy (colonoscopy or flexible sigmoidoscopy):  Excessive amounts of blood in the stool  Significant tenderness or worsening of abdominal pains  Swelling of the abdomen that is new, acute  Fever of 100F or higher    FOLLOW UP: If any biopsies were taken you will be contacted by phone or by letter within the next 1-3 weeks.  Call your gastroenterologist if you have not heard about the biopsies in 3 weeks.  Our staff will call the home number listed on your records the next business day following your procedure to check on you and address any questions or concerns that you may have at that time regarding the information given to you following your procedure. This is a courtesy call and so if there is no answer at the home number and we have not heard from you through the emergency physician on call, we will assume that you have returned to your regular daily activities without incident.  SIGNATURES/CONFIDENTIALITY: You and/or your care partner have signed paperwork which will be entered into your electronic medical record.  These signatures attest to the fact that that the information above on your After Visit Summary has been reviewed and is understood.  Full responsibility of the confidentiality   of this discharge information lies with you and/or your care-partner.     

## 2012-12-25 ENCOUNTER — Telehealth: Payer: Self-pay | Admitting: *Deleted

## 2012-12-25 NOTE — Telephone Encounter (Signed)
  Follow up Call-  Call back number 12/22/2012  Post procedure Call Back phone  # [phone]  Permission to leave phone message No     Patient questions:  Do you have a fever, pain , or abdominal swelling? no Pain Score  0 *  Have you tolerated food without any problems? yes  Have you been able to return to your normal activities? yes  Do you have any questions about your discharge instructions: Diet   no Medications  no Follow up visit  no  Do you have questions or concerns about your Care? no  Actions: * If pain score is 4 or above: No action needed, pain <4.

## 2013-01-04 ENCOUNTER — Encounter: Payer: Self-pay | Admitting: Internal Medicine

## 2013-01-04 ENCOUNTER — Ambulatory Visit (INDEPENDENT_AMBULATORY_CARE_PROVIDER_SITE_OTHER): Payer: Medicare Other | Admitting: Internal Medicine

## 2013-01-04 VITALS — BP 140/92 | HR 80 | Temp 97.0°F | Resp 16 | Wt 187.0 lb

## 2013-01-04 DIAGNOSIS — I10 Essential (primary) hypertension: Secondary | ICD-10-CM

## 2013-01-04 DIAGNOSIS — J8410 Pulmonary fibrosis, unspecified: Secondary | ICD-10-CM

## 2013-01-04 DIAGNOSIS — J849 Interstitial pulmonary disease, unspecified: Secondary | ICD-10-CM

## 2013-01-04 DIAGNOSIS — E785 Hyperlipidemia, unspecified: Secondary | ICD-10-CM

## 2013-01-04 DIAGNOSIS — K5190 Ulcerative colitis, unspecified, without complications: Secondary | ICD-10-CM

## 2013-01-04 DIAGNOSIS — G252 Other specified forms of tremor: Secondary | ICD-10-CM

## 2013-01-04 DIAGNOSIS — I259 Chronic ischemic heart disease, unspecified: Secondary | ICD-10-CM

## 2013-01-04 MED ORDER — PRIMIDONE 50 MG PO TABS: 50.0000 mg | ORAL_TABLET | Freq: Three times a day (TID) | ORAL | Status: DC

## 2013-01-04 MED ORDER — LOSARTAN POTASSIUM 100 MG PO TABS: 100.0000 mg | ORAL_TABLET | Freq: Every day | ORAL | Status: DC

## 2013-01-04 NOTE — Patient Instructions (Signed)
Stop Ramipril, start Losartan

## 2013-01-04 NOTE — Assessment & Plan Note (Signed)
Continue with current prescription therapy as reflected on the Med list.  

## 2013-01-04 NOTE — Assessment & Plan Note (Signed)
Pimidone Neurol consult

## 2013-01-04 NOTE — Progress Notes (Signed)
   Subjective:    HPI  F/u gout - better, pulmonary fibrosis The patient presents for a follow-up of  chronic hypertension, colitis, chronic dyslipidemia, CAD controlled with medicines.  C/o tremor C/o cough and throat congestion  BP Readings from Last 3 Encounters:  01/04/13 140/92  12/22/12 176/91  12/01/12 128/74   Wt Readings from Last 3 Encounters:  01/04/13 187 lb (84.823 kg)  12/22/12 192 lb (87.091 kg)  12/12/12 192 lb 12.8 oz (87.454 kg)      Review of Systems  Constitutional: Positive for fatigue. Negative for appetite change and unexpected weight change.  HENT: Positive for congestion and rhinorrhea. Negative for nosebleeds, sore throat, sneezing, trouble swallowing and neck pain.   Eyes: Negative for itching and visual disturbance.  Respiratory: Positive for cough and shortness of breath.   Cardiovascular: Negative for chest pain, palpitations and leg swelling.  Gastrointestinal: Negative for nausea, diarrhea, blood in stool and abdominal distention.  Genitourinary: Negative for frequency and hematuria.  Musculoskeletal: Negative for back pain, joint swelling and gait problem.  Skin: Negative for rash and wound.  Neurological: Positive for tremors. Negative for dizziness, speech difficulty, weakness, light-headedness and headaches.  Psychiatric/Behavioral: Negative for suicidal ideas, sleep disturbance, dysphoric mood and agitation. The patient is nervous/anxious.        Objective:   Physical Exam  Constitutional: He is oriented to person, place, and time. He appears well-developed. No distress.  HENT:  Mouth/Throat: Oropharynx is clear and moist.  Eyes: Conjunctivae normal are normal. Pupils are equal, round, and reactive to light.  Neck: Normal range of motion. No JVD present. No thyromegaly present.  Cardiovascular: Normal rate, regular rhythm, normal heart sounds and intact distal pulses.  Exam reveals no gallop and no friction rub.   No murmur  heard. Pulmonary/Chest: Effort normal and breath sounds normal. No respiratory distress. He has no wheezes. He has no rales. He exhibits no tenderness.  Abdominal: Soft. Bowel sounds are normal. He exhibits no distension and no mass. There is no tenderness. There is no rebound and no guarding.  Musculoskeletal: Normal range of motion. He exhibits no edema and no tenderness.  Lymphadenopathy:    He has no cervical adenopathy.  Neurological: He is alert and oriented to person, place, and time. He has normal reflexes. No cranial nerve deficit. He exhibits normal muscle tone. Coordination abnormal.  Skin: Skin is warm and dry. No rash noted. No erythema.  Psychiatric: He has a normal mood and affect. His behavior is normal. Judgment and thought content normal.  Intentional tremor, ataxia   Lab Results  Component Value Date   WBC 8.3 10/02/2012   HGB 13.9 10/02/2012   HCT 41.2 10/02/2012   PLT 133.0* 10/02/2012   GLUCOSE 94 10/02/2012   CHOL 137 06/02/2012   TRIG 112.0 06/02/2012   HDL 36.30* 06/02/2012   LDLCALC 78 06/02/2012   ALT 14 06/02/2012   AST 19 06/02/2012   NA 136 10/02/2012   K 4.8 10/02/2012   CL 105 10/02/2012   CREATININE 1.4 10/02/2012   BUN 29* 10/02/2012   CO2 24 10/02/2012   TSH 18.60* 06/02/2012   PSA 1.91 11/09/2010        Assessment & Plan:

## 2013-01-04 NOTE — Assessment & Plan Note (Addendum)
Discussed  D/c ramipril

## 2013-02-06 ENCOUNTER — Other Ambulatory Visit (INDEPENDENT_AMBULATORY_CARE_PROVIDER_SITE_OTHER): Payer: Medicare Other

## 2013-02-06 ENCOUNTER — Encounter: Payer: Self-pay | Admitting: Internal Medicine

## 2013-02-06 ENCOUNTER — Ambulatory Visit (INDEPENDENT_AMBULATORY_CARE_PROVIDER_SITE_OTHER): Payer: Medicare Other | Admitting: Internal Medicine

## 2013-02-06 VITALS — BP 138/90 | HR 72 | Temp 98.2°F | Resp 16 | Wt 185.0 lb

## 2013-02-06 DIAGNOSIS — I10 Essential (primary) hypertension: Secondary | ICD-10-CM

## 2013-02-06 DIAGNOSIS — M109 Gout, unspecified: Secondary | ICD-10-CM

## 2013-02-06 DIAGNOSIS — F0390 Unspecified dementia without behavioral disturbance: Secondary | ICD-10-CM | POA: Insufficient documentation

## 2013-02-06 LAB — BASIC METABOLIC PANEL
Calcium: 9.6 mg/dL (ref 8.4–10.5)
GFR: 54.32 mL/min — ABNORMAL LOW (ref >60.00)
Sodium: 136 mEq/L (ref 135–145)

## 2013-02-06 MED ORDER — DONEPEZIL HCL 5 MG PO TABS: 5.0000 mg | ORAL_TABLET | Freq: Every day | ORAL | Status: DC

## 2013-02-06 MED ORDER — LOSARTAN POTASSIUM 100 MG PO TABS: 100.0000 mg | ORAL_TABLET | Freq: Every day | ORAL | Status: DC

## 2013-02-06 MED ORDER — PRIMIDONE 50 MG PO TABS: 50.0000 mg | ORAL_TABLET | Freq: Three times a day (TID) | ORAL | Status: DC

## 2013-02-06 MED ORDER — DONEPEZIL HCL 5 MG PO TABS: 5.0000 mg | ORAL_TABLET | Freq: Every evening | ORAL | Status: DC | PRN

## 2013-02-06 MED ORDER — ALLOPURINOL 100 MG PO TABS: 100.0000 mg | ORAL_TABLET | Freq: Every day | ORAL | Status: DC

## 2013-02-06 NOTE — Assessment & Plan Note (Signed)
Reduce stress if possible Start Aricept 5 mg at hs

## 2013-02-06 NOTE — Progress Notes (Signed)
   Subjective:    HPI  F/u gout - better, pulmonary fibrosis The patient presents for a follow-up of  chronic hypertension, colitis, chronic dyslipidemia, CAD controlled with medicines.  F/u on tremor - better; cough resolved C/o runny nose when eating... Per family - some loss of functioning and memory issues  BP Readings from Last 3 Encounters:  02/06/13 138/90  01/04/13 140/92  12/22/12 176/91   Wt Readings from Last 3 Encounters:  02/06/13 185 lb (83.915 kg)  01/04/13 187 lb (84.823 kg)  12/22/12 192 lb (87.091 kg)      Review of Systems  Constitutional: Positive for fatigue. Negative for appetite change and unexpected weight change.  HENT: Positive for congestion and rhinorrhea. Negative for nosebleeds, sore throat, sneezing, trouble swallowing and neck pain.   Eyes: Negative for itching and visual disturbance.  Respiratory: Positive for cough and shortness of breath.   Cardiovascular: Negative for chest pain, palpitations and leg swelling.  Gastrointestinal: Negative for nausea, diarrhea, blood in stool and abdominal distention.  Genitourinary: Negative for frequency and hematuria.  Musculoskeletal: Negative for back pain, joint swelling and gait problem.  Skin: Negative for rash and wound.  Neurological: Positive for tremors. Negative for dizziness, speech difficulty, weakness, light-headedness and headaches.  Psychiatric/Behavioral: Negative for suicidal ideas, sleep disturbance, dysphoric mood and agitation. The patient is nervous/anxious.        Objective:   Physical Exam  Constitutional: He is oriented to person, place, and time. He appears well-developed. No distress.  NAD A little dyspneic  HENT:  Mouth/Throat: Oropharynx is clear and moist.  Eyes: Conjunctivae are normal. Pupils are equal, round, and reactive to light.  Neck: Normal range of motion. No JVD present. No thyromegaly present.  Cardiovascular: Normal rate, regular rhythm, normal heart sounds  and intact distal pulses.  Exam reveals no gallop and no friction rub.   No murmur heard. Pulmonary/Chest: Effort normal and breath sounds normal. No respiratory distress. He has no wheezes. He has no rales. He exhibits no tenderness.  Abdominal: Soft. Bowel sounds are normal. He exhibits no distension and no mass. There is no tenderness. There is no rebound and no guarding.  Musculoskeletal: Normal range of motion. He exhibits no edema and no tenderness.  Lymphadenopathy:    He has no cervical adenopathy.  Neurological: He is alert and oriented to person, place, and time. He has normal reflexes. No cranial nerve deficit. He exhibits normal muscle tone. Coordination abnormal.  Skin: Skin is warm and dry. No rash noted. No erythema.  Psychiatric: He has a normal mood and affect. His behavior is normal. Judgment and thought content normal.  Intentional tremor, ataxia - better Recalls 1/3 in 5 min Serial 7s w/one error     Lab Results  Component Value Date   WBC 8.3 10/02/2012   HGB 13.9 10/02/2012   HCT 41.2 10/02/2012   PLT 133.0* 10/02/2012   GLUCOSE 94 10/02/2012   CHOL 137 06/02/2012   TRIG 112.0 06/02/2012   HDL 36.30* 06/02/2012   LDLCALC 78 06/02/2012   ALT 14 06/02/2012   AST 19 06/02/2012   NA 136 10/02/2012   K 4.8 10/02/2012   CL 105 10/02/2012   CREATININE 1.4 10/02/2012   BUN 29* 10/02/2012   CO2 24 10/02/2012   TSH 18.60* 06/02/2012   PSA 1.91 11/09/2010        Assessment & Plan:

## 2013-02-14 ENCOUNTER — Telehealth: Payer: Self-pay | Admitting: Internal Medicine

## 2013-02-14 NOTE — Telephone Encounter (Signed)
Caller: Ron/Patient; Phone: [phone]; Reason for Call: Patient states he was seen in the office last week 02/06/13 and all medication were received at the pharmacy EXCEPT the nasal spray.  Pt states he would like to have the Rx nasal spray sent to [facility] unless it is a 90 day supply then send to [facility].  PLEASE F/U WITH QUESTIONS, THANK YOU.

## 2013-02-14 NOTE — Telephone Encounter (Signed)
Pt states that MD suggested he start a nasal spray at [facility] but no medication was prescribed and there is no mention either in [facility] notes, please advise

## 2013-02-15 NOTE — Telephone Encounter (Signed)
Atrovent nasal Thx

## 2013-02-19 MED ORDER — IPRATROPIUM BROMIDE 0.06 % NA SOLN: 2.0000 | Freq: Four times a day (QID) | NASAL | Status: AC | PRN

## 2013-05-09 ENCOUNTER — Ambulatory Visit: Payer: Medicare Other | Admitting: Internal Medicine

## 2013-05-17 ENCOUNTER — Encounter: Payer: Self-pay | Admitting: Internal Medicine

## 2013-05-17 ENCOUNTER — Ambulatory Visit (INDEPENDENT_AMBULATORY_CARE_PROVIDER_SITE_OTHER): Payer: Medicare Other | Admitting: Internal Medicine

## 2013-05-17 VITALS — BP 140/90 | HR 80 | Temp 96.7°F | Resp 16 | Wt 186.0 lb

## 2013-05-17 DIAGNOSIS — J8410 Pulmonary fibrosis, unspecified: Secondary | ICD-10-CM

## 2013-05-17 DIAGNOSIS — J849 Interstitial pulmonary disease, unspecified: Secondary | ICD-10-CM

## 2013-05-17 DIAGNOSIS — F0390 Unspecified dementia without behavioral disturbance: Secondary | ICD-10-CM

## 2013-05-17 DIAGNOSIS — E875 Hyperkalemia: Secondary | ICD-10-CM

## 2013-05-17 DIAGNOSIS — E039 Hypothyroidism, unspecified: Secondary | ICD-10-CM

## 2013-05-17 DIAGNOSIS — G252 Other specified forms of tremor: Secondary | ICD-10-CM

## 2013-05-17 DIAGNOSIS — G250 Essential tremor: Secondary | ICD-10-CM

## 2013-05-17 DIAGNOSIS — I10 Essential (primary) hypertension: Secondary | ICD-10-CM

## 2013-05-17 MED ORDER — LOSARTAN POTASSIUM 100 MG PO TABS: 100.0000 mg | ORAL_TABLET | Freq: Every day | ORAL | Status: DC

## 2013-05-17 MED ORDER — DONEPEZIL HCL 10 MG PO TABS: 10.0000 mg | ORAL_TABLET | Freq: Every day | ORAL | Status: DC

## 2013-05-17 NOTE — Progress Notes (Signed)
   Subjective:    HPI  C/o S x 1 d - better F/u gout - better, pulmonary fibrosis The patient presents for a follow-up of  chronic hypertension, colitis, chronic dyslipidemia, CAD controlled with medicines.  F/u on tremor - better; cough resolved F/u runny nose when eating... - better Per family - some loss of functioning and memory issues  BP Readings from Last 3 Encounters:  05/17/13 140/90  02/06/13 138/90  01/04/13 140/92   Wt Readings from Last 3 Encounters:  05/17/13 186 lb (84.369 kg)  02/06/13 185 lb (83.915 kg)  01/04/13 187 lb (84.823 kg)      Review of Systems  Constitutional: Positive for fatigue. Negative for appetite change and unexpected weight change.  HENT: Positive for congestion and rhinorrhea. Negative for nosebleeds, sore throat, sneezing, trouble swallowing and neck pain.   Eyes: Negative for itching and visual disturbance.  Respiratory: Positive for cough and shortness of breath.   Cardiovascular: Negative for chest pain, palpitations and leg swelling.  Gastrointestinal: Negative for nausea, diarrhea, blood in stool and abdominal distention.  Genitourinary: Negative for frequency and hematuria.  Musculoskeletal: Negative for back pain, joint swelling and gait problem.  Skin: Negative for rash and wound.  Neurological: Positive for tremors. Negative for dizziness, speech difficulty, weakness, light-headedness and headaches.  Psychiatric/Behavioral: Negative for suicidal ideas, sleep disturbance, dysphoric mood and agitation. The patient is nervous/anxious.        Objective:   Physical Exam  Constitutional: He is oriented to person, place, and time. He appears well-developed. No distress.  NAD A little dyspneic  HENT:  Mouth/Throat: Oropharynx is clear and moist.  Eyes: Conjunctivae are normal. Pupils are equal, round, and reactive to light.  Neck: Normal range of motion. No JVD present. No thyromegaly present.  Cardiovascular: Normal rate,  regular rhythm, normal heart sounds and intact distal pulses.  Exam reveals no gallop and no friction rub.   No murmur heard. Pulmonary/Chest: Effort normal and breath sounds normal. No respiratory distress. He has no wheezes. He has no rales. He exhibits no tenderness.  Abdominal: Soft. Bowel sounds are normal. He exhibits no distension and no mass. There is no tenderness. There is no rebound and no guarding.  Musculoskeletal: Normal range of motion. He exhibits no edema and no tenderness.  Lymphadenopathy:    He has no cervical adenopathy.  Neurological: He is alert and oriented to person, place, and time. He has normal reflexes. No cranial nerve deficit. He exhibits normal muscle tone. Coordination abnormal.  Skin: Skin is warm and dry. No rash noted. No erythema.  Psychiatric: He has a normal mood and affect. His behavior is normal. Judgment and thought content normal.  Intentional tremor, ataxia - better Recalls 1/3 in 5 min Serial 7s w/one error     Lab Results  Component Value Date   WBC 8.3 10/02/2012   HGB 13.9 10/02/2012   HCT 41.2 10/02/2012   PLT 133.0* 10/02/2012   GLUCOSE 85 02/06/2013   CHOL 137 06/02/2012   TRIG 112.0 06/02/2012   HDL 36.30* 06/02/2012   LDLCALC 78 06/02/2012   ALT 14 06/02/2012   AST 19 06/02/2012   NA 136 02/06/2013   K 4.8 02/06/2013   CL 107 02/06/2013   CREATININE 1.3 02/06/2013   BUN 26* 02/06/2013   CO2 23 02/06/2013   TSH 18.60* 06/02/2012   PSA 1.91 11/09/2010        Assessment & Plan:

## 2013-05-17 NOTE — Assessment & Plan Note (Signed)
No relapse 

## 2013-05-17 NOTE — Assessment & Plan Note (Signed)
Increase Aricept to 10 mg/d

## 2013-05-17 NOTE — Assessment & Plan Note (Signed)
Doing ok.

## 2013-05-17 NOTE — Assessment & Plan Note (Signed)
Continue with current prescription therapy as reflected on the Med list.  

## 2013-05-31 ENCOUNTER — Ambulatory Visit (INDEPENDENT_AMBULATORY_CARE_PROVIDER_SITE_OTHER): Payer: Medicare Other | Admitting: Pulmonary Disease

## 2013-05-31 ENCOUNTER — Encounter: Payer: Self-pay | Admitting: Pulmonary Disease

## 2013-05-31 ENCOUNTER — Telehealth: Payer: Self-pay

## 2013-05-31 VITALS — BP 112/70 | HR 55 | Temp 96.6°F | Ht 71.0 in | Wt 185.6 lb

## 2013-05-31 DIAGNOSIS — J849 Interstitial pulmonary disease, unspecified: Secondary | ICD-10-CM

## 2013-05-31 DIAGNOSIS — J8410 Pulmonary fibrosis, unspecified: Secondary | ICD-10-CM

## 2013-05-31 MED ORDER — MESALAMINE 400 MG PO CPDR: 1600.0000 mg | DELAYED_RELEASE_CAPSULE | Freq: Three times a day (TID) | ORAL | Status: DC

## 2013-05-31 NOTE — Assessment & Plan Note (Signed)
The patient has been doing better overall since being on a conditioning program.  He does not have any significant cough or lower extremity edema.  I have discussed with him the expanded access program for pirfenidone, and he is interested in pursuing.  His DLCO was too low at his last check to qualify, and therefore will repeat his PFTs since he has been on a conditioning program.  I will call him with the results, and see if he can be enrolled in the trial.

## 2013-05-31 NOTE — Telephone Encounter (Signed)
Refilled [facility] °

## 2013-05-31 NOTE — Patient Instructions (Addendum)
Will schedule for breathing studies at your convenience.  Please call back when you have your schedule available.  Will call you with results. Keep working on Product manager.

## 2013-05-31 NOTE — Progress Notes (Signed)
  Subjective:    Patient ID: Chris Stokes, male    DOB: [birth year], 79 y.o.   MRN: [id number]  HPI The patient comes in today for followup of his interstitial lung disease, felt secondary to idiopathic pulmonary fibrosis.  He has been working on a Product manager consistently, and feels that his exertional tolerance has definitely improved.  He denies any significant dry cough or lower extremity edema.  I have talked with him today in great detail about the pirfenidone trial.   Review of Systems  Constitutional: Negative for fever and unexpected weight change.  HENT: Negative for ear pain, nosebleeds, congestion, sore throat, rhinorrhea, sneezing, trouble swallowing, dental problem, postnasal drip and sinus pressure.   Eyes: Negative for redness and itching.  Respiratory: Negative for cough, chest tightness, shortness of breath and wheezing.   Cardiovascular: Negative for palpitations and leg swelling.  Gastrointestinal: Negative for nausea and vomiting.  Genitourinary: Negative for dysuria.  Musculoskeletal: Negative for joint swelling.  Skin: Negative for rash.  Neurological: Negative for headaches.  Hematological: Does not bruise/bleed easily.  Psychiatric/Behavioral: Negative for dysphoric mood. The patient is not nervous/anxious.        Objective:   Physical Exam Well-developed male in no acute distress Nose without purulence or discharge noted Neck without lymphadenopathy or thyromegaly  chest with crackles one third of the way up bilaterally Cardiac exam with regular rate and rhythm Lower extremities without edema, no cyanosis Alert and oriented, moves all 4 extremities.       Assessment & Plan:

## 2013-06-05 ENCOUNTER — Telehealth: Payer: Self-pay

## 2013-06-05 NOTE — Telephone Encounter (Signed)
Spoke with pharmacist - pharmacy does not have e-script and had not received the refill I sent.  Gave [facility] information to the pharmacist over the phone

## 2013-06-27 ENCOUNTER — Ambulatory Visit (INDEPENDENT_AMBULATORY_CARE_PROVIDER_SITE_OTHER): Payer: Medicare Other | Admitting: Pulmonary Disease

## 2013-06-27 DIAGNOSIS — J8410 Pulmonary fibrosis, unspecified: Secondary | ICD-10-CM

## 2013-06-27 DIAGNOSIS — J849 Interstitial pulmonary disease, unspecified: Secondary | ICD-10-CM

## 2013-06-27 LAB — PULMONARY FUNCTION TEST

## 2013-06-27 NOTE — Progress Notes (Signed)
PFT done today. 

## 2013-07-14 ENCOUNTER — Telehealth: Payer: Self-pay | Admitting: Pulmonary Disease

## 2013-07-14 NOTE — Telephone Encounter (Signed)
Please let pt know that his pfts show that his total lung capacity is stable from last year, and his diffusion capacity is improved from last year.  However, it is still too low to be enrolled in the drug study we discussed.  The medication may be available at the end of the year on the open market.  Will let him know.  Keep f/u visit with me.

## 2013-07-18 NOTE — Telephone Encounter (Signed)
Pt advised. Khaylee Mcevoy, CMA  

## 2013-07-18 NOTE — Telephone Encounter (Signed)
Chris Stokes just spoke to pt- gave him results. No call back needed now (pt had just called to get results). [name]

## 2013-07-24 ENCOUNTER — Other Ambulatory Visit: Payer: Self-pay | Admitting: *Deleted

## 2013-07-24 DIAGNOSIS — I119 Hypertensive heart disease without heart failure: Secondary | ICD-10-CM

## 2013-07-24 MED ORDER — CARVEDILOL 25 MG PO TABS: 12.5000 mg | ORAL_TABLET | Freq: Two times a day (BID) | ORAL | Status: DC

## 2013-07-25 ENCOUNTER — Encounter: Payer: Self-pay | Admitting: Pulmonary Disease

## 2013-08-16 ENCOUNTER — Ambulatory Visit: Payer: Medicare Other | Admitting: Internal Medicine

## 2013-08-23 ENCOUNTER — Encounter: Payer: Self-pay | Admitting: Internal Medicine

## 2013-08-23 ENCOUNTER — Other Ambulatory Visit (INDEPENDENT_AMBULATORY_CARE_PROVIDER_SITE_OTHER): Payer: Medicare Other

## 2013-08-23 ENCOUNTER — Ambulatory Visit (INDEPENDENT_AMBULATORY_CARE_PROVIDER_SITE_OTHER): Payer: Medicare Other | Admitting: Internal Medicine

## 2013-08-23 VITALS — BP 130/90 | HR 52 | Temp 98.3°F | Wt 198.8 lb

## 2013-08-23 DIAGNOSIS — N320 Bladder-neck obstruction: Secondary | ICD-10-CM

## 2013-08-23 DIAGNOSIS — F0390 Unspecified dementia without behavioral disturbance: Secondary | ICD-10-CM

## 2013-08-23 DIAGNOSIS — E039 Hypothyroidism, unspecified: Secondary | ICD-10-CM

## 2013-08-23 DIAGNOSIS — Z23 Encounter for immunization: Secondary | ICD-10-CM

## 2013-08-23 LAB — BASIC METABOLIC PANEL
BUN: 16 mg/dL (ref 6–23)
Calcium: 9.7 mg/dL (ref 8.4–10.5)
Chloride: 104 mEq/L (ref 96–112)
Creatinine, Ser: 1.2 mg/dL (ref 0.4–1.5)
Glucose, Bld: 98 mg/dL (ref 70–99)

## 2013-08-23 LAB — URINALYSIS
Bilirubin Urine: NEGATIVE
Hgb urine dipstick: NEGATIVE
Ketones, ur: NEGATIVE
Leukocytes, UA: NEGATIVE
Specific Gravity, Urine: 1.02 (ref 1.000–1.030)
Urine Glucose: NEGATIVE
Urobilinogen, UA: 0.2 (ref 0.0–1.0)

## 2013-08-23 MED ORDER — SPIRONOLACTONE 25 MG PO TABS: 12.5000 mg | ORAL_TABLET | ORAL | Status: DC

## 2013-08-23 MED ORDER — TAMSULOSIN HCL 0.4 MG PO CAPS: 0.4000 mg | ORAL_CAPSULE | Freq: Every day | ORAL | Status: DC

## 2013-08-23 MED ORDER — LEVOTHYROXINE SODIUM 137 MCG PO TABS: 137.0000 ug | ORAL_TABLET | Freq: Every day | ORAL | Status: DC

## 2013-08-23 NOTE — Assessment & Plan Note (Signed)
9/14 worse Start Flomax Labs D/c Spironolactone or use it qod

## 2013-08-23 NOTE — Assessment & Plan Note (Signed)
Continue with current prescription therapy as reflected on the Med list.  

## 2013-08-23 NOTE — Assessment & Plan Note (Signed)
doing ok

## 2013-08-23 NOTE — Progress Notes (Signed)
Subjective:    Urinary Frequency  This is a chronic problem. The current episode started more than 1 year ago. The problem occurs intermittently. The problem has been gradually worsening. The patient is experiencing no pain. There has been no fever. He is not sexually active. There is no history of pyelonephritis. Associated symptoms include urgency. Pertinent negatives include no frequency, hematuria or nausea. His past medical history is significant for kidney stones.    F/u gout - better, pulmonary fibrosis The patient presents for a follow-up of  chronic hypertension, colitis, chronic dyslipidemia, CAD controlled with medicines.  F/u on tremor - better; cough resolved F/u runny nose when eating... - better Per family - some loss of functioning and memory issues  BP Readings from Last 3 Encounters:  08/23/13 130/90  05/31/13 112/70  05/17/13 140/90   Wt Readings from Last 3 Encounters:  08/23/13 198 lb 12.8 oz (90.175 kg)  05/31/13 185 lb 9.6 oz (84.188 kg)  05/17/13 186 lb (84.369 kg)      Review of Systems  Constitutional: Positive for fatigue. Negative for appetite change and unexpected weight change.  HENT: Positive for congestion and rhinorrhea. Negative for nosebleeds, sore throat, sneezing, trouble swallowing and neck pain.   Eyes: Negative for itching and visual disturbance.  Respiratory: Positive for cough and shortness of breath.   Cardiovascular: Negative for chest pain, palpitations and leg swelling.  Gastrointestinal: Negative for nausea, diarrhea, blood in stool and abdominal distention.  Genitourinary: Positive for urgency. Negative for frequency and hematuria.  Musculoskeletal: Negative for back pain, joint swelling and gait problem.  Skin: Negative for rash and wound.  Neurological: Positive for tremors. Negative for dizziness, speech difficulty, weakness, light-headedness and headaches.  Psychiatric/Behavioral: Negative for suicidal ideas, sleep  disturbance, dysphoric mood and agitation. The patient is nervous/anxious.        Objective:   Physical Exam  Constitutional: He is oriented to person, place, and time. He appears well-developed. No distress.  NAD A little dyspneic  HENT:  Mouth/Throat: Oropharynx is clear and moist.  Eyes: Conjunctivae are normal. Pupils are equal, round, and reactive to light.  Neck: Normal range of motion. No JVD present. No thyromegaly present.  Cardiovascular: Normal rate, regular rhythm, normal heart sounds and intact distal pulses.  Exam reveals no gallop and no friction rub.   No murmur heard. Pulmonary/Chest: Effort normal and breath sounds normal. No respiratory distress. He has no wheezes. He has no rales. He exhibits no tenderness.  Abdominal: Soft. Bowel sounds are normal. He exhibits no distension and no mass. There is no tenderness. There is no rebound and no guarding.  Genitourinary: Rectum normal and penis normal. Guaiac negative stool. No penile tenderness.  1+ prostate  Musculoskeletal: Normal range of motion. He exhibits no edema and no tenderness.  Lymphadenopathy:    He has no cervical adenopathy.  Neurological: He is alert and oriented to person, place, and time. He has normal reflexes. No cranial nerve deficit. He exhibits normal muscle tone. Coordination abnormal.  Skin: Skin is warm and dry. No rash noted. No erythema.  Psychiatric: He has a normal mood and affect. His behavior is normal. Judgment and thought content normal.  Intentional tremor, ataxia - better Recalls 1/3 in 5 min Serial 7s w/one error     Lab Results  Component Value Date   WBC 8.3 10/02/2012   HGB 13.9 10/02/2012   HCT 41.2 10/02/2012   PLT 133.0* 10/02/2012   GLUCOSE 85 02/06/2013   CHOL  137 06/02/2012   TRIG 112.0 06/02/2012   HDL 36.30* 06/02/2012   LDLCALC 78 06/02/2012   ALT 14 06/02/2012   AST 19 06/02/2012   NA 136 02/06/2013   K 4.8 02/06/2013   CL 107 02/06/2013   CREATININE 1.3 02/06/2013   BUN 26*  02/06/2013   CO2 23 02/06/2013   TSH 18.60* 06/02/2012   PSA 1.91 11/09/2010        Assessment & Plan:

## 2013-08-23 NOTE — Patient Instructions (Signed)
Stop Spironolactone to see if urination frequency improved being off of it You can try Flomax if not better

## 2013-10-02 ENCOUNTER — Telehealth: Payer: Self-pay | Admitting: Pulmonary Disease

## 2013-10-02 NOTE — Telephone Encounter (Signed)
I spoke with pt. Advised him do not see where anyone has called him. Nothing further needed

## 2013-10-03 ENCOUNTER — Encounter: Payer: Self-pay | Admitting: Cardiology

## 2013-11-26 ENCOUNTER — Ambulatory Visit: Payer: Medicare Other | Admitting: Internal Medicine

## 2013-11-27 ENCOUNTER — Ambulatory Visit (INDEPENDENT_AMBULATORY_CARE_PROVIDER_SITE_OTHER): Payer: Medicare Other | Admitting: Internal Medicine

## 2013-11-27 ENCOUNTER — Encounter: Payer: Self-pay | Admitting: Internal Medicine

## 2013-11-27 VITALS — BP 140/90 | HR 80 | Temp 97.1°F | Resp 16 | Wt 188.0 lb

## 2013-11-27 DIAGNOSIS — E039 Hypothyroidism, unspecified: Secondary | ICD-10-CM

## 2013-11-27 DIAGNOSIS — G589 Mononeuropathy, unspecified: Secondary | ICD-10-CM

## 2013-11-27 DIAGNOSIS — G252 Other specified forms of tremor: Secondary | ICD-10-CM

## 2013-11-27 DIAGNOSIS — G629 Polyneuropathy, unspecified: Secondary | ICD-10-CM

## 2013-11-27 DIAGNOSIS — G250 Essential tremor: Secondary | ICD-10-CM

## 2013-11-27 MED ORDER — GABAPENTIN 100 MG PO CAPS: ORAL_CAPSULE | ORAL | Status: DC

## 2013-11-27 NOTE — Assessment & Plan Note (Signed)
Neurol ref 

## 2013-11-27 NOTE — Progress Notes (Signed)
Subjective:   C/o legs not working well - "disconnected", stumbling, "restless" at night... They burn. No LBP  Urinary Frequency  This is a chronic problem. The current episode started more than 1 year ago. The problem occurs intermittently. The problem has been gradually worsening. The patient is experiencing no pain. There has been no fever. He is not sexually active. There is no history of pyelonephritis. Associated symptoms include urgency. Pertinent negatives include no frequency, hematuria or nausea. His past medical history is significant for kidney stones.    F/u gout - better, pulmonary fibrosis The patient presents for a follow-up of  chronic hypertension, colitis, chronic dyslipidemia, CAD controlled with medicines.  F/u on tremor - better; cough resolved F/u runny nose when eating... - better Per family - some loss of functioning and memory issues  BP Readings from Last 3 Encounters:  11/27/13 140/90  08/23/13 130/90  05/31/13 112/70   Wt Readings from Last 3 Encounters:  11/27/13 188 lb (85.276 kg)  08/23/13 198 lb 12.8 oz (90.175 kg)  05/31/13 185 lb 9.6 oz (84.188 kg)      Review of Systems  Constitutional: Positive for fatigue. Negative for appetite change and unexpected weight change.  HENT: Positive for congestion and rhinorrhea. Negative for nosebleeds, sneezing, sore throat and trouble swallowing.   Eyes: Negative for itching and visual disturbance.  Respiratory: Positive for cough and shortness of breath.   Cardiovascular: Negative for chest pain, palpitations and leg swelling.  Gastrointestinal: Negative for nausea, diarrhea, blood in stool and abdominal distention.  Genitourinary: Positive for urgency. Negative for frequency and hematuria.  Musculoskeletal: Negative for back pain, gait problem, joint swelling and neck pain.  Skin: Negative for rash and wound.  Neurological: Positive for tremors. Negative for dizziness, speech difficulty, weakness,  light-headedness and headaches.  Psychiatric/Behavioral: Negative for suicidal ideas, sleep disturbance, dysphoric mood and agitation. The patient is nervous/anxious.        Objective:   Physical Exam  Constitutional: He is oriented to person, place, and time. He appears well-developed. No distress.  NAD A little dyspneic  HENT:  Mouth/Throat: Oropharynx is clear and moist.  Eyes: Conjunctivae are normal. Pupils are equal, round, and reactive to light.  Neck: Normal range of motion. No JVD present. No thyromegaly present.  Cardiovascular: Normal rate, regular rhythm, normal heart sounds and intact distal pulses.  Exam reveals no gallop and no friction rub.   No murmur heard. Pulmonary/Chest: Effort normal and breath sounds normal. No respiratory distress. He has no wheezes. He has no rales. He exhibits no tenderness.  Abdominal: Soft. Bowel sounds are normal. He exhibits no distension and no mass. There is no tenderness. There is no rebound and no guarding.  Genitourinary: Rectum normal and penis normal. Guaiac negative stool. No penile tenderness.  1+ prostate  Musculoskeletal: Normal range of motion. He exhibits no edema and no tenderness.  Lymphadenopathy:    He has no cervical adenopathy.  Neurological: He is alert and oriented to person, place, and time. He has normal reflexes. No cranial nerve deficit. He exhibits normal muscle tone. Coordination abnormal.  Skin: Skin is warm and dry. No rash noted. No erythema.  Psychiatric: He has a normal mood and affect. His behavior is normal. Judgment and thought content normal.  Intentional tremor, ataxia - better Recalls 1/3 in 5 min Serial 7s w/one error     Lab Results  Component Value Date   WBC 8.3 10/02/2012   HGB 13.9 10/02/2012   HCT  41.2 10/02/2012   PLT 133.0* 10/02/2012   GLUCOSE 98 08/23/2013   CHOL 137 06/02/2012   TRIG 112.0 06/02/2012   HDL 36.30* 06/02/2012   LDLCALC 78 06/02/2012   ALT 14 06/02/2012   AST 19 06/02/2012   NA  137 08/23/2013   K 4.5 08/23/2013   CL 104 08/23/2013   CREATININE 1.2 08/23/2013   BUN 16 08/23/2013   CO2 30 08/23/2013   TSH 18.60* 06/02/2012   PSA 1.91 11/09/2010        Assessment & Plan:

## 2013-11-27 NOTE — Assessment & Plan Note (Addendum)
12/14 chronic in B LEs Hold Crestor Consider Gbapentin, Neurol ref

## 2013-11-27 NOTE — Progress Notes (Signed)
Pre visit review using our clinic review tool, if applicable. No additional management support is needed unless otherwise documented below in the visit note. 

## 2013-11-27 NOTE — Assessment & Plan Note (Signed)
Continue with current prescription therapy as reflected on the Med list.  

## 2013-11-27 NOTE — Patient Instructions (Signed)
Hold Crestor 

## 2013-11-29 ENCOUNTER — Encounter: Payer: Self-pay | Admitting: Internal Medicine

## 2014-01-29 ENCOUNTER — Encounter: Payer: Self-pay | Admitting: Internal Medicine

## 2014-01-29 ENCOUNTER — Ambulatory Visit (INDEPENDENT_AMBULATORY_CARE_PROVIDER_SITE_OTHER): Payer: Medicare Other | Admitting: Internal Medicine

## 2014-01-29 VITALS — BP 154/90 | HR 64 | Temp 97.6°F | Wt 192.0 lb

## 2014-01-29 DIAGNOSIS — E785 Hyperlipidemia, unspecified: Secondary | ICD-10-CM

## 2014-01-29 DIAGNOSIS — I10 Essential (primary) hypertension: Secondary | ICD-10-CM

## 2014-01-29 DIAGNOSIS — Z87442 Personal history of urinary calculi: Secondary | ICD-10-CM

## 2014-01-29 DIAGNOSIS — G629 Polyneuropathy, unspecified: Secondary | ICD-10-CM

## 2014-01-29 DIAGNOSIS — Z23 Encounter for immunization: Secondary | ICD-10-CM

## 2014-01-29 DIAGNOSIS — F0390 Unspecified dementia without behavioral disturbance: Secondary | ICD-10-CM

## 2014-01-29 DIAGNOSIS — G589 Mononeuropathy, unspecified: Secondary | ICD-10-CM

## 2014-01-29 DIAGNOSIS — K5190 Ulcerative colitis, unspecified, without complications: Secondary | ICD-10-CM

## 2014-01-29 MED ORDER — AMLODIPINE BESYLATE 5 MG PO TABS: 5.0000 mg | ORAL_TABLET | Freq: Every day | ORAL | Status: DC

## 2014-01-29 NOTE — Progress Notes (Signed)
Pre visit review using our clinic review tool, if applicable. No additional management support is needed unless otherwise documented below in the visit note. 

## 2014-01-29 NOTE — Assessment & Plan Note (Signed)
Continue with current prescription therapy as reflected on the Med list.  

## 2014-01-29 NOTE — Progress Notes (Signed)
Patient ID: Chris Stokes, male   DOB: 1929/08/25, 80 y.o.   MRN: [id number]   Subjective:   C/o legs not working well - "disconnected", stumbling, "restless" at night... They burn. No LBP  Urinary Frequency  This is a chronic problem. The current episode started more than 1 year ago. The problem occurs intermittently. The problem has been gradually improving. The patient is experiencing no pain. There has been no fever. He is not sexually active. There is no history of pyelonephritis. Associated symptoms include urgency. Pertinent negatives include no frequency, hematuria or nausea. His past medical history is significant for kidney stones.    F/u gout - better, pulmonary fibrosis The patient presents for a follow-up of  chronic hypertension (BP is up off a diuretic), colitis, chronic dyslipidemia, CAD controlled with medicines.  F/u on tremor - better; cough resolved F/u runny nose when eating... - better Per family - some loss of functioning and memory issues  BP Readings from Last 3 Encounters:  01/29/14 154/90  11/27/13 140/90  08/23/13 130/90   Wt Readings from Last 3 Encounters:  01/29/14 192 lb (87.091 kg)  11/27/13 188 lb (85.276 kg)  08/23/13 198 lb 12.8 oz (90.175 kg)      Review of Systems  Constitutional: Positive for fatigue. Negative for appetite change and unexpected weight change.  HENT: Positive for congestion and rhinorrhea. Negative for nosebleeds, sneezing, sore throat and trouble swallowing.   Eyes: Negative for itching and visual disturbance.  Respiratory: Positive for cough and shortness of breath.   Cardiovascular: Negative for chest pain, palpitations and leg swelling.  Gastrointestinal: Negative for nausea, diarrhea, blood in stool and abdominal distention.  Genitourinary: Positive for urgency. Negative for frequency and hematuria.  Musculoskeletal: Negative for back pain, gait problem, joint swelling and neck pain.  Skin: Negative for rash and wound.   Neurological: Positive for tremors. Negative for dizziness, speech difficulty, weakness, light-headedness and headaches.  Psychiatric/Behavioral: Negative for suicidal ideas, sleep disturbance, dysphoric mood and agitation. The patient is nervous/anxious.        Objective:   Physical Exam  Constitutional: He is oriented to person, place, and time. He appears well-developed. No distress.  NAD A little dyspneic  HENT:  Mouth/Throat: Oropharynx is clear and moist.  Eyes: Conjunctivae are normal. Pupils are equal, round, and reactive to light.  Neck: Normal range of motion. No JVD present. No thyromegaly present.  Cardiovascular: Normal rate, regular rhythm, normal heart sounds and intact distal pulses.  Exam reveals no gallop and no friction rub.   No murmur heard. Pulmonary/Chest: Effort normal and breath sounds normal. No respiratory distress. He has no wheezes. He has no rales. He exhibits no tenderness.  Abdominal: Soft. Bowel sounds are normal. He exhibits no distension and no mass. There is no tenderness. There is no rebound and no guarding.  Genitourinary: Rectum normal and penis normal. Guaiac negative stool. No penile tenderness.  1+ prostate  Musculoskeletal: Normal range of motion. He exhibits no edema and no tenderness.  Lymphadenopathy:    He has no cervical adenopathy.  Neurological: He is alert and oriented to person, place, and time. He has normal reflexes. No cranial nerve deficit. He exhibits normal muscle tone. Coordination abnormal.  Skin: Skin is warm and dry. No rash noted. No erythema.  Psychiatric: He has a normal mood and affect. His behavior is normal. Judgment and thought content normal.  Intentional tremor, ataxia - better Recalls 1/3 in 5 min Serial 7s w/one error  Lab Results  Component Value Date   WBC 8.3 10/02/2012   HGB 13.9 10/02/2012   HCT 41.2 10/02/2012   PLT 133.0* 10/02/2012   GLUCOSE 98 08/23/2013   CHOL 137 06/02/2012   TRIG 112.0 06/02/2012    HDL 36.30* 06/02/2012   LDLCALC 78 06/02/2012   ALT 14 06/02/2012   AST 19 06/02/2012   NA 137 08/23/2013   K 4.5 08/23/2013   CL 104 08/23/2013   CREATININE 1.2 08/23/2013   BUN 16 08/23/2013   CO2 30 08/23/2013   TSH 18.60* 06/02/2012   PSA 1.91 11/09/2010        Assessment & Plan:

## 2014-01-29 NOTE — Assessment & Plan Note (Signed)
Off Aldactone - worse BP Start Norvasc

## 2014-01-29 NOTE — Assessment & Plan Note (Signed)
Gabapentin helped Cont Rx

## 2014-01-29 NOTE — Assessment & Plan Note (Signed)
No relapse 

## 2014-03-04 ENCOUNTER — Encounter: Payer: Self-pay | Admitting: Internal Medicine

## 2014-03-04 ENCOUNTER — Ambulatory Visit (INDEPENDENT_AMBULATORY_CARE_PROVIDER_SITE_OTHER): Payer: Medicare Other | Admitting: Internal Medicine

## 2014-03-04 VITALS — BP 160/90 | HR 64 | Temp 97.0°F | Resp 16 | Wt 187.0 lb

## 2014-03-04 DIAGNOSIS — R05 Cough: Secondary | ICD-10-CM

## 2014-03-04 DIAGNOSIS — R059 Cough, unspecified: Secondary | ICD-10-CM

## 2014-03-04 MED ORDER — AZITHROMYCIN 250 MG PO TABS: ORAL_TABLET | ORAL | Status: DC

## 2014-03-04 MED ORDER — PROMETHAZINE-CODEINE 6.25-10 MG/5ML PO SYRP: 5.0000 mL | ORAL_SOLUTION | ORAL | Status: DC | PRN

## 2014-03-04 NOTE — Progress Notes (Signed)
Subjective:     URI  This is a new problem. The current episode started in the past 7 days. The problem has been gradually worsening. The maximum temperature recorded prior to his arrival was 100 - 100.9 F. Associated symptoms include congestion, coughing, rhinorrhea, sinus pain and a sore throat. Pertinent negatives include no chest pain, diarrhea, headaches, neck pain, rash, sneezing or wheezing. He has tried acetaminophen for the symptoms. The treatment provided no relief.    F/u gout - better, pulmonary fibrosis The patient presents for a follow-up of  chronic hypertension (BP is up off a diuretic), colitis, chronic dyslipidemia, CAD controlled with medicines.  F/u on tremor - better; cough resolved F/u runny nose when eating... - better Per family - some loss of functioning and memory issues  BP Readings from Last 3 Encounters:  03/04/14 160/90  01/29/14 154/90  11/27/13 140/90   Wt Readings from Last 3 Encounters:  03/04/14 187 lb (84.823 kg)  01/29/14 192 lb (87.091 kg)  11/27/13 188 lb (85.276 kg)      Review of Systems  Constitutional: Positive for fatigue. Negative for appetite change and unexpected weight change.  HENT: Positive for congestion, rhinorrhea and sore throat. Negative for nosebleeds, sneezing and trouble swallowing.   Eyes: Negative for itching and visual disturbance.  Respiratory: Positive for cough and shortness of breath. Negative for wheezing.   Cardiovascular: Negative for chest pain, palpitations and leg swelling.  Gastrointestinal: Negative for diarrhea, blood in stool and abdominal distention.  Musculoskeletal: Negative for back pain, gait problem, joint swelling and neck pain.  Skin: Negative for rash and wound.  Neurological: Positive for tremors. Negative for dizziness, speech difficulty, weakness, light-headedness and headaches.  Psychiatric/Behavioral: Negative for suicidal ideas, sleep disturbance, dysphoric mood and agitation. The  patient is nervous/anxious.        Objective:   Physical Exam  Constitutional: He is oriented to person, place, and time. He appears well-developed. No distress.  NAD A little dyspneic  HENT:  eryth throat  Eyes: Conjunctivae are normal. Pupils are equal, round, and reactive to light.  Neck: Normal range of motion. No JVD present. No thyromegaly present.  Cardiovascular: Normal rate, regular rhythm, normal heart sounds and intact distal pulses.  Exam reveals no gallop and no friction rub.   No murmur heard. Pulmonary/Chest: Effort normal and breath sounds normal. No respiratory distress. He has no wheezes. He has no rales. He exhibits no tenderness.  Abdominal: Soft. Bowel sounds are normal. He exhibits no distension and no mass. There is no tenderness. There is no rebound and no guarding.  Genitourinary: Rectum normal and penis normal. Guaiac negative stool. No penile tenderness.  1+ prostate  Musculoskeletal: Normal range of motion. He exhibits no edema and no tenderness.  Lymphadenopathy:    He has no cervical adenopathy.  Neurological: He is alert and oriented to person, place, and time. He has normal reflexes. No cranial nerve deficit. He exhibits normal muscle tone. Coordination abnormal.  Skin: Skin is warm and dry. No rash noted. No erythema.  Psychiatric: He has a normal mood and affect. His behavior is normal. Judgment and thought content normal.  Intentional tremor, ataxia - better Recalls 1/3 in 5 min Serial 7s w/one error     Lab Results  Component Value Date   WBC 8.3 10/02/2012   HGB 13.9 10/02/2012   HCT 41.2 10/02/2012   PLT 133.0* 10/02/2012   GLUCOSE 98 08/23/2013   CHOL 137 06/02/2012   TRIG 112.0  06/02/2012   HDL 36.30* 06/02/2012   LDLCALC 78 06/02/2012   ALT 14 06/02/2012   AST 19 06/02/2012   NA 137 08/23/2013   K 4.5 08/23/2013   CL 104 08/23/2013   CREATININE 1.2 08/23/2013   BUN 16 08/23/2013   CO2 30 08/23/2013   TSH 18.60* 06/02/2012   PSA 1.91 11/09/2010         Assessment & Plan:

## 2014-03-04 NOTE — Progress Notes (Signed)
Pre visit review using our clinic review tool, if applicable. No additional management support is needed unless otherwise documented below in the visit note. 

## 2014-03-04 NOTE — Assessment & Plan Note (Signed)
URI Zpac Prom-cod

## 2014-03-29 ENCOUNTER — Other Ambulatory Visit: Payer: Self-pay | Admitting: *Deleted

## 2014-03-29 DIAGNOSIS — M109 Gout, unspecified: Secondary | ICD-10-CM

## 2014-03-29 MED ORDER — PRIMIDONE 50 MG PO TABS: 50.0000 mg | ORAL_TABLET | Freq: Three times a day (TID) | ORAL | Status: DC

## 2014-03-29 MED ORDER — ALLOPURINOL 100 MG PO TABS: 100.0000 mg | ORAL_TABLET | Freq: Every day | ORAL | Status: DC

## 2014-04-01 ENCOUNTER — Other Ambulatory Visit: Payer: Self-pay | Admitting: Internal Medicine

## 2014-04-03 ENCOUNTER — Other Ambulatory Visit: Payer: Self-pay | Admitting: *Deleted

## 2014-04-03 MED ORDER — DONEPEZIL HCL 10 MG PO TABS: 10.0000 mg | ORAL_TABLET | Freq: Every day | ORAL | Status: DC

## 2014-04-26 ENCOUNTER — Telehealth: Payer: Self-pay

## 2014-04-26 NOTE — Telephone Encounter (Signed)
Erroneous encounter

## 2014-04-29 ENCOUNTER — Telehealth: Payer: Self-pay | Admitting: Internal Medicine

## 2014-04-29 NOTE — Telephone Encounter (Signed)
Faxed refill of Delzicol 400mg  to [email or web address]  Put samples up front.  Called patient , no answer, no answering machine

## 2014-05-01 ENCOUNTER — Other Ambulatory Visit: Payer: Self-pay | Admitting: Internal Medicine

## 2014-05-13 ENCOUNTER — Other Ambulatory Visit: Payer: Self-pay | Admitting: *Deleted

## 2014-05-13 MED ORDER — LOSARTAN POTASSIUM 100 MG PO TABS: 100.0000 mg | ORAL_TABLET | Freq: Every day | ORAL | Status: DC

## 2014-05-21 ENCOUNTER — Encounter: Payer: Self-pay | Admitting: Internal Medicine

## 2014-05-21 ENCOUNTER — Ambulatory Visit (INDEPENDENT_AMBULATORY_CARE_PROVIDER_SITE_OTHER): Payer: Medicare Other | Admitting: Internal Medicine

## 2014-05-21 VITALS — BP 130/78 | HR 72 | Temp 98.3°F | Resp 16 | Wt 189.0 lb

## 2014-05-21 DIAGNOSIS — G589 Mononeuropathy, unspecified: Secondary | ICD-10-CM

## 2014-05-21 DIAGNOSIS — E039 Hypothyroidism, unspecified: Secondary | ICD-10-CM

## 2014-05-21 DIAGNOSIS — G252 Other specified forms of tremor: Secondary | ICD-10-CM

## 2014-05-21 DIAGNOSIS — G629 Polyneuropathy, unspecified: Secondary | ICD-10-CM

## 2014-05-21 DIAGNOSIS — F0390 Unspecified dementia without behavioral disturbance: Secondary | ICD-10-CM

## 2014-05-21 DIAGNOSIS — G250 Essential tremor: Secondary | ICD-10-CM

## 2014-05-21 MED ORDER — GABAPENTIN 300 MG PO CAPS: 300.0000 mg | ORAL_CAPSULE | Freq: Three times a day (TID) | ORAL | Status: DC | PRN

## 2014-05-21 NOTE — Assessment & Plan Note (Signed)
Continue with current prescription therapy as reflected on the Med list.  

## 2014-05-21 NOTE — Progress Notes (Signed)
Pre visit review using our clinic review tool, if applicable. No additional management support is needed unless otherwise documented below in the visit note. 

## 2014-05-21 NOTE — Progress Notes (Signed)
Subjective:   F/u legs not working well - "disconnected", stumbling, "restless" at night... They burn. No LBP Cough has stopped  Urinary Frequency  This is a chronic problem. The current episode started more than 1 year ago. The problem occurs intermittently. The problem has been gradually improving. The patient is experiencing no pain. There has been no fever. He is not sexually active. There is no history of pyelonephritis. Associated symptoms include urgency. Pertinent negatives include no frequency, hematuria or nausea. His past medical history is significant for kidney stones.    F/u gout - better, pulmonary fibrosis The patient presents for a follow-up of  chronic hypertension (BP is up off a diuretic), colitis, chronic dyslipidemia, CAD controlled with medicines.  F/u on tremor - better; cough resolved F/u runny nose when eating... - better Per family - some loss of functioning and memory issues  BP Readings from Last 3 Encounters:  05/21/14 130/78  03/04/14 160/90  01/29/14 154/90   Wt Readings from Last 3 Encounters:  05/21/14 189 lb (85.73 kg)  03/04/14 187 lb (84.823 kg)  01/29/14 192 lb (87.091 kg)      Review of Systems  Constitutional: Positive for fatigue. Negative for appetite change and unexpected weight change.  HENT: Positive for congestion and rhinorrhea. Negative for nosebleeds, sneezing, sore throat and trouble swallowing.   Eyes: Negative for itching and visual disturbance.  Respiratory: Positive for cough and shortness of breath.   Cardiovascular: Negative for chest pain, palpitations and leg swelling.  Gastrointestinal: Negative for nausea, diarrhea, blood in stool and abdominal distention.  Genitourinary: Positive for urgency. Negative for frequency and hematuria.  Musculoskeletal: Negative for back pain, gait problem, joint swelling and neck pain.  Skin: Negative for rash and wound.  Neurological: Positive for tremors. Negative for dizziness,  speech difficulty, weakness, light-headedness and headaches.  Psychiatric/Behavioral: Negative for suicidal ideas, sleep disturbance, dysphoric mood and agitation. The patient is nervous/anxious.        Objective:   Physical Exam  Constitutional: He is oriented to person, place, and time. He appears well-developed. No distress.  NAD A little dyspneic  HENT:  Mouth/Throat: Oropharynx is clear and moist.  Eyes: Conjunctivae are normal. Pupils are equal, round, and reactive to light.  Neck: Normal range of motion. No JVD present. No thyromegaly present.  Cardiovascular: Normal rate, regular rhythm, normal heart sounds and intact distal pulses.  Exam reveals no gallop and no friction rub.   No murmur heard. Pulmonary/Chest: Effort normal and breath sounds normal. No respiratory distress. He has no wheezes. He has no rales. He exhibits no tenderness.  Abdominal: Soft. Bowel sounds are normal. He exhibits no distension and no mass. There is no tenderness. There is no rebound and no guarding.  Genitourinary: Rectum normal and penis normal. Guaiac negative stool. No penile tenderness.  1+ prostate  Musculoskeletal: Normal range of motion. He exhibits no edema and no tenderness.  Lymphadenopathy:    He has no cervical adenopathy.  Neurological: He is alert and oriented to person, place, and time. He has normal reflexes. No cranial nerve deficit. He exhibits normal muscle tone. Coordination abnormal.  Skin: Skin is warm and dry. No rash noted. No erythema.  Psychiatric: He has a normal mood and affect. His behavior is normal. Judgment and thought content normal.  Intentional tremor, ataxia - better Recalls 1/3 in 5 min Serial 7s w/one error     Lab Results  Component Value Date   WBC 8.3 10/02/2012   HGB  13.9 10/02/2012   HCT 41.2 10/02/2012   PLT 133.0* 10/02/2012   GLUCOSE 98 08/23/2013   CHOL 137 06/02/2012   TRIG 112.0 06/02/2012   HDL 36.30* 06/02/2012   LDLCALC 78 06/02/2012   ALT 14  06/02/2012   AST 19 06/02/2012   NA 137 08/23/2013   K 4.5 08/23/2013   CL 104 08/23/2013   CREATININE 1.2 08/23/2013   BUN 16 08/23/2013   CO2 30 08/23/2013   TSH 18.60* 06/02/2012   PSA 1.91 11/09/2010        Assessment & Plan:

## 2014-05-21 NOTE — Assessment & Plan Note (Signed)
Will increase Gabapentin w/caution

## 2014-06-03 ENCOUNTER — Encounter: Payer: Self-pay | Admitting: Pulmonary Disease

## 2014-06-03 ENCOUNTER — Ambulatory Visit (INDEPENDENT_AMBULATORY_CARE_PROVIDER_SITE_OTHER): Payer: Medicare Other | Admitting: Pulmonary Disease

## 2014-06-03 ENCOUNTER — Ambulatory Visit (INDEPENDENT_AMBULATORY_CARE_PROVIDER_SITE_OTHER)
Admission: RE | Admit: 2014-06-03 | Discharge: 2014-06-03 | Disposition: A | Discharge status: Home / Self Care | Payer: Medicare Other | Source: Ambulatory Visit | Attending: Pulmonary Disease | Admitting: Pulmonary Disease

## 2014-06-03 VITALS — BP 130/80 | HR 50 | Temp 96.8°F | Ht 71.0 in | Wt 193.2 lb

## 2014-06-03 DIAGNOSIS — J8410 Pulmonary fibrosis, unspecified: Secondary | ICD-10-CM

## 2014-06-03 DIAGNOSIS — J84112 Idiopathic pulmonary fibrosis: Secondary | ICD-10-CM

## 2014-06-03 DIAGNOSIS — J849 Interstitial pulmonary disease, unspecified: Secondary | ICD-10-CM

## 2014-06-03 NOTE — Patient Instructions (Signed)
Will check cxr today, and call you with results. Will schedule for breathing tests in the near future, and call you with results. Stay as active as possible, and call if you feel your breathing is getting worse. followup with me again in 8mos.

## 2014-06-03 NOTE — Progress Notes (Signed)
   Subjective:    Patient ID: Chris Stokes, male    DOB: 03-05-1929, 80 y.o.   MRN: [id number]  HPI The patient comes in today for followup of his known idiopathic pulmonary fibrosis. We have been following serial PFTs and chest x-rays, with stability noted. He is also had stable exertional tolerance with no increased cough or other breathing issues. He continues to maintain a stable baseline, and is remaining quite functional. He is not requiring supplemental oxygen.   Review of Systems  Constitutional: Negative for fever and unexpected weight change.  HENT: Positive for postnasal drip. Negative for congestion, dental problem, ear pain, nosebleeds, rhinorrhea, sinus pressure, sneezing, sore throat and trouble swallowing.   Eyes: Negative for redness and itching.  Respiratory: Positive for cough and shortness of breath. Negative for chest tightness and wheezing.   Cardiovascular: Negative for palpitations and leg swelling.  Gastrointestinal: Negative for nausea and vomiting.  Genitourinary: Negative for dysuria.  Musculoskeletal: Negative for joint swelling.  Skin: Negative for rash.  Neurological: Negative for headaches.  Hematological: Does not bruise/bleed easily.  Psychiatric/Behavioral: Negative for dysphoric mood. The patient is not nervous/anxious.        Objective:   Physical Exam Well-developed male in no acute distress Nose without purulence or discharge noted Neck without lymphadenopathy or thyromegaly Chest with crackles one half the way up bilaterally, no wheezing Cardiac exam with regular rate and rhythm Lower extremities with mild edema, no cyanosis Alert and oriented, moves all 4 extremities.       Assessment & Plan:

## 2014-06-03 NOTE — Addendum Note (Signed)
Addended by: [place] Bing R on: 06/03/2014 11:00 AM   Modules accepted: Orders

## 2014-06-03 NOTE — Assessment & Plan Note (Signed)
The patient appears to be at a stable baseline with respect to his pulmonary symptoms and exertional tolerance. He has not seen a decline since the last visit. He denies any cough or mucus production from his chest, but does have postnasal drip. He is due for his followup chest x-ray and pulmonary function studies to reassess the extent of his underlying lung disease. We are holding anti-fibrotic agents at this time because of his stability and age. I will call him with the results of his studies, and see him back in 6 months.

## 2014-06-10 ENCOUNTER — Telehealth: Payer: Self-pay | Admitting: Internal Medicine

## 2014-06-10 NOTE — Telephone Encounter (Signed)
Pt states he is having a colitis flare with some bright red rectal bleeding. Pt requesting to be see asap. Pt scheduled to see Dr. Henrene Pastor 06/11/14@10 :15am. Pt aware of appt.

## 2014-06-11 ENCOUNTER — Other Ambulatory Visit: Payer: Self-pay

## 2014-06-11 ENCOUNTER — Encounter: Payer: Self-pay | Admitting: Internal Medicine

## 2014-06-11 ENCOUNTER — Ambulatory Visit (INDEPENDENT_AMBULATORY_CARE_PROVIDER_SITE_OTHER): Payer: Medicare Other | Admitting: Internal Medicine

## 2014-06-11 VITALS — BP 120/64 | HR 58 | Ht 71.0 in | Wt 194.0 lb

## 2014-06-11 DIAGNOSIS — K5190 Ulcerative colitis, unspecified, without complications: Secondary | ICD-10-CM

## 2014-06-11 DIAGNOSIS — K219 Gastro-esophageal reflux disease without esophagitis: Secondary | ICD-10-CM

## 2014-06-11 DIAGNOSIS — R197 Diarrhea, unspecified: Secondary | ICD-10-CM

## 2014-06-11 DIAGNOSIS — I119 Hypertensive heart disease without heart failure: Secondary | ICD-10-CM

## 2014-06-11 MED ORDER — CARVEDILOL 25 MG PO TABS: 12.5000 mg | ORAL_TABLET | Freq: Two times a day (BID) | ORAL | Status: DC

## 2014-06-11 MED ORDER — PREDNISONE 20 MG PO TABS: ORAL_TABLET | ORAL | Status: DC

## 2014-06-11 MED ORDER — LEVOTHYROXINE SODIUM 137 MCG PO TABS: 137.0000 ug | ORAL_TABLET | Freq: Every day | ORAL | Status: DC

## 2014-06-11 NOTE — Progress Notes (Signed)
HISTORY OF PRESENT ILLNESS:  Chris Stokes is a 80 y.o. male with a history of coronary artery disease, hyperlipidemia, hypothyroidism, hypertension, and left-sided ulcerative colitis. He also has a history of GERD and adenomatous colon polyps. He was last seen in 12/22/2012 when he underwent surveillance colonoscopy. Examination was normal. He has continued on mesalamine 4.8 g daily. He normally has 3-4 formed bowel movements per day. However, over the past month he reports increased frequency of stools with mucus and less form. Occasional blood. Typical flare for him. No antibiotics this year. No fevers. He denies change in appetite, weight loss, or fever. No nocturnal symptoms. He has had urgency. Previous flares have been treated with a course of prednisone with good results. GI review of systems is otherwise negative  REVIEW OF SYSTEMS:  All non-GI ROS negative except for urinary frequency and urinary leakage  Past Medical History  Diagnosis Date  . CAD (coronary artery disease)     Dr. Mare Ferrari  . Hyperlipidemia   . Hypertension   . Hypothyroidism   . Ulcerative colitis     Dr Henrene Pastor  . Hiatal hernia   . History of heart attack   . Pulmonary fibrosis     new dx-couple weeks/ no O2  . Seasonal allergies   . Cataract 1999    bilateral cataracts   . Pulmonary fibrosis 12/08/12  . GERD (gastroesophageal reflux disease) 2012  . Chronic kidney disease     multiple kidney stone flare ups  . Myocardial infarction 1984  . Rectal fissure   . Colon polyps     adenomatous    Past Surgical History  Procedure Laterality Date  . Abdominal aortic aneurysm repair  1992  . Coronary artery bypass graft  2004    4 vessel bypass  . Colonoscopy    . Polypectomy      Social History Chris Stokes  reports that he quit smoking about 47 years ago. His smoking use included Cigarettes. He has a 50 pack-year smoking history. He has never used smokeless tobacco. He reports that he does not drink alcohol  or use illicit drugs.  family history includes Arthritis/Rheumatoid in his mother; Cancer in his cousin; Colon cancer in his cousin; Emphysema in his brother and father; Hyperlipidemia in his brother, father, and sister; Hypertension in his father and other; Lymphoma in his sister.  Allergies  Allergen Reactions  . Lipitor [Atorvastatin]     cramps  . Ramipril     cough  . Simvastatin     REACTION: myalgia  . Sulfonamide Derivatives     REACTION: rash       PHYSICAL EXAMINATION: Vital signs: BP 120/64  Pulse 58  Ht 5\' 11"  (1.803 m)  Wt 194 lb (87.998 kg)  BMI 27.07 kg/m2 General: Well-developed, well-nourished, no acute distress HEENT: Sclerae are anicteric, conjunctiva pink. Oral mucosa intact Lungs: Clear Heart: Regular Abdomen: soft, nontender, nondistended, no obvious ascites, no peritoneal signs, normal bowel sounds. No organomegaly. Midline incision well-healed with wound diastases Extremities: No edema Psychiatric: alert and oriented x3. Cooperative   ASSESSMENT:  #1. Left-sided ulcerative colitis. Flare (mild to moderate) #2. Last colonoscopy January 2014. Normal #3. GERD. Asymptomatic on PPI #4. History of adenomatous colon polyps.   PLAN:  #1. Continue mesalamine 4.8 g daily #2. Prescribe prednisone taper. 40 mg for 2 weeks, then 30 mg for 2 weeks, then 20 mg for 2 weeks, then 10 mg for 2 weeks. Then stop. #3. Office followup in 2 months. Contact  the office in the interim for persistent or worsening symptoms #4. Continue PPI #5. Ongoing general medical care with Dr. Alain Marion

## 2014-06-11 NOTE — Patient Instructions (Addendum)
You have a follow up visit scheduled with Dr. Henrene Pastor on 08-29-2014 at 39 am.  We have sent the following medications to your pharmacy for you to pick up at your convenience:  Prednisone.  Take as directed

## 2014-07-02 ENCOUNTER — Ambulatory Visit (INDEPENDENT_AMBULATORY_CARE_PROVIDER_SITE_OTHER): Payer: Medicare Other | Admitting: Pulmonary Disease

## 2014-07-02 DIAGNOSIS — J849 Interstitial pulmonary disease, unspecified: Secondary | ICD-10-CM

## 2014-07-02 DIAGNOSIS — J8410 Pulmonary fibrosis, unspecified: Secondary | ICD-10-CM

## 2014-07-02 LAB — PULMONARY FUNCTION TEST
DL/VA % PRED: 50 %
DL/VA: 2.31 ml/min/mmHg/L
DLCO unc % pred: 32 %
DLCO unc: 10.5 ml/min/mmHg
FEF 25-75 Post: 1.94 L/sec
FEF 25-75 Pre: 1.98 L/sec
FEF2575-%Change-Post: -2 %
FEF2575-%PRED-POST: 106 %
FEF2575-%PRED-PRE: 108 %
FEV1-%Change-Post: 0 %
FEV1-%PRED-POST: 88 %
FEV1-%PRED-PRE: 89 %
FEV1-PRE: 2.46 L
FEV1-Post: 2.44 L
FEV1FVC-%CHANGE-POST: 1 %
FEV1FVC-%PRED-PRE: 108 %
FEV6-%CHANGE-POST: -2 %
FEV6-%Pred-Post: 85 %
FEV6-%Pred-Pre: 87 %
FEV6-Post: 3.1 L
FEV6-Pre: 3.17 L
FEV6FVC-%Change-Post: 0 %
FEV6FVC-%PRED-POST: 106 %
FEV6FVC-%Pred-Pre: 106 %
FVC-%CHANGE-POST: -1 %
FVC-%PRED-POST: 80 %
FVC-%Pred-Pre: 81 %
FVC-PRE: 3.2 L
FVC-Post: 3.14 L
POST FEV1/FVC RATIO: 78 %
PRE FEV6/FVC RATIO: 99 %
Post FEV6/FVC ratio: 99 %
Pre FEV1/FVC ratio: 77 %
RV % PRED: 42 %
RV: 1.16 L
TLC % pred: 70 %
TLC: 5.02 L

## 2014-07-02 NOTE — Progress Notes (Signed)
PFT done today. 

## 2014-07-05 ENCOUNTER — Telehealth: Payer: Self-pay | Admitting: Pulmonary Disease

## 2014-07-05 NOTE — Telephone Encounter (Signed)
Called and spoke with pt and he is aware of PFT results per [facility].  Pt voiced his understanding and nothing further is needed.

## 2014-07-23 ENCOUNTER — Ambulatory Visit (INDEPENDENT_AMBULATORY_CARE_PROVIDER_SITE_OTHER): Payer: Medicare Other | Admitting: Internal Medicine

## 2014-07-23 ENCOUNTER — Encounter: Payer: Self-pay | Admitting: Internal Medicine

## 2014-07-23 ENCOUNTER — Inpatient Hospital Stay
Admission: RE | Admit: 2014-07-23 | Discharge: 2014-07-23 | Disposition: A | Discharge status: Home / Self Care | Payer: Self-pay | Source: Ambulatory Visit | Attending: Internal Medicine | Admitting: Internal Medicine

## 2014-07-23 ENCOUNTER — Other Ambulatory Visit: Payer: Self-pay | Admitting: *Deleted

## 2014-07-23 ENCOUNTER — Ambulatory Visit (INDEPENDENT_AMBULATORY_CARE_PROVIDER_SITE_OTHER)
Admission: RE | Admit: 2014-07-23 | Discharge: 2014-07-23 | Disposition: A | Discharge status: Home / Self Care | Payer: Medicare Other | Source: Ambulatory Visit | Attending: Family Medicine | Admitting: Family Medicine

## 2014-07-23 VITALS — BP 140/72 | HR 55 | Temp 97.7°F | Wt 197.0 lb

## 2014-07-23 DIAGNOSIS — R32 Unspecified urinary incontinence: Secondary | ICD-10-CM | POA: Insufficient documentation

## 2014-07-23 DIAGNOSIS — M25551 Pain in right hip: Secondary | ICD-10-CM | POA: Insufficient documentation

## 2014-07-23 DIAGNOSIS — N3946 Mixed incontinence: Secondary | ICD-10-CM

## 2014-07-23 DIAGNOSIS — M25559 Pain in unspecified hip: Secondary | ICD-10-CM

## 2014-07-23 DIAGNOSIS — K5190 Ulcerative colitis, unspecified, without complications: Secondary | ICD-10-CM

## 2014-07-23 DIAGNOSIS — N320 Bladder-neck obstruction: Secondary | ICD-10-CM

## 2014-07-23 MED ORDER — TRAMADOL HCL 50 MG PO TABS: 50.0000 mg | ORAL_TABLET | Freq: Two times a day (BID) | ORAL | Status: DC | PRN

## 2014-07-23 MED ORDER — MIRABEGRON ER 25 MG PO TB24: 25.0000 mg | ORAL_TABLET | Freq: Every day | ORAL | Status: DC

## 2014-07-23 NOTE — Assessment & Plan Note (Addendum)
8/15 x4 weeks - started during Prednisone Rx for his ulcerative colitis flare-up. R/o hip AVN Take Prednisone 5 mg/d x 3 d, then stop Hip Xray. May need an MRI Pain meds Sports med ref   Potential benefits of a short or long term Tramadol/opioids use as well as potential risks (i.e. addiction risk, apnea etc) and complications (i.e. Somnolence, constipation and others) were explained to the patient and were aknowledged.

## 2014-07-23 NOTE — Progress Notes (Signed)
Subjective:   F/u legs not working well - "disconnected", stumbling, "restless" at night... They burn. No LBP   Hip Pain  The incident occurred 3 to 6 hours ago. There was no injury mechanism. The pain is present in the right thigh and right hip. The quality of the pain is described as aching and stabbing. The pain is at a severity of 8/10. The pain is severe. The pain has been constant since onset. Associated symptoms include an inability to bear weight. He has tried ice for the symptoms. The treatment provided no relief.  He has been on Prednisone for colitis flare-up lately...  C/o peeing too much - incontinent x months. Flomax is not helping. Urine looks clear.  F/u gout - better, pulmonary fibrosis The patient presents for a follow-up of  chronic hypertension (BP is up off a diuretic), colitis, chronic dyslipidemia, CAD controlled with medicines.  F/u on tremor - better; cough resolved F/u runny nose when eating... - better Per family - some loss of functioning and memory issues  BP Readings from Last 3 Encounters:  07/23/14 140/72  06/11/14 120/64  06/03/14 130/80   Wt Readings from Last 3 Encounters:  07/23/14 197 lb (89.359 kg)  06/11/14 194 lb (87.998 kg)  06/03/14 193 lb 3.2 oz (87.635 kg)      Review of Systems  Constitutional: Positive for fatigue. Negative for appetite change and unexpected weight change.  HENT: Positive for congestion and rhinorrhea. Negative for nosebleeds, sneezing, sore throat and trouble swallowing.   Eyes: Negative for itching and visual disturbance.  Respiratory: Positive for cough and shortness of breath.   Cardiovascular: Negative for chest pain, palpitations and leg swelling.  Gastrointestinal: Negative for diarrhea, blood in stool and abdominal distention.  Musculoskeletal: Negative for back pain, gait problem, joint swelling and neck pain.  Skin: Negative for rash and wound.  Neurological: Positive for tremors. Negative for  dizziness, speech difficulty, weakness, light-headedness and headaches.  Psychiatric/Behavioral: Negative for suicidal ideas, sleep disturbance, dysphoric mood and agitation. The patient is nervous/anxious.        Objective:   Physical Exam  Constitutional: He is oriented to person, place, and time. He appears well-developed. No distress.  NAD A little dyspneic  HENT:  Mouth/Throat: Oropharynx is clear and moist.  Eyes: Conjunctivae are normal. Pupils are equal, round, and reactive to light.  Neck: Normal range of motion. No JVD present. No thyromegaly present.  Cardiovascular: Normal rate, regular rhythm, normal heart sounds and intact distal pulses.  Exam reveals no gallop and no friction rub.   No murmur heard. Pulmonary/Chest: Effort normal and breath sounds normal. No respiratory distress. He has no wheezes. He has no rales. He exhibits no tenderness.  Abdominal: Soft. Bowel sounds are normal. He exhibits no distension and no mass. There is no tenderness. There is no rebound and no guarding.  Genitourinary: Guaiac negative stool. No penile tenderness.  Musculoskeletal: Normal range of motion. He exhibits no edema and no tenderness.  Lymphadenopathy:    He has no cervical adenopathy.  Neurological: He is alert and oriented to person, place, and time. He has normal reflexes. No cranial nerve deficit. He exhibits normal muscle tone. Coordination abnormal.  Skin: Skin is warm and dry. No rash noted. No erythema.  Psychiatric: He has a normal mood and affect. His behavior is normal. Judgment and thought content normal.  Intentional tremor, ataxia - better Recalls 1/3 in 5 min Serial 7s w/one error R hip is tender with ROM;  lateral prox femur is tender Str leg elev is neg B     Lab Results  Component Value Date   WBC 8.3 10/02/2012   HGB 13.9 10/02/2012   HCT 41.2 10/02/2012   PLT 133.0* 10/02/2012   GLUCOSE 98 08/23/2013   CHOL 137 06/02/2012   TRIG 112.0 06/02/2012   HDL 36.30*  06/02/2012   LDLCALC 78 06/02/2012   ALT 14 06/02/2012   AST 19 06/02/2012   NA 137 08/23/2013   K 4.5 08/23/2013   CL 104 08/23/2013   CREATININE 1.2 08/23/2013   BUN 16 08/23/2013   CO2 30 08/23/2013   TSH 18.60* 06/02/2012   PSA 1.91 11/09/2010        Assessment & Plan:

## 2014-07-23 NOTE — Patient Instructions (Signed)
Take Prednisone 5 mg/d x 3 d, then stop

## 2014-07-23 NOTE — Progress Notes (Signed)
Pre visit review using our clinic review tool, if applicable. No additional management support is needed unless otherwise documented below in the visit note. 

## 2014-07-23 NOTE — Assessment & Plan Note (Signed)
Taper off Prednisone

## 2014-07-23 NOTE — Assessment & Plan Note (Signed)
Resolved - he has an opposite problem now D/c Flomax

## 2014-07-30 ENCOUNTER — Encounter: Payer: Self-pay | Admitting: Family Medicine

## 2014-07-30 ENCOUNTER — Ambulatory Visit (INDEPENDENT_AMBULATORY_CARE_PROVIDER_SITE_OTHER): Payer: Medicare Other | Admitting: Family Medicine

## 2014-07-30 ENCOUNTER — Other Ambulatory Visit (INDEPENDENT_AMBULATORY_CARE_PROVIDER_SITE_OTHER): Payer: Medicare Other

## 2014-07-30 ENCOUNTER — Ambulatory Visit (INDEPENDENT_AMBULATORY_CARE_PROVIDER_SITE_OTHER)
Admission: RE | Admit: 2014-07-30 | Discharge: 2014-07-30 | Disposition: A | Discharge status: Home / Self Care | Payer: Medicare Other | Source: Ambulatory Visit | Attending: Family Medicine | Admitting: Family Medicine

## 2014-07-30 VITALS — BP 136/82 | HR 69 | Ht 70.0 in | Wt 192.0 lb

## 2014-07-30 DIAGNOSIS — M25559 Pain in unspecified hip: Secondary | ICD-10-CM

## 2014-07-30 DIAGNOSIS — M545 Low back pain, unspecified: Secondary | ICD-10-CM | POA: Insufficient documentation

## 2014-07-30 DIAGNOSIS — M25551 Pain in right hip: Secondary | ICD-10-CM

## 2014-07-30 DIAGNOSIS — M5430 Sciatica, unspecified side: Secondary | ICD-10-CM

## 2014-07-30 DIAGNOSIS — M5441 Lumbago with sciatica, right side: Secondary | ICD-10-CM

## 2014-07-30 NOTE — Assessment & Plan Note (Signed)
She does have what appears to be more of intermittent sciatica based on outpatient describes the presentation. Based on patient symptomatology there is a possibility for an L5 nerve root impingement. X-rays were ordered today. Patient will try conservative therapy with over-the-counter medications, icing protocol, as well as home exercise. Patient continues to have difficulty we may need to consider further imaging versus possibly formal physical therapy. Patient has had long-term prednisone this will rule out compression fracture with the x-ray today. Patient is already on gabapentin 300 mg 3 times daily. Once again patient will come back in 3 weeks for further evaluation and treatment.

## 2014-07-30 NOTE — Patient Instructions (Signed)
Very nice to meet you Ice 20 minutes 2 times daily. Usually after activity and before bed. Exercises 3 times a week. Alternate between the back and the hip.  xrays of your back.  Take tylenol 650 mg three times a day is the best evidence based medicine we have for arthritis.  Glucosamine sulfate 750mg  twice a day is a supplement that has been shown to help moderate to severe arthritis. Vitamin D 2000 IU daily Fish oil 2 grams daily.  Tumeric 500mg  twice daily. Watch for stomach discomfort.  Capsaicin topically up to four times a day may also help with pain. Cortisone injections are an option if these interventions do not seem to make a difference or need more relief.  Come back and see me in 2-3 weeks. I will do injection on side of hip if not much better.

## 2014-07-30 NOTE — Progress Notes (Signed)
[facility] [address] [place], [place] [address] Phone: [phone] Subjective:    I'm seeing this patient by the request  of:  [name], MD   CC: Right hip pain  [id number] Chris Stokes is a 80 y.o. male coming in with complaint of right hip pain. Patient states that he has had this pain for multiple months. Patient does not remember any true injury. Patient did see primary care provider and did have x-rays were reviewed by me today. Patient is a moderate osteophytic changes of the hips bilaterally. Patient states that he has a soreness it seems to start on the posterior aspect of his hip he states it radiates down the lateral anterior aspect of his leg all the way down to his foot intermittently. Patient describes it as a dull aching pain with a sharp pain at that time. Worse with ambulation and better with rest. Patient has been on long-term prednisone now for the last 7 weeks secondary to his ulcerative colitis. Patient states that this can be associated with some back pain. Denies any weakness. Patient does have a past medical history significant for neuropathy and is on gabapentin. Patient rates the severity of 6/10.     Past medical history, social, surgical and family history all reviewed in electronic medical record.   Review of Systems: No headache, visual changes, nausea, vomiting, diarrhea, constipation, dizziness, abdominal pain, skin rash, fevers, chills, night sweats, weight loss, swollen lymph nodes, body aches, joint swelling, muscle aches, chest pain, shortness of breath, mood changes.   Objective Blood pressure 136/82, pulse 69, height 5\' 10"  (1.778 m), weight 192 lb (87.091 kg), SpO2 92.00%.  General: No apparent distress alert and oriented x3 mood and affect normal, dressed appropriately.  HEENT: Pupils equal, extraocular movements intact  Respiratory: Patient's speak in full sentences and does not appear short of breath    Cardiovascular: No lower extremity edema, non tender, no erythema  Skin: Warm dry intact with no signs of infection or rash on extremities or on axial skeleton.  Abdomen: Soft nontender  Neuro: Cranial nerves II through XII are intact, neurovascularly intact in all extremities with 2+ DTRs and 2+ pulses.  Lymph: No lymphadenopathy of posterior or anterior cervical chain or axillae bilaterally.  Gait normal with good balance and coordination.  MSK:  Non tender with full range of motion and good stability and symmetric strength and tone of shoulders, elbows, wrist,  knee and ankles bilaterally.  Back Exam:  Inspection: Unremarkable  Motion: Shows mild restriction in extension lacking the last 10 as well as last 5 of flexion and last 5 of rotation bilaterally. SLR laying: Negative  XSLR laying: Negative  Palpable tenderness: Tender to palpation over the paraspinal musculature mostly over L5 and S1.[name] FABER: Sore Sensory change: Gross sensation intact to all lumbar and sacral dermatomes.  Reflexes: 2+ at both patellar tendons, 2+ at achilles tendons, Babinski's downgoing.  Strength at foot  Plantar-flexion: 5/5 Dorsi-flexion: 5/5 Eversion: 5/5 Inversion: 5/5    Hip: Right ROM IR: 25 Deg, ER: 25 Deg, Flexion: 120 Deg, Extension: 70 Deg, Abduction: 45 Deg, Adduction: 45 Deg Strength IR: 4/5, ER: 4/5, Flexion: 5/5, Extension: 4/5, Abduction: 4/5, Adduction: 5/5 Pelvic alignment unremarkable to inspection and palpation. Standing hip rotation and gait without trendelenburg sign / unsteadiness. Greater trochanter without tenderness to palpation. No tenderness over piriformis and greater trochanter. No pain with FABER or FADIR. Does have tenderness to palpation over the right paraspinal musculature  of the lumbar and sacral area.. Strength is symmetric compared to contralateral side  MSK US performed of: Right This study was ordered, performed, and interpreted by Charlann Boxer  D.O.  Hip: Trochanteric bursa with calcific changes and mild hypoechoic changes. Patient is tender in this area Acetabular labrum visualized with chronic changes and mild to moderate osteophytic changes of the joint Femoral neck appears unremarkable without increased power doppler signal along Cortex.  IMPRESSION:  Mild greater trochanteric bursitis with calcific changes    Impression and Recommendations:     This case required medical decision making of moderate complexity.

## 2014-07-30 NOTE — Assessment & Plan Note (Signed)
The patient has now been on prednisone for show significant amount of time. Patient's right hip does have moderate osteophytic changes but no signs of avascular necrosis. Based on patient's symptoms are more concerned the patient may have back osteophytic changes causing intermittent nerve compression most likely on the L5 nerve root. At like to get back x-rays to rule out any compression fracture secondary to chronic steroid use. We attempted to discuss with patient about the possibility of doing an injection for the greater trochanteric bursitis. Do not feel comfortable doing any anti-inflammatories secondary to his ulcerative colitis. We discussed an icing regimen and over-the-counter medications that would be safe and effective for some of his arthritic pain. Patient was given a home exercise program to do 3 times a week. Patient and will come back and see me again in 2-3 weeks. Continuing to have pain I would like to do a greater trochanteric bursitis injection for diagnostic as well as potentially therapeutic depending on evaluation at that time.

## 2014-08-06 ENCOUNTER — Ambulatory Visit (INDEPENDENT_AMBULATORY_CARE_PROVIDER_SITE_OTHER): Payer: Medicare Other | Admitting: Internal Medicine

## 2014-08-06 ENCOUNTER — Encounter: Payer: Self-pay | Admitting: Internal Medicine

## 2014-08-06 VITALS — BP 140/82 | HR 60 | Temp 97.5°F | Resp 16 | Wt 193.0 lb

## 2014-08-06 DIAGNOSIS — N3946 Mixed incontinence: Secondary | ICD-10-CM

## 2014-08-06 DIAGNOSIS — Z23 Encounter for immunization: Secondary | ICD-10-CM

## 2014-08-06 NOTE — Progress Notes (Signed)
Pre visit review using our clinic review tool, if applicable. No additional management support is needed unless otherwise documented below in the visit note. 

## 2014-08-06 NOTE — Assessment & Plan Note (Signed)
Better off Flomax

## 2014-08-07 ENCOUNTER — Encounter: Payer: Self-pay | Admitting: Internal Medicine

## 2014-08-07 NOTE — Progress Notes (Signed)
Patient ID: Chris Stokes, male   DOB: 03-21-1929, 80 y.o.   MRN: [id number]   Subjective:   F/u legs not working well - "disconnected", stumbling, "restless" at night.Marland KitchenMarland KitchenBetter now  They burnless. No LBP   HPIHe saw Dr Hampton Abbot peeing too much - incontinent x months. Flomax is not helping. Urine looks clear.  F/u gout - better, pulmonary fibrosis The patient presents for a follow-up of  chronic hypertension (BP is up off a diuretic), colitis, chronic dyslipidemia, CAD controlled with medicines.  F/u on tremor - better; cough resolved F/u runny nose when eating... - better Per family - some loss of functioning and memory issues  BP Readings from Last 3 Encounters:  08/06/14 140/82  07/30/14 136/82  07/23/14 140/72   Wt Readings from Last 3 Encounters:  08/06/14 193 lb (87.544 kg)  07/30/14 192 lb (87.091 kg)  07/23/14 197 lb (89.359 kg)      Review of Systems  Constitutional: Positive for fatigue. Negative for appetite change and unexpected weight change.  HENT: Positive for congestion and rhinorrhea. Negative for nosebleeds, sneezing, sore throat and trouble swallowing.   Eyes: Negative for itching and visual disturbance.  Respiratory: Positive for cough and shortness of breath.   Cardiovascular: Negative for chest pain, palpitations and leg swelling.  Gastrointestinal: Negative for diarrhea, blood in stool and abdominal distention.  Musculoskeletal: Negative for back pain, gait problem, joint swelling and neck pain.  Skin: Negative for rash and wound.  Neurological: Positive for tremors. Negative for dizziness, speech difficulty, weakness, light-headedness and headaches.  Psychiatric/Behavioral: Negative for suicidal ideas, sleep disturbance, dysphoric mood and agitation. The patient is nervous/anxious.        Objective:   Physical Exam  Constitutional: He is oriented to person, place, and time. He appears well-developed. No distress.  NAD A little dyspneic  HENT:   Mouth/Throat: Oropharynx is clear and moist.  Eyes: Conjunctivae are normal. Pupils are equal, round, and reactive to light.  Neck: Normal range of motion. No JVD present. No thyromegaly present.  Cardiovascular: Normal rate, regular rhythm, normal heart sounds and intact distal pulses.  Exam reveals no gallop and no friction rub.   No murmur heard. Pulmonary/Chest: Effort normal and breath sounds normal. No respiratory distress. He has no wheezes. He has no rales. He exhibits no tenderness.  Abdominal: Soft. Bowel sounds are normal. He exhibits no distension and no mass. There is no tenderness. There is no rebound and no guarding.  Genitourinary: Guaiac negative stool. No penile tenderness.  Musculoskeletal: Normal range of motion. He exhibits no edema and no tenderness.  Lymphadenopathy:    He has no cervical adenopathy.  Neurological: He is alert and oriented to person, place, and time. He has normal reflexes. No cranial nerve deficit. He exhibits normal muscle tone. Coordination abnormal.  Skin: Skin is warm and dry. No rash noted. No erythema.  Psychiatric: He has a normal mood and affect. His behavior is normal. Judgment and thought content normal.  Intentional tremor, ataxia - better Recalls 1/3 in 5 min Serial 7s w/one error R hip is less tender with ROM; lateral prox femur is less tender Str leg elev is neg B     Lab Results  Component Value Date   WBC 8.3 10/02/2012   HGB 13.9 10/02/2012   HCT 41.2 10/02/2012   PLT 133.0* 10/02/2012   GLUCOSE 98 08/23/2013   CHOL 137 06/02/2012   TRIG 112.0 06/02/2012   HDL 36.30* 06/02/2012   LDLCALC 78  06/02/2012   ALT 14 06/02/2012   AST 19 06/02/2012   NA 137 08/23/2013   K 4.5 08/23/2013   CL 104 08/23/2013   CREATININE 1.2 08/23/2013   BUN 16 08/23/2013   CO2 30 08/23/2013   TSH 18.60* 06/02/2012   PSA 1.91 11/09/2010        Assessment & Plan:

## 2014-08-08 ENCOUNTER — Encounter: Payer: Self-pay | Admitting: Internal Medicine

## 2014-08-20 ENCOUNTER — Encounter: Payer: Self-pay | Admitting: Family Medicine

## 2014-08-20 ENCOUNTER — Other Ambulatory Visit (INDEPENDENT_AMBULATORY_CARE_PROVIDER_SITE_OTHER): Payer: Medicare Other

## 2014-08-20 ENCOUNTER — Ambulatory Visit (INDEPENDENT_AMBULATORY_CARE_PROVIDER_SITE_OTHER): Payer: Medicare Other | Admitting: Family Medicine

## 2014-08-20 VITALS — BP 142/82 | HR 56 | Wt 193.0 lb

## 2014-08-20 DIAGNOSIS — M76899 Other specified enthesopathies of unspecified lower limb, excluding foot: Secondary | ICD-10-CM

## 2014-08-20 DIAGNOSIS — M7061 Trochanteric bursitis, right hip: Secondary | ICD-10-CM | POA: Insufficient documentation

## 2014-08-20 DIAGNOSIS — M7071 Other bursitis of hip, right hip: Secondary | ICD-10-CM

## 2014-08-20 DIAGNOSIS — M25551 Pain in right hip: Secondary | ICD-10-CM

## 2014-08-20 DIAGNOSIS — M25559 Pain in unspecified hip: Secondary | ICD-10-CM

## 2014-08-20 NOTE — Progress Notes (Signed)
[facility] [place],  [address] Phone: [phone] Subjective:      CC: Right hip pain followup  [id number] Chris Stokes is a 80 y.o. male coming in with complaint of right hip pain. Patient was having pain mostly on the lateral aspect of his hip and we'll did have a greater trochanteric bursitis. Patient does have mild to moderate osteophytic changes of the hips bilaterally as well as some facet arthropathy in the lumbar spine. Patient was given exercises for the hip as well as for the back. We also discussed icing regimen as well as over-the-counter medications and tramadol for pain relief. Patient states overall he is only about 10% better. Patient has been doing exercises in the icing and taking any medications regularly. Patient was unable to take some of the over-the-counter medicine secondary to upset stomach. States that he continues to have pain especially going from a seated to standing position. As a dull aching throbbing on the lateral aspect but denies any groin pain. Still has his chronic backache as well.  Past medical history, social, surgical and family history all reviewed in electronic medical record.   Review of Systems: No headache, visual changes, nausea, vomiting, diarrhea, constipation, dizziness, abdominal pain, skin rash, fevers, chills, night sweats, weight loss, swollen lymph nodes, body aches, joint swelling, muscle aches, chest pain, shortness of breath, mood changes.   Objective Blood pressure 142/82, pulse 56, weight 193 lb (87.544 kg), SpO2 91.00%.  General: No apparent distress alert and oriented x3 mood and affect normal, dressed appropriately.  HEENT: Pupils equal, extraocular movements intact  Respiratory: Patient's speak in full sentences and does not appear short of breath  Cardiovascular: No lower extremity edema, non tender, no erythema  Skin: Warm dry intact with no signs of infection or rash on  extremities or on axial skeleton.  Abdomen: Soft nontender  Neuro: Cranial nerves II through XII are intact, neurovascularly intact in all extremities with 2+ DTRs and 2+ pulses.  Lymph: No lymphadenopathy of posterior or anterior cervical chain or axillae bilaterally.  Gait normal with good balance and coordination.  MSK:  Non tender with full range of motion and good stability and symmetric strength and tone of shoulders, elbows, wrist,  knee and ankles bilaterally.  Back Exam:  Inspection: Unremarkable  Motion: Shows mild restriction in extension lacking the last 10 as well as last 5 of flexion and last 5 of rotation bilaterally. SLR laying: Negative  XSLR laying: Negative  Palpable tenderness: Tender to palpation over the paraspinal musculature mostly over L5 and S1.[name] FABER: Sore Sensory change: Gross sensation intact to all lumbar and sacral dermatomes.  Reflexes: 2+ at both patellar tendons, 2+ at achilles tendons, Babinski's downgoing.  Strength at foot  Plantar-flexion: 5/5 Dorsi-flexion: 5/5 Eversion: 5/5 Inversion: 5/5    Hip: Right ROM IR: 25 Deg, ER: 25 Deg, Flexion: 120 Deg, Extension: 70 Deg, Abduction: 45 Deg, Adduction: 45 Deg Strength IR: 4/5, ER: 4/5, Flexion: 5/5, Extension: 4/5, Abduction: 4/5, Adduction: 5/5 Pelvic alignment unremarkable to inspection and palpation. Standing hip rotation and gait without trendelenburg sign / unsteadiness. Greater trochanter without tenderness to palpation in more than previous exam. No tenderness over piriformis and greater trochanter. No pain with FABER or FADIR. Does have tenderness to palpation over the right paraspinal musculature of the lumbar and sacral area.. Strength is symmetric compared to contralateral side  MSK US performed of: Right This study was ordered, performed, and interpreted by Thedore Mins  Smith D.O.  Hip: Trochanteric bursa with calcific changes and mild hypoechoic changes. Patient is tender in this  area Acetabular labrum visualized with chronic changes and mild to moderate osteophytic changes of the joint Femoral neck appears unremarkable without increased power doppler signal along Cortex.  IMPRESSION:   greater trochanteric bursitis with calcific changes still present   Procedure: Real-time Ultrasound Guided Injection of right greater trochanteric bursitis secondary to patient's body habitus Device: GE Logiq E  Ultrasound guided injection is preferred based studies that show increased duration, increased effect, greater accuracy, decreased procedural pain, increased response rate, and decreased cost with ultrasound guided versus blind injection.  Verbal informed consent obtained.  Time-out conducted.  Noted no overlying erythema, induration, or other signs of local infection.  Skin prepped in a sterile fashion.  Local anesthesia: Topical Ethyl chloride.  With sterile technique and under real time ultrasound guidance:  Greater trochanteric area was visualized and patient's bursa was noted. A 22-gauge 3 inch needle was inserted and 4 cc of 0.5% Marcaine and 1 cc of Kenalog 40 mg/dL was injected. Pictures taken Completed without difficulty  Pain immediately resolved suggesting accurate placement of the medication.  Advised to call if fevers/chills, erythema, induration, drainage, or persistent bleeding.  Images permanently stored and available for review in the ultrasound unit.  Impression: Technically successful ultrasound guided injection.    Impression and Recommendations:     This case required medical decision making of moderate complexity.

## 2014-08-20 NOTE — Patient Instructions (Signed)
It is good to see you We tried an injection in the side of your hip today. If it does not make much of a difference then we need to consider looking at your back in greater detail.  Ice is your friend.  Continue the exercises and we will start physical therapy.  1 visit could make sure we are doing the exercises correctly.  Continue the medicines and continue the icing.  Come back again in 3-4 weeks and tell me how you are doing.

## 2014-08-20 NOTE — Assessment & Plan Note (Signed)
Patient was given injection today. Patient tolerated the procedure very well. Discussed with patient that this is diagnostic as well. Patient does not make any improvement we will expander differential to include lumbar radiculopathy. Patient's x-rays do show some moderate arthritis. Patient think will respond very well to this injection and we discussed that this may cause him to increase his blood sugar for a short course. Discuss continuing the icing and home exercises and patient was referred to formal physical therapy. No prescriptions are given today but will continue with a tramadol as needed. Patient will follow up with me again in 3-4 weeks for further evaluation and treatment.  Spent greater than 25 minutes with patient face-to-face and had greater than 50% of counseling including as described above in assessment and plan.

## 2014-08-27 ENCOUNTER — Ambulatory Visit: Payer: Medicare Other | Attending: Internal Medicine | Admitting: Physical Therapy

## 2014-08-27 DIAGNOSIS — M545 Low back pain, unspecified: Secondary | ICD-10-CM | POA: Diagnosis not present

## 2014-08-27 DIAGNOSIS — IMO0001 Reserved for inherently not codable concepts without codable children: Secondary | ICD-10-CM | POA: Diagnosis not present

## 2014-08-29 ENCOUNTER — Ambulatory Visit (INDEPENDENT_AMBULATORY_CARE_PROVIDER_SITE_OTHER): Payer: Medicare Other | Admitting: Internal Medicine

## 2014-08-29 ENCOUNTER — Encounter: Payer: Self-pay | Admitting: Internal Medicine

## 2014-08-29 VITALS — BP 120/60 | HR 68 | Ht 70.0 in | Wt 194.2 lb

## 2014-08-29 DIAGNOSIS — K219 Gastro-esophageal reflux disease without esophagitis: Secondary | ICD-10-CM

## 2014-08-29 DIAGNOSIS — K5190 Ulcerative colitis, unspecified, without complications: Secondary | ICD-10-CM

## 2014-08-29 NOTE — Progress Notes (Signed)
HISTORY OF PRESENT ILLNESS:  Chris Stokes is a 80 y.o. male with multiple medical problems as listed below. He is followed in this office for left-sided of colitis. Last colonoscopy January 2014 was normal. He was last seen in the office 06/11/2014 with a mild to moderate flare of his colitis. He was continued on mesalamine 4.8 g daily and initiated on prednisone taper with beginning dosage of 40 mg daily. Treatment duration was to be for 8 weeks. Discontinued a little early by his PCP when he complained of hip pain. Negative x-rays. He presents today for GI followup as planned. He reports that he is significantly better. Currently with 2 bowel movements daily. These are slightly loose and rarely associated with mucus but no blood. No abdominal pain. Good appetite. Weight is unchanged from last office visit. No new problems or issues. He is currently on mesalamine 4.8 g daily. He also continues on PPI for GERD with good control  REVIEW OF SYSTEMS:  All non-GI ROS negative except for sinus and allergy trouble, arthritis, back pain, muscle cramps, urinary leakage, shortness of breath with exertion  Past Medical History  Diagnosis Date  . CAD (coronary artery disease)     Dr. Mare Ferrari  . Hyperlipidemia   . Hypertension   . Hypothyroidism   . Ulcerative colitis     Dr Henrene Pastor  . Hiatal hernia   . History of heart attack   . Pulmonary fibrosis     new dx-couple weeks/ no O2  . Seasonal allergies   . Cataract 1999    bilateral cataracts   . Pulmonary fibrosis 12/08/12  . GERD (gastroesophageal reflux disease) 2012  . Chronic kidney disease     multiple kidney stone flare ups  . Myocardial infarction 1984  . Rectal fissure   . Colon polyps     adenomatous    Past Surgical History  Procedure Laterality Date  . Abdominal aortic aneurysm repair  1992  . Coronary artery bypass graft  2004    4 vessel bypass  . Colonoscopy    . Polypectomy      Social History Chris Stokes  reports that he  quit smoking about 47 years ago. His smoking use included Cigarettes. He has a 50 pack-year smoking history. He has never used smokeless tobacco. He reports that he does not drink alcohol or use illicit drugs.  family history includes Arthritis/Rheumatoid in his mother; Colon cancer in his cousin; Emphysema in his brother and father; Hyperlipidemia in his brother, father, and sister; Hypertension in his father and other; Lymphoma in his sister.  Allergies  Allergen Reactions  . Lipitor [Atorvastatin]     cramps  . Ramipril     cough  . Simvastatin     REACTION: myalgia  . Sulfonamide Derivatives     REACTION: rash       PHYSICAL EXAMINATION: Vital signs: BP 120/60  Pulse 68  Ht 5\' 10"  (1.778 m)  Wt 194 lb 3.2 oz (88.089 kg)  BMI 27.86 kg/m2 General: Well-developed, well-nourished, no acute distress HEENT: Sclerae are anicteric, conjunctiva pink. Oral mucosa intact Lungs: Clear Heart: Regular Abdomen: soft, nontender, nondistended, no obvious ascites, no peritoneal signs, normal bowel sounds. No organomegaly. Extremities: No edema Psychiatric: alert and oriented x3. Cooperative   ASSESSMENT:  #1. Left-sided ulcerative colitis. Recent flare. Improved after course of prednisone #2. Last colonoscopy January 2014 was normal #3. GERD. Asymptomatic on PPI #4. History of adenomatous colon polyps   PLAN:  #1. Continue mesalamine 4.8  g daily #2. Continue PPI for GERD #3. Routine office followup in 6 months. Contact the office in the interim for any questions or problems.

## 2014-08-29 NOTE — Patient Instructions (Signed)
Please follow up with Dr. Perry in 6 months 

## 2014-09-04 ENCOUNTER — Telehealth: Payer: Self-pay

## 2014-09-04 MED ORDER — MESALAMINE 400 MG PO CPDR: 1600.0000 mg | DELAYED_RELEASE_CAPSULE | Freq: Three times a day (TID) | ORAL | Status: DC

## 2014-09-04 NOTE — Telephone Encounter (Signed)
Refilled Delzicol with [facility]

## 2014-09-09 ENCOUNTER — Encounter: Payer: Self-pay | Admitting: Family Medicine

## 2014-09-09 ENCOUNTER — Ambulatory Visit (INDEPENDENT_AMBULATORY_CARE_PROVIDER_SITE_OTHER): Payer: Medicare Other | Admitting: Family Medicine

## 2014-09-09 VITALS — BP 132/84 | HR 55 | Ht 70.0 in | Wt 192.0 lb

## 2014-09-09 DIAGNOSIS — M7061 Trochanteric bursitis, right hip: Secondary | ICD-10-CM

## 2014-09-09 DIAGNOSIS — M1A0790 Idiopathic chronic gout, unspecified ankle and foot, without tophus (tophi): Secondary | ICD-10-CM

## 2014-09-09 MED ORDER — ALLOPURINOL 100 MG PO TABS: 200.0000 mg | ORAL_TABLET | Freq: Every day | ORAL | Status: DC

## 2014-09-09 NOTE — Assessment & Plan Note (Signed)
Patient is approximately 75% better after the injection. I think it'll continue to do relatively well. I do think that there is a likelihood the patient is also having some neuropathy that is contributing. Patient will continue on the gabapentin. I would like patient to increase his Neurontin indicates that some of this is secondary to a crystal arthropathy. There is also a good chance that this is radicular symptoms from patient's back or unfortunately hip. If he continues to have pain we may need to consider therapeutic as well as hopefully diagnostic injection of either the hip or the back itself. The patient continues to do well to we'll continue to monitor. Patient does have significant comorbidities and I like to avoid significant amount of medications. Patient will come back again in 4-6 weeks for further evaluation and treatment.  Spent greater than 25 minutes with patient face-to-face and had greater than 50% of counseling including as described above in assessment and plan.

## 2014-09-09 NOTE — Progress Notes (Signed)
  [facility] [place], [place] [address] Phone: [phone] Subjective:      CC: Right hip pain followup  [id number] Chris Stokes is a 80 y.o. male coming in with complaint of right hip pain. Patient was having pain mostly on the lateral aspect of his hip and we'll did have a greater trochanteric bursitis. Patient does have mild to moderate osteophytic changes of the hips bilaterally as well as some facet arthropathy in the lumbar spine. Patient was given exercises for the hip as well as for the back. We also discussed icing regimen as well as over-the-counter medications and tramadol for pain relief. Patient at last visit did have a ultrasound guided injection of the greater trochanteric bursa. Patient states he has noticed some improvement with him taking gabapentin fairly regularly. Patient is having some worsening of his gout. Only taking 100 mg of allopurinol. Patient denies any new symptoms and states that he is resting comfortable. States that physical therapy is helping significantly well.  Past medical history, social, surgical and family history all reviewed in electronic medical record.   Review of Systems: No headache, visual changes, nausea, vomiting, diarrhea, constipation, dizziness, abdominal pain, skin rash, fevers, chills, night sweats, weight loss, swollen lymph nodes, body aches, joint swelling, muscle aches, chest pain, shortness of breath, mood changes.   Objective Blood pressure 132/84, pulse 55, height 5\' 10"  (1.778 m), weight 192 lb (87.091 kg), SpO2 93.00%.  General: No apparent distress alert and oriented x3 mood and affect normal, dressed appropriately.  HEENT: Pupils equal, extraocular movements intact  Respiratory: Patient's speak in full sentences and does not appear short of breath  Cardiovascular: No lower extremity edema, non tender, no erythema  Skin: Warm dry intact with no signs of infection or rash on extremities  or on axial skeleton.  Abdomen: Soft nontender  Neuro: Cranial nerves II through XII are intact, neurovascularly intact in all extremities with 2+ DTRs and 2+ pulses.  Lymph: No lymphadenopathy of posterior or anterior cervical chain or axillae bilaterally.  Gait normal with good balance and coordination.  MSK:  Non tender with full range of motion and good stability and symmetric strength and tone of shoulders, elbows, wrist,  knee and ankles bilaterally.  Back Exam:  Inspection: Unremarkable  Motion: Shows mild restriction in extension lacking the last 10 as well as last 5 of flexion and last 5 of rotation bilaterally. SLR laying: Negative  XSLR laying: Negative  Palpable tenderness: Tender to palpation over the paraspinal musculature mostly over L5 and S1.[name] FABER: Sore Sensory change: Gross sensation intact to all lumbar and sacral dermatomes.  Reflexes: 2+ at both patellar tendons, 2+ at achilles tendons, Babinski's downgoing.  Strength at foot  Plantar-flexion: 5/5 Dorsi-flexion: 5/5 Eversion: 5/5 Inversion: 5/5    Hip: Right ROM IR: 25 Deg, ER: 25 Deg, Flexion: 120 Deg, Extension: 70 Deg, Abduction: 45 Deg, Adduction: 45 Deg Strength IR: 4/5, ER: 4/5, Flexion: 5/5, Extension: 4/5, Abduction: 4/5, Adduction: 5/5 Pelvic alignment unremarkable to inspection and palpation. Standing hip rotation and gait without trendelenburg sign / unsteadiness. Greater trochanter without tenderness to palpation  No tenderness over piriformis and greater trochanter. No pain with FABER or FADIR. Nontender over the back. Strength is symmetric compared to contralateral side      Impression and Recommendations:     This case required medical decision making of moderate complexity.

## 2014-09-09 NOTE — Patient Instructions (Addendum)
Good to see you Chris Stokes will always be your friend.  Continue the exercises 3 times a week.  Finish with physical therapy.  Increase your allopurinaol to 200mg  daily.  Continue the nuerontin.  Give yourself a pat on the back.  It could be good for you to check in again in 4-6 weeks.

## 2014-09-10 ENCOUNTER — Ambulatory Visit: Payer: Medicare Other | Attending: Internal Medicine | Admitting: Physical Therapy

## 2014-09-10 DIAGNOSIS — Z951 Presence of aortocoronary bypass graft: Secondary | ICD-10-CM | POA: Diagnosis not present

## 2014-09-10 DIAGNOSIS — I10 Essential (primary) hypertension: Secondary | ICD-10-CM | POA: Insufficient documentation

## 2014-09-10 DIAGNOSIS — I2510 Atherosclerotic heart disease of native coronary artery without angina pectoris: Secondary | ICD-10-CM | POA: Diagnosis not present

## 2014-09-10 DIAGNOSIS — Z9889 Other specified postprocedural states: Secondary | ICD-10-CM | POA: Insufficient documentation

## 2014-09-10 DIAGNOSIS — M71551 Other bursitis, not elsewhere classified, right hip: Secondary | ICD-10-CM | POA: Insufficient documentation

## 2014-09-10 DIAGNOSIS — M545 Low back pain: Secondary | ICD-10-CM | POA: Insufficient documentation

## 2014-09-18 ENCOUNTER — Ambulatory Visit: Payer: Medicare Other | Admitting: Physical Therapy

## 2014-09-18 DIAGNOSIS — M545 Low back pain: Secondary | ICD-10-CM | POA: Diagnosis not present

## 2014-09-19 ENCOUNTER — Ambulatory Visit: Payer: Medicare Other | Admitting: Physical Therapy

## 2014-09-23 ENCOUNTER — Ambulatory Visit: Payer: Medicare Other | Admitting: Internal Medicine

## 2014-10-22 ENCOUNTER — Other Ambulatory Visit (INDEPENDENT_AMBULATORY_CARE_PROVIDER_SITE_OTHER): Payer: Medicare Other

## 2014-10-22 ENCOUNTER — Ambulatory Visit (INDEPENDENT_AMBULATORY_CARE_PROVIDER_SITE_OTHER): Payer: Medicare Other | Admitting: Internal Medicine

## 2014-10-22 ENCOUNTER — Telehealth: Payer: Self-pay | Admitting: Internal Medicine

## 2014-10-22 ENCOUNTER — Encounter: Payer: Self-pay | Admitting: Internal Medicine

## 2014-10-22 VITALS — BP 158/90 | HR 76 | Ht 70.0 in | Wt 190.2 lb

## 2014-10-22 DIAGNOSIS — R197 Diarrhea, unspecified: Secondary | ICD-10-CM

## 2014-10-22 DIAGNOSIS — R319 Hematuria, unspecified: Secondary | ICD-10-CM

## 2014-10-22 DIAGNOSIS — K5190 Ulcerative colitis, unspecified, without complications: Secondary | ICD-10-CM

## 2014-10-22 LAB — CBC WITH DIFFERENTIAL/PLATELET
BASOS ABS: 0 10*3/uL (ref 0.0–0.1)
BASOS PCT: 0.4 % (ref 0.0–3.0)
Eosinophils Absolute: 0.2 10*3/uL (ref 0.0–0.7)
Eosinophils Relative: 2.8 % (ref 0.0–5.0)
HCT: 44.3 % (ref 39.0–52.0)
Hemoglobin: 14.7 g/dL (ref 13.0–17.0)
Lymphocytes Relative: 26.8 % (ref 12.0–46.0)
Lymphs Abs: 1.9 10*3/uL (ref 0.7–4.0)
MCHC: 33.1 g/dL (ref 30.0–36.0)
MCV: 101.8 fl — AB (ref 78.0–100.0)
MONO ABS: 0.8 10*3/uL (ref 0.1–1.0)
Monocytes Relative: 11.2 % (ref 3.0–12.0)
NEUTROS ABS: 4.2 10*3/uL (ref 1.4–7.7)
NEUTROS PCT: 58.8 % (ref 43.0–77.0)
Platelets: 132 10*3/uL — ABNORMAL LOW (ref 150.0–400.0)
RBC: 4.35 Mil/uL (ref 4.22–5.81)
RDW: 14.5 % (ref 11.5–15.5)
WBC: 7.2 10*3/uL (ref 4.0–10.5)

## 2014-10-22 LAB — COMPREHENSIVE METABOLIC PANEL
ALK PHOS: 64 U/L (ref 39–117)
ALT: 16 U/L (ref 0–53)
AST: 20 U/L (ref 0–37)
Albumin: 3.6 g/dL (ref 3.5–5.2)
BUN: 16 mg/dL (ref 6–23)
CO2: 25 mEq/L (ref 19–32)
CREATININE: 1.1 mg/dL (ref 0.4–1.5)
Calcium: 9.6 mg/dL (ref 8.4–10.5)
Chloride: 107 mEq/L (ref 96–112)
GFR: 65.18 mL/min (ref >60.00)
Glucose, Bld: 90 mg/dL (ref 70–99)
Potassium: 4.6 mEq/L (ref 3.5–5.1)
Sodium: 140 mEq/L (ref 135–145)
Total Bilirubin: 0.6 mg/dL (ref 0.2–1.2)
Total Protein: 7.6 g/dL (ref 6.0–8.3)

## 2014-10-22 LAB — URINALYSIS, ROUTINE W REFLEX MICROSCOPIC
Bilirubin Urine: NEGATIVE
Hgb urine dipstick: NEGATIVE
KETONES UR: NEGATIVE
Leukocytes, UA: NEGATIVE
Nitrite: NEGATIVE
RBC / HPF: NONE SEEN (ref 0–?)
TOTAL PROTEIN, URINE-UPE24: NEGATIVE
URINE GLUCOSE: NEGATIVE
Urobilinogen, UA: 0.2 (ref 0.0–1.0)
WBC, UA: NONE SEEN (ref 0–?)
pH: 5.5 (ref 5.0–8.0)

## 2014-10-22 MED ORDER — PREDNISONE 20 MG PO TABS: ORAL_TABLET | ORAL | Status: DC

## 2014-10-22 NOTE — Telephone Encounter (Signed)
Pt called and states that he has a h/o UC and that he has been having diarrhea for about 2-3 days and that it is very discolored. Pt is not sure if there is blood in the stool or not, states his urine is discolored also.  Pt states he is taking delzicol 1600mg  tid. Pt requests to be seen but there are no appts available today or tomorrow. Please advise.

## 2014-10-22 NOTE — Telephone Encounter (Signed)
Have him come in this morning. We will work him in somewhere. Let Magda Paganini know. Thanks

## 2014-10-22 NOTE — Telephone Encounter (Signed)
Pt aware and scheduled at 10:30am, pt states he will be here in about 40min.

## 2014-10-22 NOTE — Patient Instructions (Addendum)
Your physician has requested that you go to the basement for lab work before leaving today    We have sent the following medications to your pharmacy for you to pick up at your convenience:  Prednisone - take 40mg  for a week and then 30 mg for a week and then 20mg  for a week and then 10mg  and then stop.   Please call next week if your symptoms are not improving.  Please follow up with Dr. Henrene Pastor on 12/13/2014 at 3:15pm

## 2014-10-22 NOTE — Progress Notes (Signed)
HISTORY OF PRESENT ILLNESS:  Chris Stokes is a 80 y.o. male with multiple medical problems as listed below. He is followed in this office for left-sided ulcerative colitis. Last colonoscopy January 2014 was normal. In July 2015 he experienced moderate flare for which mesalamine was continued and prednisone started. His last office follow-up was 08/29/2014. At that time he was better after a course of prednisone. He has continued on mesalamine. Routine follow-up in 6 months recommended. He states that he was doing well until 3 days ago when he developed problems with increased stool frequency, less formed stools, and mucus. He contacted the office requesting to be seen today, and was worked in to the schedule. No obvious blood. Significant urgency. No nausea, vomiting, fevers, or significant abdominal pain. He also reports possibly seeing blood in his urine, which has resolved. He does have a history of kidney stones. No dysuria.  REVIEW OF SYSTEMS:  All non-GI ROS negative except for fatigue and dark urine (transiently)  Past Medical History  Diagnosis Date  . CAD (coronary artery disease)     Dr. Mare Ferrari  . Hyperlipidemia   . Hypertension   . Hypothyroidism   . Ulcerative colitis     Dr Henrene Pastor  . Hiatal hernia   . History of heart attack   . Pulmonary fibrosis     new dx-couple weeks/ no O2  . Seasonal allergies   . Cataract 1999    bilateral cataracts   . Pulmonary fibrosis 12/08/12  . GERD (gastroesophageal reflux disease) 2012  . Chronic kidney disease     multiple kidney stone flare ups  . Myocardial infarction 1984  . Rectal fissure   . Colon polyps     adenomatous    Past Surgical History  Procedure Laterality Date  . Abdominal aortic aneurysm repair  1992  . Coronary artery bypass graft  2004    4 vessel bypass  . Colonoscopy    . Polypectomy      Social History Chris Stokes  reports that he quit smoking about 47 years ago. His smoking use included Cigarettes. He has a  50 pack-year smoking history. He has never used smokeless tobacco. He reports that he does not drink alcohol or use illicit drugs.  family history includes Arthritis/Rheumatoid in his mother; Colon cancer in his cousin; Emphysema in his brother and father; Hyperlipidemia in his brother, father, and sister; Hypertension in his father and other; Lymphoma in his sister.  Allergies  Allergen Reactions  . Lipitor [Atorvastatin]     cramps  . Ramipril     cough  . Simvastatin     REACTION: myalgia  . Sulfonamide Derivatives     REACTION: rash       PHYSICAL EXAMINATION: Vital signs: BP 158/90 mmHg  Pulse 76  Ht 5\' 10"  (1.778 m)  Wt 190 lb 3.2 oz (86.274 kg)  BMI 27.29 kg/m2  Constitutional: Slightly fatigue but otherwise generally well-appearing, no acute distress Psychiatric: alert and oriented x3, cooperative Eyes: extraocular movements intact, anicteric, conjunctiva pink Mouth: oral pharynx moist, no lesions Neck: supple no lymphadenopathy Cardiovascular: heart regular rate and rhythm, no murmur Lungs: clear to auscultation bilaterally Abdomen: soft, nontender, nondistended, no obvious ascites, no peritoneal signs, normal bowel sounds, no organomegaly Rectal: Omitted Extremities: no lower extremity edema bilaterally Skin: no lesions on visible extremities Neuro: No focal deficits.   ASSESSMENT:  #1. History of left-sided ulcerative colitis. Last colonoscopy January 2014 was normal. Now seems to be experiencing a flare of  disease. Flare several months ago treated successfully with prednisone #2. Question blood in urine  PLAN:  #1. CBC, comprehensive metabolic panel, #2. Clostridium difficile by PCR, stool #3. Urinalysis #4. Prednisone taper. 40 mg daily for 1 week then decrease by 10 mg each week until off. Medication prescribed #5. Routine follow-up in 6 weeks. I have asked him to contact the office next week if he is not doing better

## 2014-10-23 LAB — CLOSTRIDIUM DIFFICILE BY PCR: Toxigenic C. Difficile by PCR: NOT DETECTED

## 2014-11-05 ENCOUNTER — Encounter: Payer: Self-pay | Admitting: Internal Medicine

## 2014-11-05 ENCOUNTER — Ambulatory Visit (INDEPENDENT_AMBULATORY_CARE_PROVIDER_SITE_OTHER): Payer: Medicare Other | Admitting: Internal Medicine

## 2014-11-05 VITALS — BP 122/84 | HR 48 | Temp 97.6°F | Ht 70.0 in | Wt 195.8 lb

## 2014-11-05 DIAGNOSIS — F0390 Unspecified dementia without behavioral disturbance: Secondary | ICD-10-CM

## 2014-11-05 DIAGNOSIS — I259 Chronic ischemic heart disease, unspecified: Secondary | ICD-10-CM

## 2014-11-05 DIAGNOSIS — I10 Essential (primary) hypertension: Secondary | ICD-10-CM

## 2014-11-05 DIAGNOSIS — K5190 Ulcerative colitis, unspecified, without complications: Secondary | ICD-10-CM

## 2014-11-05 DIAGNOSIS — E038 Other specified hypothyroidism: Secondary | ICD-10-CM

## 2014-11-05 DIAGNOSIS — E034 Atrophy of thyroid (acquired): Secondary | ICD-10-CM

## 2014-11-05 DIAGNOSIS — Z23 Encounter for immunization: Secondary | ICD-10-CM

## 2014-11-05 DIAGNOSIS — J84112 Idiopathic pulmonary fibrosis: Secondary | ICD-10-CM

## 2014-11-05 NOTE — Assessment & Plan Note (Signed)
Continue with current prescription therapy as reflected on the Med list.  

## 2014-11-05 NOTE — Assessment & Plan Note (Signed)
12/15 - a few bouts of colitis - resolved on Prednisone taper

## 2014-11-05 NOTE — Assessment & Plan Note (Signed)
Stable

## 2014-11-05 NOTE — Progress Notes (Signed)
Subjective:   C/o a few bouts of colitis - resolved on Prednisone taper  F/u legs not working well - "disconnected", stumbling, "restless" at night.Chris KitchenMarland KitchenBetter now. They burn less. No LBP   HPI   F/u peeing too much - incontinent x months. Flomax, Myrbetriq did not help. Urine looks clear.  F/u gout - better, pulmonary fibrosis The patient presents for a follow-up of  chronic hypertension (BP is up off a diuretic), colitis, chronic dyslipidemia, CAD controlled with medicines.  F/u on tremor - better; cough resolved F/u runny nose when eating... - better Per family - some loss of functioning and memory issues  BP Readings from Last 3 Encounters:  11/05/14 122/84  10/22/14 158/90  09/09/14 132/84   Wt Readings from Last 3 Encounters:  11/05/14 195 lb 12 oz (88.792 kg)  10/22/14 190 lb 3.2 oz (86.274 kg)  09/09/14 192 lb (87.091 kg)      Review of Systems  Constitutional: Positive for fatigue. Negative for appetite change and unexpected weight change.  HENT: Positive for congestion and rhinorrhea. Negative for nosebleeds, sneezing, sore throat and trouble swallowing.   Eyes: Negative for itching and visual disturbance.  Respiratory: Positive for cough and shortness of breath.   Cardiovascular: Negative for chest pain, palpitations and leg swelling.  Gastrointestinal: Negative for diarrhea, blood in stool and abdominal distention.  Musculoskeletal: Negative for back pain, joint swelling, gait problem and neck pain.  Skin: Negative for rash and wound.  Neurological: Positive for tremors. Negative for dizziness, speech difficulty, weakness, light-headedness and headaches.  Psychiatric/Behavioral: Negative for suicidal ideas, sleep disturbance, dysphoric mood and agitation. The patient is nervous/anxious.        Objective:   Physical Exam  Constitutional: He is oriented to person, place, and time. He appears well-developed. No distress.  NAD  HENT:  Mouth/Throat: Oropharynx  is clear and moist.  Eyes: Conjunctivae are normal. Pupils are equal, round, and reactive to light.  Neck: Normal range of motion. No JVD present. No thyromegaly present.  Cardiovascular: Normal rate, regular rhythm, normal heart sounds and intact distal pulses.  Exam reveals no gallop and no friction rub.   No murmur heard. Pulmonary/Chest: Effort normal and breath sounds normal. No respiratory distress. He has no wheezes. He has no rales. He exhibits no tenderness.  Abdominal: Soft. Bowel sounds are normal. He exhibits no distension and no mass. There is no tenderness. There is no rebound and no guarding.  Musculoskeletal: Normal range of motion. He exhibits no edema or tenderness.  Lymphadenopathy:    He has no cervical adenopathy.  Neurological: He is alert and oriented to person, place, and time. He has normal reflexes. No cranial nerve deficit. He exhibits normal muscle tone. He displays a negative Romberg sign. Coordination and gait normal.  No meningeal signs  Skin: Skin is warm and dry. No rash noted.  Psychiatric: He has a normal mood and affect. His behavior is normal. Judgment and thought content normal.  Intentional tremor, ataxia - better Recalls 1/3 in 5 min Serial 7s w/one error R hip is less tender with ROM; lateral prox femur is less tender Str leg elev is neg B Lungs w/B crackles    Lab Results  Component Value Date   WBC 7.2 10/22/2014   HGB 14.7 10/22/2014   HCT 44.3 10/22/2014   PLT 132.0* 10/22/2014   GLUCOSE 90 10/22/2014   CHOL 137 06/02/2012   TRIG 112.0 06/02/2012   HDL 36.30* 06/02/2012   LDLCALC 78 06/02/2012  ALT 16 10/22/2014   AST 20 10/22/2014   NA 140 10/22/2014   K 4.6 10/22/2014   CL 107 10/22/2014   CREATININE 1.1 10/22/2014   BUN 16 10/22/2014   CO2 25 10/22/2014   TSH 18.60* 06/02/2012   PSA 1.91 11/09/2010        Assessment & Plan:

## 2014-11-05 NOTE — Progress Notes (Signed)
Pre visit review using our clinic review tool, if applicable. No additional management support is needed unless otherwise documented below in the visit note. 

## 2014-11-07 ENCOUNTER — Other Ambulatory Visit: Payer: Self-pay | Admitting: Internal Medicine

## 2014-11-07 ENCOUNTER — Telehealth: Payer: Self-pay | Admitting: Internal Medicine

## 2014-11-07 NOTE — Telephone Encounter (Signed)
emmi emailed °

## 2014-12-04 ENCOUNTER — Encounter: Payer: Self-pay | Admitting: Pulmonary Disease

## 2014-12-04 ENCOUNTER — Ambulatory Visit (INDEPENDENT_AMBULATORY_CARE_PROVIDER_SITE_OTHER): Payer: PPO | Admitting: Pulmonary Disease

## 2014-12-04 VITALS — BP 138/86 | HR 54 | Temp 97.8°F | Ht 70.5 in | Wt 196.0 lb

## 2014-12-04 DIAGNOSIS — J84112 Idiopathic pulmonary fibrosis: Secondary | ICD-10-CM

## 2014-12-04 NOTE — Patient Instructions (Signed)
Continue to stay as active as possible, and let us know if you see a significant decline in your breathing prior to your next visit. Will see you back in 21mos with same day chest xray and breathing studies.

## 2014-12-04 NOTE — Assessment & Plan Note (Addendum)
The patient has been fairly stable since the last visit from a pulmonary standpoint, although he has seen some decline in his breathing that he does not feel is overly significant. He denies any significant dry cough, but is having postnasal drip with a globus sensation.  We have had a long discussion again today about anti-fibrotic therapy, and I have reviewed with him both the risks and benefits. I am very hesitant to put him on one of the new medications because of his underlying ulcerative colitis and the very common side effect of diarrhea with the new medications. His symptoms, chest x-ray, and pulmonary function studies have been fairly stable, and therefore I think the risk at this point outweighed the benefits. We do need to watch him very closely, and he is due for a follow-up chest x-ray and breathing studies this summer. If we see a significant decline, we can discuss further treatment.  Time spent with pt today discussing the above was 25 min.

## 2014-12-04 NOTE — Addendum Note (Signed)
Addended by: Inge Rise on: 12/04/2014 10:45 AM   Modules accepted: Orders

## 2014-12-04 NOTE — Progress Notes (Signed)
   Subjective:    Patient ID: Chris Stokes, male    DOB: Jul 24, 1929, 81 y.o.   MRN: [id number]  HPI The patient comes in today for follow-up of his known idiopathic pulmonary fibrosis. Been fairly stable since the last visit, with no significant decline in his exertional tolerance, and no significant cough. He did have a flare of his ulcerative colitis, and this did affect his breathing for a short period of time.  He feels that he is back to his usual baseline. The patient wishes to discuss again the role of anti-fibrotic therapy with respect to treatment of his lung disease.   Review of Systems  Constitutional: Negative for fever and unexpected weight change.  HENT: Positive for congestion and sinus pressure. Negative for dental problem, ear pain, nosebleeds, postnasal drip, rhinorrhea, sneezing, sore throat and trouble swallowing.   Eyes: Negative for redness and itching.  Respiratory: Positive for shortness of breath. Negative for cough, chest tightness and wheezing.   Cardiovascular: Negative for palpitations and leg swelling.  Gastrointestinal: Negative for nausea and vomiting.  Genitourinary: Negative for dysuria.  Musculoskeletal: Negative for joint swelling.  Skin: Negative for rash.  Neurological: Negative for headaches.  Hematological: Does not bruise/bleed easily.  Psychiatric/Behavioral: Negative for dysphoric mood. The patient is not nervous/anxious.        Objective:   Physical Exam Well-developed male in no acute distress Nose without purulence or discharge noted Neck without lymphadenopathy or thyromegaly Chest with crackles one third the way up bilaterally, no wheezing Cardiac exam with regular rate and rhythm Lower extremities with 1+ edema, no cyanosis Alert and oriented, moves all 4 extremities.       Assessment & Plan:

## 2014-12-13 ENCOUNTER — Ambulatory Visit: Payer: Medicare Other | Admitting: Internal Medicine

## 2014-12-13 ENCOUNTER — Ambulatory Visit (INDEPENDENT_AMBULATORY_CARE_PROVIDER_SITE_OTHER): Payer: PPO | Admitting: Internal Medicine

## 2014-12-13 ENCOUNTER — Encounter: Payer: Self-pay | Admitting: Internal Medicine

## 2014-12-13 VITALS — BP 140/70 | HR 60 | Ht 70.0 in | Wt 194.1 lb

## 2014-12-13 DIAGNOSIS — K5190 Ulcerative colitis, unspecified, without complications: Secondary | ICD-10-CM

## 2014-12-13 MED ORDER — MESALAMINE 400 MG PO CPDR: 1600.0000 mg | DELAYED_RELEASE_CAPSULE | Freq: Three times a day (TID) | ORAL | Status: DC

## 2014-12-13 NOTE — Patient Instructions (Signed)
We have sent the following medications to your pharmacy for you to pick up at your convenience:  Delzicol  Please follow up in one year  

## 2014-12-13 NOTE — Progress Notes (Signed)
HISTORY OF PRESENT ILLNESS:  Chris Stokes is a 81 y.o. male with multiple medical problems as listed below who is followed in this office for left-sided ulcerative colitis. He was last evaluated 10/22/2014 with a colitis flare. Blood work, stool studies, and urinalysis unremarkable. He was placed on prednisone taper which she has completed. He presents today for follow-up. He is maintained on Delzicol 4.8 g daily. He is pleased report that he is doing well. He relates 2-3 formed bowel movements daily without mucus or blood. No abdominal pain. Good appetite. Weight is stable. GI review of systems is otherwise negative.  REVIEW OF SYSTEMS:  All non-GI ROS negative except for sinus and allergy, arthritis, muscle pains, shortness of breath, urinary leakage  Past Medical History  Diagnosis Date  . CAD (coronary artery disease)     Dr. Mare Ferrari  . Hyperlipidemia   . Hypertension   . Hypothyroidism   . Ulcerative colitis     Dr Henrene Pastor  . Hiatal hernia   . History of heart attack   . Pulmonary fibrosis     new dx-couple weeks/ no O2  . Seasonal allergies   . Cataract 1999    bilateral cataracts   . Pulmonary fibrosis 12/08/12  . GERD (gastroesophageal reflux disease) 2012  . Chronic kidney disease     multiple kidney stone flare ups  . Myocardial infarction 1984  . Rectal fissure   . Colon polyps     adenomatous    Past Surgical History  Procedure Laterality Date  . Abdominal aortic aneurysm repair  1992  . Coronary artery bypass graft  2004    4 vessel bypass  . Colonoscopy    . Polypectomy      Social History Monnie Tupou  reports that he quit smoking about 48 years ago. His smoking use included Cigarettes. He has a 50 pack-year smoking history. He has never used smokeless tobacco. He reports that he does not drink alcohol or use illicit drugs.  family history includes Arthritis/Rheumatoid in his mother; Colon cancer in his cousin; Emphysema in his brother and father;  Hyperlipidemia in his brother, father, and sister; Hypertension in his father and other; Lymphoma in his sister.  Allergies  Allergen Reactions  . Lipitor [Atorvastatin]     cramps  . Ramipril     cough  . Simvastatin     REACTION: myalgia  . Sulfonamide Derivatives     REACTION: rash       PHYSICAL EXAMINATION: Vital signs: BP 140/70 mmHg  Pulse 60  Ht 5\' 10"  (1.778 m)  Wt 194 lb 2 oz (88.055 kg)  BMI 27.85 kg/m2 General: Well-developed, well-nourished, no acute distress HEENT: Sclerae are anicteric, conjunctiva pink. Oral mucosa intact Lungs: Clear Heart: Regular Abdomen: soft, nontender, nondistended, no obvious ascites, no peritoneal signs, normal bowel sounds. No organomegaly. Extremities: No edema Psychiatric: alert and oriented x3. Cooperative   ASSESSMENT:  #1. Left-sided ulcerative colitis. Recent flare successfully treated with course of prednisone   PLAN:  #1. Continue Delzicol 4.8 g daily. Refilled #2. Routine GI follow-up in 1 year. Sooner if needed. #3. Resume general medical care with PCP

## 2014-12-24 ENCOUNTER — Telehealth: Payer: Self-pay | Admitting: *Deleted

## 2014-12-24 NOTE — Telephone Encounter (Signed)
Received call from pt daughter-in-law wanting to see did md check pt b12 level at last blood draw. Inform her last B12 was check back in 05/2012. Father-in-law has been experiencing fatigue just being tired. Was talking with his son about having his B12 check. Pt has schedule appt to see md inform her when he come in for appt need to let md know of sxs and ask to have b12 check...Chris Stokes

## 2014-12-31 ENCOUNTER — Ambulatory Visit: Payer: PPO | Admitting: Internal Medicine

## 2015-01-02 ENCOUNTER — Ambulatory Visit (INDEPENDENT_AMBULATORY_CARE_PROVIDER_SITE_OTHER): Payer: PPO | Admitting: Cardiology

## 2015-01-02 ENCOUNTER — Encounter: Payer: Self-pay | Admitting: Cardiology

## 2015-01-02 VITALS — BP 122/78 | HR 53 | Ht 70.0 in | Wt 190.0 lb

## 2015-01-02 DIAGNOSIS — I10 Essential (primary) hypertension: Secondary | ICD-10-CM

## 2015-01-02 DIAGNOSIS — R0609 Other forms of dyspnea: Secondary | ICD-10-CM

## 2015-01-02 DIAGNOSIS — I259 Chronic ischemic heart disease, unspecified: Secondary | ICD-10-CM

## 2015-01-02 DIAGNOSIS — E7800 Pure hypercholesterolemia, unspecified: Secondary | ICD-10-CM

## 2015-01-02 NOTE — Progress Notes (Signed)
Cardiology Office Note   Date:  01/02/2015   ID:  Chris Stokes, DOB 1929/11/29, MRN [id number]  PCP:  [name], MD  Cardiologist:   Darlin Coco, MD   No chief complaint on file.     History of Present Illness: Chris Stokes is a 81 y.o. male who presents for follow-up office visit  This pleasant 81 year old gentleman is seen for a scheduled followup visit.  We have not seen him since 2013.  He does have a history of known ischemic heart disease. He had a previous myocardial infarction and in 2004 he underwent coronary artery bypass graft surgery. In February 2008 he had an nuclear stress test which showed an old inferolateral scar but no reversible ischemia. His ejection fraction was 40%. His echocardiogram in the past has shown impaired relaxation. He does not have any significant valvular heart disease by echo. He has been diagnosed with idiopathic pulmonary fibrosis.  There is also a past history of asbestos exposure.  He is followed by Dr. Gwenette Greet.  He has exertional dyspnea climbing 2 flights of stairs. From the cardiac standpoint he has been stable.  He has not been experiencing any chest pain or angina.  He has not been aware of any racing or skipping of his heart. He has a history of chronic colitis.  About 4 months ago he had a bad flareup of colitis had to be on high dose prednisone under the direction of Dr. Henrene Pastor.  Past Medical History  Diagnosis Date  . CAD (coronary artery disease)     Dr. Mare Ferrari  . Hyperlipidemia   . Hypertension   . Hypothyroidism   . Ulcerative colitis     Dr Henrene Pastor  . Hiatal hernia   . History of heart attack   . Pulmonary fibrosis     new dx-couple weeks/ no O2  . Seasonal allergies   . Cataract 1999    bilateral cataracts   . Pulmonary fibrosis 12/08/12  . GERD (gastroesophageal reflux disease) 2012  . Chronic kidney disease     multiple kidney stone flare ups  . Myocardial infarction 1984  . Rectal fissure   . Colon polyps    adenomatous    Past Surgical History  Procedure Laterality Date  . Abdominal aortic aneurysm repair  1992  . Coronary artery bypass graft  2004    4 vessel bypass  . Colonoscopy    . Polypectomy       Current Outpatient Prescriptions  Medication Sig Dispense Refill  . allopurinol (ZYLOPRIM) 100 MG tablet Take 2 tablets (200 mg total) by mouth daily. 180 tablet 3  . amLODipine (NORVASC) 5 MG tablet Take 1 tablet (5 mg total) by mouth daily. 90 tablet 3  . aspirin 81 MG tablet Take 81 mg by mouth daily.      . carvedilol (COREG) 25 MG tablet Take 0.5 tablets (12.5 mg total) by mouth 2 (two) times daily with a meal. 90 tablet 3  . Cholecalciferol (VITAMIN D3) 2000 UNITS TABS Take 1 tablet by mouth 2 (two) times daily.      [name] donepezil (ARICEPT) 10 MG tablet Take 1 tablet (10 mg total) by mouth at bedtime. 90 tablet 3  . Fexofenadine HCl (KLS ALLER-FEX PO) Take by mouth. 120 mg take one tablet by mouth daily     . folic acid (FOLVITE) 086 MCG tablet Take 800 mcg by mouth daily.      [name] gabapentin (NEURONTIN) 300 MG capsule TAKE 1 BY MOUTH  3 TIMES DAILY AS NEEDED FOR TINGLING, BURNING IN FEET 270 capsule 1  . Garlic 7062 MG CAPS Take 1 capsule by mouth 2 (two) times daily.      . Glucosamine-Chondroit-Vit C-Mn (GLUCOSAMINE 1500 COMPLEX) CAPS Take 1 each by mouth 2 (two) times daily.    [name] ipratropium (ATROVENT) 0.06 % nasal spray Place 2 sprays into the nose 4 (four) times daily as needed for rhinitis (runny nose). 45 mL 2  . levothyroxine (SYNTHROID) 137 MCG tablet Take 1 tablet (137 mcg total) by mouth daily before breakfast. 90 tablet 3  . losartan (COZAAR) 100 MG tablet Take 1 tablet (100 mg total) by mouth daily. 90 tablet 3  . Mesalamine (DELZICOL) 400 MG CPDR DR capsule Take 4 capsules (1,600 mg total) by mouth 3 (three) times daily. 1100 capsule 1  . Omega-3 Fatty Acids (FISH OIL) 1000 MG CAPS Take 1,000 mg by mouth daily.     [name] omeprazole (PRILOSEC) 20 MG capsule Take 20 mg by mouth  daily as needed.      [name] OVER THE COUNTER MEDICATION Liquid Life 1 oz. daily    . primidone (MYSOLINE) 50 MG tablet Take 1 tablet (50 mg total) by mouth 3 (three) times daily. For tremor 270 tablet 3  . rosuvastatin (CRESTOR) 10 MG tablet Take 1 tablet (10 mg total) by mouth daily. 90 tablet 3  . traMADol (ULTRAM) 50 MG tablet Take 1-2 tablets (50-100 mg total) by mouth 2 (two) times daily as needed. 60 tablet 1   No current facility-administered medications for this visit.    Allergies:   Lipitor; Ramipril; Simvastatin; and Sulfonamide derivatives    Social History:  The patient  reports that he quit smoking about 48 years ago. His smoking use included Cigarettes. He has a 50 pack-year smoking history. He has never used smokeless tobacco. He reports that he does not drink alcohol or use illicit drugs.   Family History:  The patient's family history includes Arthritis/Rheumatoid in his mother; Colon cancer in his cousin; Emphysema in his brother and father; Hyperlipidemia in his brother, father, and sister; Hypertension in his father and other; Lymphoma in his sister.    ROS:  Please see the history of present illness.   Otherwise, review of systems are positive for none.   All other systems are reviewed and negative.    PHYSICAL EXAM: VS:  BP 122/78 mmHg  Pulse 53  Ht 5\' 10"  (1.778 m)  Wt 190 lb (86.183 kg)  BMI 27.26 kg/m2 , BMI Body mass index is 27.26 kg/(m^2). GEN: Well nourished, well developed, in no acute distress HEENT: normal Neck: no JVD, carotid bruits, or masses Cardiac: RRR; no murmurs, rubs, or gallops,no edema  Respiratory:  There are fine inspiratory rales at left base consistent with pulmonary fibrosis., normal work of breathing GI: soft, nontender, nondistended, + BS MS: no deformity or atrophy Skin: warm and dry, no rash Neuro:  Strength and sensation are intact Psych: euthymic mood, full affect   EKG:  EKG is ordered today. The ekg ordered today demonstrates  sinus bradycardia with first-degree AV block, left axis deviation, moderate voltage for LVH.  Old inferior wall MI.  Since the prior tracing of 10/02/12, no significant change   Recent Labs: 10/22/2014: ALT 16; BUN 16; Creatinine 1.1; Hemoglobin 14.7; Platelets 132.0*; Potassium 4.6; Sodium 140    Lipid Panel    Component Value Date/Time   CHOL 137 06/02/2012 1516   TRIG 112.0 06/02/2012 1516  HDL 36.30* 06/02/2012 1516   CHOLHDL 4 06/02/2012 1516   VLDL 22.4 06/02/2012 1516   LDLCALC 78 06/02/2012 1516      Wt Readings from Last 3 Encounters:  01/02/15 190 lb (86.183 kg)  12/13/14 194 lb 2 oz (88.055 kg)  12/04/14 196 lb (88.905 kg)      Other studies Reviewed:    ASSESSMENT AND PLAN: 1. Ischemic heart disease status post old inferior wall myocardial infarction and status post CABG in 2008.  No recent angina pectoris.  His last Myoview stress test on 10/10/12 showed an old inferior wall scar but no reversible ischemia and his ejection fraction was improved at 53%. 2.  Pulmonary fibrosis followed by Dr. Gwenette Greet 3.  Hypothyroidism, on Synthroid, followed by Dr. Alain Marion 4.  History of ulcerative colitis   Current medicines are reviewed at length with the patient today.  The patient does not have concerns regarding medicines.  The following changes have been made:  no change  Labs/ tests ordered today include:   Orders Placed This Encounter  Procedures  . EKG 12-Lead     Disposition:   FU with Dr. Mare Ferrari in 1 Year for office visit and EKG   Signed, Darlin Coco, MD  01/02/2015 1:20 PM    [facility] [address], [place],   [address] Phone: [phone]; Fax: [phone]

## 2015-01-02 NOTE — Patient Instructions (Addendum)
Your physician recommends that you continue on your current medications as directed. Please refer to the Current Medication list given to you today.  Your physician wants you to follow-up in: 1 YEAR OV/EKG  You will receive a reminder letter in the mail two months in advance. If you don't receive a letter, please call our office to schedule the follow-up appointment.  

## 2015-01-29 ENCOUNTER — Telehealth: Payer: Self-pay | Admitting: Internal Medicine

## 2015-01-29 MED ORDER — MESALAMINE 400 MG PO CPDR: 1600.0000 mg | DELAYED_RELEASE_CAPSULE | Freq: Three times a day (TID) | ORAL | Status: DC

## 2015-01-29 NOTE — Telephone Encounter (Signed)
Sent Delzicol rx to [facility] on [address] per patient's request.

## 2015-02-03 ENCOUNTER — Telehealth: Payer: Self-pay | Admitting: Internal Medicine

## 2015-02-04 ENCOUNTER — Other Ambulatory Visit (INDEPENDENT_AMBULATORY_CARE_PROVIDER_SITE_OTHER): Payer: PPO

## 2015-02-04 ENCOUNTER — Ambulatory Visit (INDEPENDENT_AMBULATORY_CARE_PROVIDER_SITE_OTHER): Payer: PPO | Admitting: Internal Medicine

## 2015-02-04 ENCOUNTER — Encounter: Payer: Self-pay | Admitting: Internal Medicine

## 2015-02-04 VITALS — BP 160/98 | HR 62 | Temp 97.7°F | Wt 190.0 lb

## 2015-02-04 DIAGNOSIS — I10 Essential (primary) hypertension: Secondary | ICD-10-CM

## 2015-02-04 DIAGNOSIS — K5190 Ulcerative colitis, unspecified, without complications: Secondary | ICD-10-CM

## 2015-02-04 DIAGNOSIS — E038 Other specified hypothyroidism: Secondary | ICD-10-CM

## 2015-02-04 DIAGNOSIS — I119 Hypertensive heart disease without heart failure: Secondary | ICD-10-CM

## 2015-02-04 DIAGNOSIS — E034 Atrophy of thyroid (acquired): Secondary | ICD-10-CM

## 2015-02-04 DIAGNOSIS — M1A0790 Idiopathic chronic gout, unspecified ankle and foot, without tophus (tophi): Secondary | ICD-10-CM

## 2015-02-04 DIAGNOSIS — E785 Hyperlipidemia, unspecified: Secondary | ICD-10-CM

## 2015-02-04 DIAGNOSIS — F0390 Unspecified dementia without behavioral disturbance: Secondary | ICD-10-CM

## 2015-02-04 DIAGNOSIS — G629 Polyneuropathy, unspecified: Secondary | ICD-10-CM

## 2015-02-04 LAB — BASIC METABOLIC PANEL
BUN: 17 mg/dL (ref 6–23)
CALCIUM: 10 mg/dL (ref 8.4–10.5)
CO2: 29 mEq/L (ref 19–32)
Chloride: 106 mEq/L (ref 96–112)
Creatinine, Ser: 1.09 mg/dL (ref 0.40–1.50)
GFR: 68.6 mL/min (ref >60.00)
Glucose, Bld: 93 mg/dL (ref 70–99)
POTASSIUM: 4.4 meq/L (ref 3.5–5.1)
SODIUM: 138 meq/L (ref 135–145)

## 2015-02-04 LAB — LIPID PANEL
CHOL/HDL RATIO: 8
Cholesterol: 252 mg/dL — ABNORMAL HIGH (ref 0–200)
HDL: 33.4 mg/dL — AB (ref >39.00)
LDL Cholesterol: 190 mg/dL — ABNORMAL HIGH (ref 0–99)
NONHDL: 218.6
TRIGLYCERIDES: 141 mg/dL (ref 0.0–149.0)
VLDL: 28.2 mg/dL (ref 0.0–40.0)

## 2015-02-04 LAB — HEPATIC FUNCTION PANEL
ALBUMIN: 3.9 g/dL (ref 3.5–5.2)
ALK PHOS: 61 U/L (ref 39–117)
ALT: 21 U/L (ref 0–53)
AST: 22 U/L (ref 0–37)
Bilirubin, Direct: 0.1 mg/dL (ref 0.0–0.3)
TOTAL PROTEIN: 8 g/dL (ref 6.0–8.3)
Total Bilirubin: 0.4 mg/dL (ref 0.2–1.2)

## 2015-02-04 LAB — URIC ACID: URIC ACID, SERUM: 3.9 mg/dL — AB (ref 4.0–7.8)

## 2015-02-04 MED ORDER — AMLODIPINE BESYLATE 5 MG PO TABS: 5.0000 mg | ORAL_TABLET | Freq: Every day | ORAL | Status: DC

## 2015-02-04 MED ORDER — LOSARTAN POTASSIUM 100 MG PO TABS: 100.0000 mg | ORAL_TABLET | Freq: Every day | ORAL | Status: DC

## 2015-02-04 MED ORDER — ALLOPURINOL 100 MG PO TABS: 200.0000 mg | ORAL_TABLET | Freq: Every day | ORAL | Status: DC

## 2015-02-04 MED ORDER — PRIMIDONE 50 MG PO TABS: 50.0000 mg | ORAL_TABLET | Freq: Three times a day (TID) | ORAL | Status: DC

## 2015-02-04 MED ORDER — CARVEDILOL 25 MG PO TABS: 12.5000 mg | ORAL_TABLET | Freq: Two times a day (BID) | ORAL | Status: DC

## 2015-02-04 MED ORDER — LEVOTHYROXINE SODIUM 137 MCG PO TABS: 137.0000 ug | ORAL_TABLET | Freq: Every day | ORAL | Status: DC

## 2015-02-04 MED ORDER — GABAPENTIN 300 MG PO CAPS: 300.0000 mg | ORAL_CAPSULE | Freq: Three times a day (TID) | ORAL | Status: DC

## 2015-02-04 MED ORDER — DONEPEZIL HCL 10 MG PO TABS: 10.0000 mg | ORAL_TABLET | Freq: Every day | ORAL | Status: DC

## 2015-02-04 NOTE — Assessment & Plan Note (Signed)
Chronic -- statin intolerant 

## 2015-02-04 NOTE — Assessment & Plan Note (Signed)
Chronic On Coreg, Losartan - cont Rx

## 2015-02-04 NOTE — Progress Notes (Signed)
Subjective:   F/u a few bouts of colitis - resolved on Prednisone taper  F/u legs not working well - "disconnected", stumbling, "restless" at night.Marland KitchenMarland KitchenBetter now. They burn less. No LBP   HPI   F/u peeing too much - incontinent x months. Flomax, Myrbetriq did not help. Urine looks clear.  F/u gout - better, pulmonary fibrosis  The patient presents for a follow-up of  chronic hypertension (BP is up off a diuretic), colitis, chronic dyslipidemia, CAD controlled with medicines.  F/u on tremor - better; cough resolved F/u runny nose when eating... - better  Per family - some loss of functioning and memory issues  BP Readings from Last 3 Encounters:  02/04/15 160/98  01/02/15 122/78  12/13/14 140/70   Wt Readings from Last 3 Encounters:  02/04/15 190 lb (86.183 kg)  01/02/15 190 lb (86.183 kg)  12/13/14 194 lb 2 oz (88.055 kg)      Review of Systems  Constitutional: Positive for fatigue. Negative for appetite change and unexpected weight change.  HENT: Positive for congestion and rhinorrhea. Negative for nosebleeds, sneezing, sore throat and trouble swallowing.   Eyes: Negative for itching and visual disturbance.  Respiratory: Positive for cough and shortness of breath.   Cardiovascular: Negative for chest pain, palpitations and leg swelling.  Gastrointestinal: Negative for diarrhea, blood in stool and abdominal distention.  Musculoskeletal: Negative for back pain, joint swelling, gait problem and neck pain.  Skin: Negative for rash and wound.  Neurological: Positive for tremors. Negative for dizziness, speech difficulty, weakness, light-headedness and headaches.  Psychiatric/Behavioral: Negative for suicidal ideas, sleep disturbance, dysphoric mood and agitation. The patient is nervous/anxious.        Objective:   Physical Exam  Constitutional: He is oriented to person, place, and time. He appears well-developed. No distress.  NAD  HENT:  Mouth/Throat: Oropharynx is  clear and moist.  Eyes: Conjunctivae are normal. Pupils are equal, round, and reactive to light.  Neck: Normal range of motion. No JVD present. No thyromegaly present.  Cardiovascular: Normal rate, regular rhythm, normal heart sounds and intact distal pulses.  Exam reveals no gallop and no friction rub.   No murmur heard. Pulmonary/Chest: Effort normal and breath sounds normal. No respiratory distress. He has no wheezes. He has no rales. He exhibits no tenderness.  Abdominal: Soft. Bowel sounds are normal. He exhibits no distension and no mass. There is no tenderness. There is no rebound and no guarding.  Musculoskeletal: Normal range of motion. He exhibits no edema or tenderness.  Lymphadenopathy:    He has no cervical adenopathy.  Neurological: He is alert and oriented to person, place, and time. He has normal reflexes. No cranial nerve deficit. He exhibits normal muscle tone. He displays a negative Romberg sign. Coordination and gait normal.  No meningeal signs  Skin: Skin is warm and dry. No rash noted.  Psychiatric: He has a normal mood and affect. His behavior is normal. Judgment and thought content normal.  Intentional tremor, ataxia - better Recalls 1/3 in 5 min Serial 7s w/one error R hip is less tender with ROM; lateral prox femur is less tender Str leg elev is neg B Lungs w/B crackles    Lab Results  Component Value Date   WBC 7.2 10/22/2014   HGB 14.7 10/22/2014   HCT 44.3 10/22/2014   PLT 132.0* 10/22/2014   GLUCOSE 90 10/22/2014   CHOL 137 06/02/2012   TRIG 112.0 06/02/2012   HDL 36.30* 06/02/2012   LDLCALC 78 06/02/2012  ALT 16 10/22/2014   AST 20 10/22/2014   NA 140 10/22/2014   K 4.6 10/22/2014   CL 107 10/22/2014   CREATININE 1.1 10/22/2014   BUN 16 10/22/2014   CO2 25 10/22/2014   TSH 18.60* 06/02/2012   PSA 1.91 11/09/2010        Assessment & Plan:

## 2015-02-04 NOTE — Assessment & Plan Note (Signed)
On Mesalamine 

## 2015-02-04 NOTE — Assessment & Plan Note (Signed)
Pt declined Rx 

## 2015-02-04 NOTE — Progress Notes (Signed)
Pre visit review using our clinic review tool, if applicable. No additional management support is needed unless otherwise documented below in the visit note. 

## 2015-02-04 NOTE — Assessment & Plan Note (Signed)
Gabapentin helped

## 2015-02-11 NOTE — Telephone Encounter (Signed)
Checking on status of message.

## 2015-02-18 MED ORDER — MESALAMINE 800 MG PO TBEC: 2.0000 | DELAYED_RELEASE_TABLET | Freq: Three times a day (TID) | ORAL | Status: DC

## 2015-02-18 NOTE — Telephone Encounter (Signed)
Pharmacy called back and said Asacol HD came back with a $45 copay.  Called patient's daughter to tell her and let her know they were going to go ahead and fill the Asacol 800 rx at this price.  She acknowledged and understood.

## 2015-02-18 NOTE — Telephone Encounter (Signed)
Spoke with pharmacy who is going to run rx for Asacol 800 to see if it is any cheaper.  Spoke to patient's daughter and told her if the cost was still to high I would try to initiated a prior authorization on his Delzicol.  I told her I would call her back after I heard back from the pharmacy.  Patient's daughter agreed.

## 2015-04-14 ENCOUNTER — Telehealth: Payer: Self-pay | Admitting: Internal Medicine

## 2015-04-15 NOTE — Telephone Encounter (Signed)
Ok. 40 x 2 weeks, then 30 x 2 weeks, then 20 mg daily. Get him an appointment to see AP in 2 weeks.

## 2015-04-15 NOTE — Telephone Encounter (Signed)
Dr Henrene Pastor , Patient is having a Flare , [phone]     Wants a prednisone rx sent in today

## 2015-04-15 NOTE — Telephone Encounter (Signed)
Pt calling back about a refill on her Prednisone

## 2015-04-16 MED ORDER — PREDNISONE 10 MG PO TABS: ORAL_TABLET | ORAL | Status: DC

## 2015-04-16 NOTE — Telephone Encounter (Signed)
Pt aware, script sent to the pharmacy. Pt states he will call back to schedule an appt, states he does not have his calendar with him.

## 2015-04-17 ENCOUNTER — Telehealth: Payer: Self-pay | Admitting: Internal Medicine

## 2015-04-17 NOTE — Telephone Encounter (Signed)
Pt scheduled to see Tye Savoy NP 04/29/15@3pm . Pt aware of appt.

## 2015-04-29 ENCOUNTER — Encounter: Payer: Self-pay | Admitting: Nurse Practitioner

## 2015-04-29 ENCOUNTER — Ambulatory Visit (INDEPENDENT_AMBULATORY_CARE_PROVIDER_SITE_OTHER): Payer: PPO | Admitting: Nurse Practitioner

## 2015-04-29 VITALS — BP 142/82 | HR 78 | Ht 70.0 in | Wt 192.0 lb

## 2015-04-29 DIAGNOSIS — K5190 Ulcerative colitis, unspecified, without complications: Secondary | ICD-10-CM

## 2015-04-29 NOTE — Progress Notes (Signed)
     History of Present Illness:  Patient is an 81 year old male known to Dr. Henrene Pastor. He has a history of UC diagnosed 30+ years ago. He is maintained on Mesamline and needs Prednisone once, sometimes twice a year for flares. He began having flare symptoms again several weeks. He started himself on Prednisone 40mg  daily. Patient called the office 04/15/15 and was given tapering schedule. Since he had already taken 2 weeks of 40mg  daily patient decreased to 30mg  daily for two weeks. A couple of days ago he reduced to 20mg  daily. His symptoms have greatly improved. BMs are solid with only a little mucous. No blood, no abdominal pain. Good appetite.    Current Medications, Allergies, Past Medical History, Past Surgical History, Family History and Social History were reviewed in [facility] record.  : Physical Exam: General: Pleasant, well developed , white male in no acute distress Head: Normocephalic and atraumatic Eyes:  sclerae anicteric, conjunctiva pink  Ears: Normal auditory acuity Lungs: Clear throughout to auscultation Heart: Regular rate and rhythm Abdomen: Soft, non distended, non-tender. No masses, no hepatomegaly. Normal bowel sounds Musculoskeletal: Symmetrical with no gross deformities  Extremities: No edema  Neurological: Alert oriented x 4, grossly nonfocal Psychological:  Alert and cooperative. Normal mood and affect  Assessment and Recommendations:   81 year old male with longstanding left sided UC, maintained on Delzicol 4.8 g daily. Requires Prednisone 1-2 times a year for flares. Currently on tapering dose of steroids. He is down to 20mg  daily with resolution of symptoms. Will taper him off steroids over next few weeks. Patient will call for recurrent problems.

## 2015-04-29 NOTE — Patient Instructions (Addendum)
The following is you directions for the Prednisone 10 mg.   Prednisone 10mg  taper: You have been on Prednisone 20mg  for two days.Continue 20 mg (2 tablets) for 3 more days then 15mg  (one and 1/2 tablet) daily for 5 days. Then 10mg  (1 tablet) for 5 days. Then  5mg  (1/2 tablet) every other day for 3 doses then stop Prednisone.

## 2015-04-30 ENCOUNTER — Ambulatory Visit: Payer: PPO | Admitting: Physician Assistant

## 2015-05-01 ENCOUNTER — Encounter: Payer: Self-pay | Admitting: Nurse Practitioner

## 2015-05-01 NOTE — Progress Notes (Signed)
Agree 

## 2015-06-04 ENCOUNTER — Ambulatory Visit (INDEPENDENT_AMBULATORY_CARE_PROVIDER_SITE_OTHER): Payer: PPO | Admitting: Pulmonary Disease

## 2015-06-04 ENCOUNTER — Encounter: Payer: Self-pay | Admitting: Pulmonary Disease

## 2015-06-04 ENCOUNTER — Ambulatory Visit: Payer: PPO | Admitting: Pulmonary Disease

## 2015-06-04 ENCOUNTER — Ambulatory Visit (INDEPENDENT_AMBULATORY_CARE_PROVIDER_SITE_OTHER)
Admission: RE | Admit: 2015-06-04 | Discharge: 2015-06-04 | Disposition: A | Discharge status: Home / Self Care | Payer: PPO | Source: Ambulatory Visit | Attending: Pulmonary Disease | Admitting: Pulmonary Disease

## 2015-06-04 VITALS — BP 122/80 | HR 57 | Ht 70.0 in | Wt 193.0 lb

## 2015-06-04 DIAGNOSIS — J849 Interstitial pulmonary disease, unspecified: Secondary | ICD-10-CM

## 2015-06-04 DIAGNOSIS — J84112 Idiopathic pulmonary fibrosis: Secondary | ICD-10-CM

## 2015-06-04 DIAGNOSIS — J9611 Chronic respiratory failure with hypoxia: Secondary | ICD-10-CM | POA: Insufficient documentation

## 2015-06-04 LAB — PULMONARY FUNCTION TEST
DL/VA % pred: 42 %
DL/VA: 1.95 ml/min/mmHg/L
DLCO UNC % PRED: 25 %
DLCO unc: 8.18 ml/min/mmHg
FEF 25-75 Post: 1.86 L/sec
FEF 25-75 Pre: 1.6 L/sec
FEF2575-%Change-Post: 15 %
FEF2575-%PRED-POST: 103 %
FEF2575-%PRED-PRE: 89 %
FEV1-%Change-Post: 3 %
FEV1-%Pred-Post: 80 %
FEV1-%Pred-Pre: 77 %
FEV1-POST: 2.17 L
FEV1-Pre: 2.1 L
FEV1FVC-%Change-Post: 3 %
FEV1FVC-%Pred-Pre: 106 %
FEV6-%CHANGE-POST: 0 %
FEV6-%Pred-Post: 76 %
FEV6-%Pred-Pre: 77 %
FEV6-Post: 2.75 L
FEV6-Pre: 2.77 L
FEV6FVC-%Change-Post: 0 %
FEV6FVC-%Pred-Post: 107 %
FEV6FVC-%Pred-Pre: 107 %
FVC-%CHANGE-POST: 0 %
FVC-%PRED-POST: 71 %
FVC-%Pred-Pre: 71 %
FVC-Post: 2.76 L
FVC-Pre: 2.77 L
PRE FEV1/FVC RATIO: 76 %
Post FEV1/FVC ratio: 79 %
Post FEV6/FVC ratio: 100 %
Pre FEV6/FVC Ratio: 100 %
RV % PRED: 45 %
RV: 1.24 L
TLC % pred: 59 %
TLC: 4.21 L

## 2015-06-04 NOTE — Progress Notes (Signed)
PFT done today. 

## 2015-06-04 NOTE — Progress Notes (Signed)
Chief Complaint  Patient presents with  . Follow-up    PFT and cxr today. Follow up for ILD per [facility]. Pt reports some increase in SOB since last seen.    History of Present Illness: Chris Stokes is a 81 y.o. male former smoker with IPF.  He was previously followed by Dr. Gwenette Greet.  He was initially seen in 2013.  His imaging studies report interstitial changes from as earlier as 2004.    He has noticed progressive decline in his breathing.  He finds it more difficult to keep up with activity.  He gets winded more easily.  He is not having cough.  He denies sputum, wheeze, chest pain, hemoptysis, skin rash, fever, or leg swelling.  He has hx of ulcerative colitis.  He is followed by Dr. Henrene Pastor.  He has not had flare recently, but when he has a flare it is treated with high dose steroids.  He has not noticed any difference with his breathing with steroids.  His SpO2 was 86% on room air with ambulation today.   TESTS: CT chest 11/10/12 >> centrilobular and paraseptal emphysema, patchy GGO, sub pleural reticulation, honeycombing, traction BTX, basilar predominate Labs 12/01/12 >> RNP Ab, SCL 70, Anti Jo, ANA, RF, anti CCP negative PFT 06/27/13 >> FEV1 2.59 (92%), FEV1% 80, TLC 4.50 (63%), DLCO 35% PFT 07/02/14 >> FEV1 2.46 (89%), FEV1% 77, TLC 5.02 (70%), DLCO 32% PFT 06/04/15 >> FEV1 2.17 (80%), FEV1% 79, TLC 4.21 (59%), DLCO 25% Ambulatory oximetry 06/04/15 >> SpO2 86%, start 2 liters oyxgen   Past medical hx >> CAD, HTN, HLD, Hypothyroidism, UC, HH, GERD, CKD, Allergies  Past surgical hx, Medications, Allergies, Family hx, Social hx all reviewed.   Physical Exam: BP 122/80 mmHg  Pulse 57  Ht 5\' 10"  (1.778 m)  Wt 193 lb (87.544 kg)  BMI 27.69 kg/m2  SpO2 92%  General - No distress ENT - No sinus tenderness, no oral exudate, no LAN Cardiac - s1s2 regular, no murmur Chest - basilar predominant crackles and inspiratory squeaks, no wheeze Back - No focal tenderness Abd - Soft,  non-tender Ext - No edema, no clubbing Neuro - Normal strength Skin - No rashes Psych - normal mood, and behavior   Dg Chest 2 View  06/04/2015   CLINICAL DATA:  Interstitial lung disease, chronic shortness of breath, routine checkup  EXAM: CHEST  2 VIEW  COMPARISON:  Chest x-ray of 06/03/2014  FINDINGS: Prominent coarse interstitial lung markings remain bilaterally. No definite active process is seen. However the only questionable abnormality is on the lateral view anteriorly where there is a more irregular opacity present. Some of this opacity has been present since 2013, and this most likely represents progressive scarring. No abnormality is seen in this region on prior CT chest from 2013. If further assessment is warranted CT of the chest without IV contrast media would be recommended. No effusion is seen. Cardiomegaly is stable. Median sternotomy sutures are noted from CABG.  IMPRESSION: 1. Chronic interstitial lung disease.  No definite active process. 2. More irregular opacity anteriorly on the lateral view probably represents a focus of scarring as well, but consider CT of the chest without IV contrast media if further assessment is warranted.   Electronically Signed   By: Ivar Drape M.D.   On: 06/04/2015 11:06    CMP Latest Ref Rng 02/04/2015 10/22/2014 08/23/2013  Glucose 70 - 99 mg/dL 93 90 98  BUN 6 - 23 mg/dL 17 16 16   Creatinine  0.40 - 1.50 mg/dL 1.09 1.1 1.2  Sodium 135 - 145 mEq/L 138 140 137  Potassium 3.5 - 5.1 mEq/L 4.4 4.6 4.5  Chloride 96 - 112 mEq/L 106 107 104  CO2 19 - 32 mEq/L 29 25 30   Calcium 8.4 - 10.5 mg/dL 10.0 9.6 9.7  Total Protein 6.0 - 8.3 g/dL 8.0 7.6 -  Total Bilirubin 0.2 - 1.2 mg/dL 0.4 0.6 -  Alkaline Phos 39 - 117 U/L 61 64 -  AST 0 - 37 U/L 22 20 -  ALT 0 - 53 U/L 21 16 -    CBC Latest Ref Rng 10/22/2014 10/02/2012 11/09/2010  WBC 4.0 - 10.5 K/uL 7.2 8.3 6.6  Hemoglobin 13.0 - 17.0 g/dL 14.7 13.9 14.2  Hematocrit 39.0 - 52.0 % 44.3 41.2 42.3   Platelets 150.0 - 400.0 K/uL 132.0(L) 133.0(L) 106.0(L)     Discussion: He has hx of pulmonary fibrosis.  His radiographic appearance is typical for IPF/UIP.  His previously serology has been negative.  He has noticed progression of his dyspnea.  This correlates with progression on PFT and imaging studies.  He also was found to have hypoxia on exertion today.  I had extensive conversation about natural progression of IPF.  Explained that treatment options are limited.  Based on his ulcerative colitis, there is concern about whether he would be a candidate for anti-fibrotic therapy (nintedanib or pirfenidone).    Assessment/Plan:  Idiopathic pulmonary fibrosis. Plan: - will d/w Dr. Henrene Pastor about whether anti fibrotic therapy would be contra-indicated in setting of UC - consider repeating labs and CT chest if he is willing to try anti-fibrotic therapy  Hypoxic respiratory failure Plan: - will arrange for 2 liters oxygen with activity and sleep - will arrange for overnight oximetry on room air   Chesley Mires, MD [place] Pager:  [phone]

## 2015-06-04 NOTE — Patient Instructions (Addendum)
-   Will discuss with Dr. Henrene Pastor about whether there is concern for using pulmonary fibrosis medicine and ulcerative colitis - Can look up on line for information about following 2 pulmonary fibrosis medicines: 1) Nintedanib (Ofev) 2) Pirfenidone (Esbriet) - Will arrange for home oxygen set up  - Will arrange for overnight oxygen test  Follow up in 6 months

## 2015-06-10 ENCOUNTER — Telehealth: Payer: Self-pay | Admitting: Pulmonary Disease

## 2015-06-10 NOTE — Telephone Encounter (Signed)
I have changed the DME on the 02 Order to [facility] and sent the order to Melissa/Betsy @[facility]  via staff message. I spoke with Franki Cabot [facility]) & advised [facility] should be contacting them to get pt set up on 02.  I advised Ms. Reather Laurence that once the pt is set up with [facility] to let us know & we can send an Order to [place] to d/c 02 & pick up their equipment.

## 2015-06-10 NOTE — Telephone Encounter (Signed)
I called made Chris Stokes and is aware of below. She voiced understanding and needed nothing further

## 2015-06-10 NOTE — Telephone Encounter (Signed)
Order placed 06/04/15 and was sent to [facility]. Not in network with insurance per pt. Please advise PCC;s thanks

## 2015-06-20 ENCOUNTER — Telehealth: Payer: Self-pay | Admitting: Pulmonary Disease

## 2015-06-20 NOTE — Telephone Encounter (Signed)
Called [place].  She said that she needs order to have template, but has to be backdated to the date of the original order.  Advised her that we cannot back date the orders, we would have to put it in as a new order.  Melissa said she would bring a paper order for Dr. Halford Chessman to sign that has the date of the original order on it so they can get the information that they need for Medicare to pay.  To [place] for follow up

## 2015-06-24 NOTE — Telephone Encounter (Signed)
Called [facility], spoke with [place] that they have not received a signed order back from our office regarding a recent order placed and cannot find patient in their system.  Staff message sent to [facility] to follow up on order.  Will hold in my box to follow up

## 2015-06-27 NOTE — Telephone Encounter (Signed)
Order form received to be filled out and signed by VS.  This has been placed in look at folder.  Please return to me when complete so that I can get back to [place]. Thanks.  VS, please advise.

## 2015-06-27 NOTE — Telephone Encounter (Signed)
[place], have you received an update? Thanks.

## 2015-06-30 NOTE — Telephone Encounter (Signed)
Form completed.

## 2015-07-01 ENCOUNTER — Ambulatory Visit: Payer: PPO | Admitting: Internal Medicine

## 2015-07-01 NOTE — Telephone Encounter (Signed)
Form placed in [facility] folder at [facility] office - staff message sent to [facility] to make aware that form is ready to be picked up.  Nothing further needed.

## 2015-07-10 ENCOUNTER — Ambulatory Visit (INDEPENDENT_AMBULATORY_CARE_PROVIDER_SITE_OTHER): Payer: PPO | Admitting: Family Medicine

## 2015-07-10 ENCOUNTER — Encounter: Payer: Self-pay | Admitting: Family Medicine

## 2015-07-10 ENCOUNTER — Other Ambulatory Visit (INDEPENDENT_AMBULATORY_CARE_PROVIDER_SITE_OTHER): Payer: PPO

## 2015-07-10 VITALS — BP 122/84 | HR 55 | Ht 70.0 in | Wt 191.0 lb

## 2015-07-10 DIAGNOSIS — M25551 Pain in right hip: Secondary | ICD-10-CM

## 2015-07-10 DIAGNOSIS — M7061 Trochanteric bursitis, right hip: Secondary | ICD-10-CM

## 2015-07-10 MED ORDER — TRAMADOL HCL 50 MG PO TABS: 50.0000 mg | ORAL_TABLET | Freq: Two times a day (BID) | ORAL | Status: DC | PRN

## 2015-07-10 NOTE — Assessment & Plan Note (Signed)
Patient given another injection today and we discussed that this would likely be beneficial. Differential does unfortunate includes lumbar radiculopathy and likely does have some osteophytic changes. We need to stay away from oral prednisone and oral anti-inflammatory secondary to patient's history of ulcerative colitis. Patient did respond very well to the injection and hopefully we'll continue to. Patient continues to have any pain we will consider gabapentin. Refill of tramadol given today. Patient was angulated better when he left. Patient come back in 3-4 weeks for further evaluation.

## 2015-07-10 NOTE — Progress Notes (Signed)
[facility] [address], [place] [address] Phone: [phone] Subjective:      CC: Right hip pain followup  XTG:GYIRSWNIOE Chris Stokes is a 81 y.o. male coming in with complaint of right hip pain. Patient was having pain mostly on the lateral aspect of his hip and we'll did have a greater trochanteric bursitis. Patient does have mild to moderate osteophytic changes of the hips bilaterally as well as some facet arthropathy in the lumbar spine. Patient was given exercises for the hip as well as for the back. We also discussed icing regimen as well as over-the-counter medications and tramadol for pain relief. Patient at last visit did have a ultrasound guided injection of the greater trochanteric bursa. Patient was last seen in October. Has been 10 months. Patient states over the course last couple weeks he is having worsening discomfort again. States some of it seems to be in the groin but most of it is on the lateral aspect. Some mild increase in back pain but he thinks is because he is compensating. Patient states that he feels more fatigued but he is also now wearing oxygen. States that he is angulate with the aid of a cane. States that it is waking him up at night when he rolls onto his right side.  Past medical history, social, surgical and family history all reviewed in electronic medical record.   Review of Systems: No headache, visual changes, nausea, vomiting, diarrhea, constipation, dizziness, abdominal pain, skin rash, fevers, chills, night sweats, weight loss, swollen lymph nodes, body aches, joint swelling, muscle aches, chest pain, shortness of breath, mood changes.   Objective Blood pressure 122/84, pulse 55, height 5\' 10"  (1.778 m), weight 191 lb (86.637 kg), SpO2 89 %.  General: No apparent distress alert and oriented x3 mood and affect normal, dressed appropriately.  HEENT: Pupils equal, extraocular movements intact  Respiratory: Patient's  speak in full sentences and does not appear short of breath  Cardiovascular: No lower extremity edema, non tender, no erythema  Skin: Warm dry intact with no signs of infection or rash on extremities or on axial skeleton.  Abdomen: Soft nontender  Neuro: Cranial nerves II through XII are intact, neurovascularly intact in all extremities with 2+ DTRs and 2+ pulses.  Lymph: No lymphadenopathy of posterior or anterior cervical chain or axillae bilaterally.  Gait normal with good balance and coordination.  MSK:  Non tender with full range of motion and good stability and symmetric strength and tone of shoulders, elbows, wrist,  knee and ankles bilaterally.  Back Exam:  Inspection: Unremarkable  Motion: Shows mild restriction in extension lacking the last 10 as well as last 5 of flexion and last 5 of rotation bilaterally. SLR laying: Negative  XSLR laying: Negative  Palpable tenderness: Tender to palpation over the paraspinal musculature mostly over L5 and S1.[name] FABER: Sore Sensory change: Gross sensation intact to all lumbar and sacral dermatomes.  Reflexes: 2+ at both patellar tendons, 2+ at achilles tendons, Babinski's downgoing.  Strength at foot  Plantar-flexion: 5/5 Dorsi-flexion: 5/5 Eversion: 5/5 Inversion: 5/5    Hip: Right ROM IR: 15 Deg with discomfort, ER: 25 Deg, Flexion: 120 Deg, Extension: 70 Deg, Abduction: 45 Deg, Adduction: 15 Deg Strength IR: 4/5, ER: 4/5, Flexion: 5/5, Extension: 4/5, Abduction: 4/5, Adduction: 5/5 Pelvic alignment unremarkable to inspection and palpation. Standing hip rotation and gait without trendelenburg sign / unsteadiness. Greater trochanter severe tenderness No tenderness over piriformis  Pain with both Corky Sox  and internal rotation Nontender over the back. Strength is symmetric compared to contralateral side    Procedure: Real-time Ultrasound Guided Injection of right greater trochanteric bursitis secondary to patient's body habitus Device:  GE Logiq E  Ultrasound guided injection is preferred based studies that show increased duration, increased effect, greater accuracy, decreased procedural pain, increased response rate, and decreased cost with ultrasound guided versus blind injection.  Verbal informed consent obtained.  Time-out conducted.  Noted no overlying erythema, induration, or other signs of local infection.  Skin prepped in a sterile fashion.  Local anesthesia: Topical Ethyl chloride.  With sterile technique and under real time ultrasound guidance:  Greater trochanteric area was visualized and patient's bursa was noted. A 22-gauge 3 inch needle was inserted and 4 cc of 0.5% Marcaine and 1 cc of Kenalog 40 mg/dL was injected. Pictures taken Completed without difficulty  Pain immediately resolved suggesting accurate placement of the medication.  Advised to call if fevers/chills, erythema, induration, drainage, or persistent bleeding.  Images permanently stored and available for review in the ultrasound unit.  Impression: Technically successful ultrasound guided injection.     Impression and Recommendations:     This case required medical decision making of moderate complexity.

## 2015-07-10 NOTE — Progress Notes (Signed)
Pre visit review using our clinic review tool, if applicable. No additional management support is needed unless otherwise documented below in the visit note. 

## 2015-07-10 NOTE — Patient Instructions (Signed)
Good to see you Ice that side of the hip Exercises 3 times a week.  pennsaid pinkie amount topically 2 times daily as needed.  Tramadol when you need it.  See me again in 4 weeks if not a lot betteror when you need me.

## 2015-07-17 ENCOUNTER — Ambulatory Visit: Payer: PPO | Admitting: Internal Medicine

## 2015-07-28 ENCOUNTER — Ambulatory Visit (INDEPENDENT_AMBULATORY_CARE_PROVIDER_SITE_OTHER): Payer: PPO | Admitting: Internal Medicine

## 2015-07-28 ENCOUNTER — Encounter: Payer: Self-pay | Admitting: Internal Medicine

## 2015-07-28 ENCOUNTER — Other Ambulatory Visit (INDEPENDENT_AMBULATORY_CARE_PROVIDER_SITE_OTHER): Payer: PPO

## 2015-07-28 VITALS — BP 120/78 | HR 91 | Temp 97.6°F | Wt 181.0 lb

## 2015-07-28 DIAGNOSIS — I10 Essential (primary) hypertension: Secondary | ICD-10-CM

## 2015-07-28 DIAGNOSIS — Z23 Encounter for immunization: Secondary | ICD-10-CM | POA: Diagnosis not present

## 2015-07-28 DIAGNOSIS — J84112 Idiopathic pulmonary fibrosis: Secondary | ICD-10-CM | POA: Diagnosis not present

## 2015-07-28 DIAGNOSIS — K5190 Ulcerative colitis, unspecified, without complications: Secondary | ICD-10-CM | POA: Diagnosis not present

## 2015-07-28 DIAGNOSIS — J9611 Chronic respiratory failure with hypoxia: Secondary | ICD-10-CM

## 2015-07-28 LAB — CBC WITH DIFFERENTIAL/PLATELET
BASOS PCT: 0.4 % (ref 0.0–3.0)
Basophils Absolute: 0 10*3/uL (ref 0.0–0.1)
EOS PCT: 2.1 % (ref 0.0–5.0)
Eosinophils Absolute: 0.2 10*3/uL (ref 0.0–0.7)
HCT: 47.3 % (ref 39.0–52.0)
Hemoglobin: 15.5 g/dL (ref 13.0–17.0)
LYMPHS ABS: 1.7 10*3/uL (ref 0.7–4.0)
Lymphocytes Relative: 19.7 % (ref 12.0–46.0)
MCHC: 32.9 g/dL (ref 30.0–36.0)
MCV: 103.1 fl — AB (ref 78.0–100.0)
MONO ABS: 0.9 10*3/uL (ref 0.1–1.0)
Monocytes Relative: 11.1 % (ref 3.0–12.0)
NEUTROS PCT: 66.7 % (ref 43.0–77.0)
Neutro Abs: 5.7 10*3/uL (ref 1.4–7.7)
Platelets: 159 10*3/uL (ref 150.0–400.0)
RBC: 4.59 Mil/uL (ref 4.22–5.81)
RDW: 13.6 % (ref 11.5–15.5)
WBC: 8.5 10*3/uL (ref 4.0–10.5)

## 2015-07-28 LAB — TSH: TSH: 34.35 u[IU]/mL — ABNORMAL HIGH (ref 0.35–4.50)

## 2015-07-28 LAB — HEPATIC FUNCTION PANEL
ALT: 28 U/L (ref 0–53)
AST: 24 U/L (ref 0–37)
Albumin: 4 g/dL (ref 3.5–5.2)
Alkaline Phosphatase: 59 U/L (ref 39–117)
BILIRUBIN DIRECT: 0.1 mg/dL (ref 0.0–0.3)
TOTAL PROTEIN: 8.2 g/dL (ref 6.0–8.3)
Total Bilirubin: 0.4 mg/dL (ref 0.2–1.2)

## 2015-07-28 LAB — BASIC METABOLIC PANEL
BUN: 27 mg/dL — ABNORMAL HIGH (ref 6–23)
CALCIUM: 10.4 mg/dL (ref 8.4–10.5)
CO2: 25 mEq/L (ref 19–32)
Chloride: 102 mEq/L (ref 96–112)
Creatinine, Ser: 1.44 mg/dL (ref 0.40–1.50)
GFR: 49.69 mL/min — AB (ref >60.00)
GLUCOSE: 104 mg/dL — AB (ref 70–99)
Potassium: 5 mEq/L (ref 3.5–5.1)
Sodium: 134 mEq/L — ABNORMAL LOW (ref 135–145)

## 2015-07-28 NOTE — Assessment & Plan Note (Signed)
Chronic Dr Henrene Pastor On Mesalamine - too $$$ Pt assistance program tel given [phone]

## 2015-07-28 NOTE — Assessment & Plan Note (Signed)
On Coreg, Losartan

## 2015-07-28 NOTE — Assessment & Plan Note (Signed)
Dr Halford Chessman On O2 at night

## 2015-07-28 NOTE — Assessment & Plan Note (Signed)
On O2 at night 

## 2015-07-28 NOTE — Patient Instructions (Signed)
[employer] patient assistance program tel is [phone]

## 2015-07-28 NOTE — Progress Notes (Signed)
Subjective:  Patient ID: Chris Stokes, male    DOB: [birth year]  Age: 81 y.o. MRN: [id number]  CC: No chief complaint on file.   HPI Saed Seivert presents for ILD on O2 at night, UC, HTN, hypothyroidism f/u. C/o As[place]ol too $$$  Outpatient Prescriptions Prior to Visit  Medication Sig Dispense Refill  . allopurinol (ZYLOPRIM) 100 MG tablet Take 2 tablets (200 mg total) by mouth daily. 180 tablet 3  . amLODipine (NORVASC) 5 MG tablet Take 1 tablet (5 mg total) by mouth daily. 90 tablet 3  . aspirin 81 MG tablet Take 81 mg by mouth daily.      . carvedilol (COREG) 25 MG tablet Take 0.5 tablets (12.5 mg total) by mouth 2 (two) times daily with a meal. 90 tablet 3  . Cholecalciferol (VITAMIN D3) 2000 UNITS TABS Take 1 tablet by mouth 2 (two) times daily.      [name] donepezil (ARICEPT) 10 MG tablet Take 1 tablet (10 mg total) by mouth at bedtime. 90 tablet 3  . Fexofenadine HCl (KLS ALLER-FEX PO) Take by mouth. 120 mg take one tablet by mouth daily     . folic acid (FOLVITE) 174 MCG tablet Take 800 mcg by mouth daily.      [name] gabapentin (NEURONTIN) 300 MG capsule Take 1 capsule (300 mg total) by mouth 3 (three) times daily. 270 capsule 3  . Garlic 9449 MG CAPS Take 1 capsule by mouth 2 (two) times daily.      [name] ipratropium (ATROVENT) 0.06 % nasal spray Place 2 sprays into the nose 4 (four) times daily as needed for rhinitis (runny nose). 45 mL 2  . levothyroxine (SYNTHROID) 137 MCG tablet Take 1 tablet (137 mcg total) by mouth daily before breakfast. 90 tablet 3  . losartan (COZAAR) 100 MG tablet Take 1 tablet (100 mg total) by mouth daily. 90 tablet 3  . Omega-3 Fatty Acids (FISH OIL) 1000 MG CAPS Take 1,000 mg by mouth daily.     [name] omeprazole (PRILOSEC) 20 MG capsule Take 20 mg by mouth daily as needed.      [name] OVER THE COUNTER MEDICATION Liquid Life 1 oz. daily    . primidone (MYSOLINE) 50 MG tablet Take 1 tablet (50 mg total) by mouth 3 (three) times daily. For tremor 270 tablet 3  . traMADol  (ULTRAM) 50 MG tablet Take 1-2 tablets (50-100 mg total) by mouth 2 (two) times daily as needed. 60 tablet 1  . Mesalamine (DELZICOL) 400 MG CPDR DR capsule Take 4 capsules (1,600 mg total) by mouth 3 (three) times daily. (Patient not taking: Reported on 07/28/2015) 1100 capsule 1   No facility-administered medications prior to visit.    ROS Review of Systems  Constitutional: Positive for fatigue. Negative for fever, appetite change and unexpected weight change.  HENT: Negative for congestion, nosebleeds, sneezing, sore throat and trouble swallowing.   Eyes: Negative for itching and visual disturbance.  Respiratory: Positive for shortness of breath. Negative for cough.   Cardiovascular: Negative for chest pain, palpitations and leg swelling.  Gastrointestinal: Negative for nausea, diarrhea, blood in stool and abdominal distention.  Genitourinary: Negative for frequency and hematuria.  Musculoskeletal: Positive for arthralgias and gait problem. Negative for back pain, joint swelling and neck pain.  Skin: Negative for rash.  Neurological: Positive for weakness. Negative for dizziness, tremors and speech difficulty.  Psychiatric/Behavioral: Positive for decreased concentration. Negative for suicidal ideas, sleep disturbance, dysphoric mood and agitation. The patient is not nervous/anxious.  R hip pain  Objective:  BP 120/78 mmHg  Pulse 91  Temp(Src) 97.6 F (36.4 C) (Oral)  Wt 181 lb (82.101 kg)  SpO2 97%  BP Readings from Last 3 Encounters:  07/30/15 94/62  07/28/15 120/78  07/10/15 122/84    Wt Readings from Last 3 Encounters:  07/30/15 185 lb (83.915 kg)  07/28/15 181 lb (82.101 kg)  07/10/15 191 lb (86.637 kg)    Physical Exam  Constitutional: He is oriented to person, place, and time. He appears well-developed. No distress.  NAD  HENT:  Mouth/Throat: Oropharynx is clear and moist.  Eyes: Conjunctivae are normal. Pupils are equal, round, and reactive to light.  Neck:  Normal range of motion. No JVD present. No thyromegaly present.  Cardiovascular: Normal rate, regular rhythm, normal heart sounds and intact distal pulses.  Exam reveals no gallop and no friction rub.   No murmur heard. Pulmonary/Chest: Effort normal and breath sounds normal. No respiratory distress. He has no wheezes. He has no rales. He exhibits no tenderness.  Abdominal: Soft. Bowel sounds are normal. He exhibits no distension and no mass. There is no tenderness. There is no rebound and no guarding.  Musculoskeletal: Normal range of motion. He exhibits tenderness. He exhibits no edema.  Lymphadenopathy:    He has no cervical adenopathy.  Neurological: He is alert and oriented to person, place, and time. He has normal reflexes. No cranial nerve deficit. He exhibits normal muscle tone. He displays a negative Romberg sign. Coordination and gait normal.  Skin: Skin is warm and dry. No rash noted.  Psychiatric: He has a normal mood and affect. His behavior is normal. Judgment and thought content normal.  R hip w/lat tenderness  Lab Results  Component Value Date   WBC 8.5 07/28/2015   HGB 15.5 07/28/2015   HCT 47.3 07/28/2015   PLT 159.0 07/28/2015   GLUCOSE 104* 07/28/2015   CHOL 252* 02/04/2015   TRIG 141.0 02/04/2015   HDL 33.40* 02/04/2015   LDLCALC 190* 02/04/2015   ALT 28 07/28/2015   AST 24 07/28/2015   NA 134* 07/28/2015   K 5.0 07/28/2015   CL 102 07/28/2015   CREATININE 1.44 07/28/2015   BUN 27* 07/28/2015   CO2 25 07/28/2015   TSH 34.35* 07/28/2015   PSA 1.91 11/09/2010    Dg Chest 2 View  06/04/2015   CLINICAL DATA:  Interstitial lung disease, chronic shortness of breath, routine checkup  EXAM: CHEST  2 VIEW  COMPARISON:  Chest x-ray of 06/03/2014  FINDINGS: Prominent coarse interstitial lung markings remain bilaterally. No definite active process is seen. However the only questionable abnormality is on the lateral view anteriorly where there is a more irregular opacity  present. Some of this opacity has been present since 2013, and this most likely represents progressive scarring. No abnormality is seen in this region on prior CT chest from 2013. If further assessment is warranted CT of the chest without IV contrast media would be recommended. No effusion is seen. Cardiomegaly is stable. Median sternotomy sutures are noted from CABG.  IMPRESSION: 1. Chronic interstitial lung disease.  No definite active process. 2. More irregular opacity anteriorly on the lateral view probably represents a focus of scarring as well, but consider CT of the chest without IV contrast media if further assessment is warranted.   Electronically Signed   By: Ivar Drape M.D.   On: 06/04/2015 11:06    Assessment & Plan:   Diagnoses and all orders for this visit:  Essential hypertension -     CBC with Differential/Platelet; Future -     Basic metabolic panel; Future -     Hepatic function panel; Future -     TSH; Future  IPF (idiopathic pulmonary fibrosis) -     CBC with Differential/Platelet; Future -     Basic metabolic panel; Future -     Hepatic function panel; Future -     TSH; Future  Chronic respiratory failure with hypoxia -     CBC with Differential/Platelet; Future -     Basic metabolic panel; Future -     Hepatic function panel; Future -     TSH; Future  Ulcerative colitis, without complications  Need for influenza vaccination -     Flu Vaccine QUAD 36+ mos IM  I am having Mr. Panos maintain his aspirin, Fish Oil, Vitamin D3, omeprazole, Fexofenadine HCl (KLS ALLER-FEX PO), Garlic, folic acid, ipratropium, OVER THE COUNTER MEDICATION, amLODipine, levothyroxine, donepezil, allopurinol, carvedilol, losartan, primidone, gabapentin, traMADol, and Mesalamine.  Meds ordered this encounter  Medications  . Mesalamine 800 MG TBEC    Sig: Take 2 tablets by mouth 2 (two) times daily.     Follow-up: Return in about 4 months (around 11/27/2015) for a follow-up  visit.  [name], MD

## 2015-07-30 ENCOUNTER — Encounter: Payer: Self-pay | Admitting: Internal Medicine

## 2015-07-30 ENCOUNTER — Encounter: Payer: Self-pay | Admitting: Family Medicine

## 2015-07-30 ENCOUNTER — Ambulatory Visit (INDEPENDENT_AMBULATORY_CARE_PROVIDER_SITE_OTHER): Payer: PPO | Admitting: Family Medicine

## 2015-07-30 VITALS — BP 94/62 | HR 57 | Ht 70.0 in | Wt 185.0 lb

## 2015-07-30 DIAGNOSIS — M5416 Radiculopathy, lumbar region: Secondary | ICD-10-CM | POA: Diagnosis not present

## 2015-07-30 NOTE — Assessment & Plan Note (Signed)
I do believe the patient has more of a lumbar radiculopathy. We discussed icing regimen, home exercises and we discussed possible formal physical therapy. Patient will try to make these different changes as well as come back in 4-6 weeks. Continued have pain we do need to readdress the lumbar region and would get x-rays as well as potential advance imaging to see if an epidural injection would be wanted. L5 nerve root impingement would be the most concerning.  Spent  25 minutes with patient face-to-face and had greater than 50% of counseling including as described above in assessment and plan.

## 2015-07-30 NOTE — Progress Notes (Signed)
  [facility] [address] [place], [place] [address] Phone: [phone] Subjective:      CC: Right hip pain followup  [id number] Chris Stokes is a 81 y.o. male coming in with complaint of right hip pain. Patient was having pain mostly on the lateral aspect of his hip and we'll did have a greater trochanteric bursitis. Patient at last visit did elect to have an injection on the lateral aspect of the hip. Patient was to do home exercises, icing protocol, and conservative therapy. 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 Patient states overall he seems to be doing relatively better. States that the severe pain center but has as more of a dull throbbing aching sensation on the lateral aspect of the leg that seems to go towards his knee. Only happens with certain range of motion. All the time. Not affecting all his daily activities but sometimes can be severe enough that he does need the pain medication. Denies any weakness. States that it can be associated with back pain.  Past medical history, social, surgical and family history all reviewed in electronic medical record.   Review of Systems: No headache, visual changes, nausea, vomiting, diarrhea, constipation, dizziness, abdominal pain, skin rash, fevers, chills, night sweats, weight loss, swollen lymph nodes, body aches, joint swelling, muscle aches, chest pain, shortness of breath, mood changes.   Objective Blood pressure 94/62, pulse 57, height 5\' 10"  (1.778 m), weight 185 lb (83.915 kg), SpO2 94 %.  General: No apparent distress alert and oriented x3 mood and affect normal, dressed appropriately.  HEENT: Pupils equal, extraocular movements intact  Respiratory: Patient's speak in full sentences and does not appear short of breath  Cardiovascular: No lower extremity edema, non tender, no erythema  Skin: Warm dry intact  with no signs of infection or rash on extremities or on axial skeleton.  Abdomen: Soft nontender  Neuro: Cranial nerves II through XII are intact, neurovascularly intact in all extremities with 2+ DTRs and 2+ pulses.  Lymph: No lymphadenopathy of posterior or anterior cervical chain or axillae bilaterally.  Gait normal with good balance and coordination.  MSK:  Non tender with full range of motion and good stability and symmetric strength and tone of shoulders, elbows, wrist,  knee and ankles bilaterally.  Back Exam:  Inspection: Unremarkable  Motion: Shows mild restriction in extension lacking the last 10 as well as last 5 of flexion and last 5 of rotation bilaterally. SLR laying: Negative  XSLR laying: Negative  Palpable tenderness: Tender to palpation over the paraspinal musculature mostly over L5 and S1.[name] FABER:  some stiffness as well as soreness of the right sacroiliac joint and the  lumbar spine Sensory change: Gross sensation intact to all lumbar and sacral dermatomes.  Reflexes: 2+ at both patellar tendons, 2+ at achilles tendons, Babinski's downgoing.  Strength at foot  Plantar-flexion: 5/5 Dorsi-flexion: 5/5 Eversion: 5/5 Inversion: 5/5  Continue mild discomfort with internal range of motion of the hip.       Impression and Recommendations:     This case required medical decision making of moderate complexity.

## 2015-07-30 NOTE — Patient Instructions (Signed)
Good to see you Ice 20 minutes 2 times daily. Usually after activity and before bed. Exercises 2-3 times a week even in bed.  I love the massager you are talking about Ibuprofen 400mg  2 times a day as needed Tylenol take 3 times a day scheduled Use tramadol when needed Continue the vitamins and the gabapentin  See me again in 6 weeks.

## 2015-07-30 NOTE — Progress Notes (Signed)
Pre visit review using our clinic review tool, if applicable. No additional management support is needed unless otherwise documented below in the visit note. 

## 2015-08-07 ENCOUNTER — Other Ambulatory Visit: Payer: Self-pay | Admitting: *Deleted

## 2015-08-07 DIAGNOSIS — E039 Hypothyroidism, unspecified: Secondary | ICD-10-CM

## 2015-08-08 ENCOUNTER — Other Ambulatory Visit (INDEPENDENT_AMBULATORY_CARE_PROVIDER_SITE_OTHER): Payer: PPO

## 2015-08-08 DIAGNOSIS — E039 Hypothyroidism, unspecified: Secondary | ICD-10-CM

## 2015-08-08 LAB — T4, FREE: Free T4: 0.85 ng/dL (ref 0.60–1.60)

## 2015-08-17 ENCOUNTER — Telehealth: Payer: Self-pay | Admitting: Pulmonary Disease

## 2015-08-17 DIAGNOSIS — J84112 Idiopathic pulmonary fibrosis: Secondary | ICD-10-CM

## 2015-08-17 NOTE — Telephone Encounter (Signed)
Chris Stokes with RA 08/03/15 >> test time 5 hrs 42 min.  Mean SpO2 89.9%, low SpO2 71%.  Spent 1 hr 14 min with SpO2 < 88%.   Will have my nurse inform pt that Chris Stokes shows his oxygen is also low at night while asleep.  He needs to use 2 liters oxygen at night while asleep, in addition to using 2 liters oxygen with exertion during the day.  Please send order so that his DME will set him up for nocturnal oxygen at 2 liters in addition to his current set up of 2 liters portable oxygen during the day.

## 2015-08-19 NOTE — Telephone Encounter (Signed)
Chris Stokes- Daughter Would like to know the results from [facility] [phone]

## 2015-08-19 NOTE — Telephone Encounter (Signed)
Called and spoke with pt's daughter  Informed daughter of pt's ONO results and rec of VS Daughter voiced understanding of rec  Order put in to begin pt on nocturnal O2 at 2L through [facility]  Nothing further is needed at this time

## 2015-09-23 ENCOUNTER — Encounter: Payer: Self-pay | Admitting: Pulmonary Disease

## 2015-10-23 ENCOUNTER — Emergency Department (HOSPITAL_COMMUNITY): Payer: PPO

## 2015-10-23 ENCOUNTER — Emergency Department (HOSPITAL_COMMUNITY)
Admission: EM | Admit: 2015-10-23 | Discharge: 2015-10-23 | Disposition: A | Discharge status: Home / Self Care | Payer: PPO | Attending: Emergency Medicine | Admitting: Emergency Medicine

## 2015-10-23 ENCOUNTER — Encounter (HOSPITAL_COMMUNITY): Payer: Self-pay | Admitting: Emergency Medicine

## 2015-10-23 DIAGNOSIS — Z9981 Dependence on supplemental oxygen: Secondary | ICD-10-CM | POA: Insufficient documentation

## 2015-10-23 DIAGNOSIS — J8410 Pulmonary fibrosis, unspecified: Secondary | ICD-10-CM | POA: Insufficient documentation

## 2015-10-23 DIAGNOSIS — Z951 Presence of aortocoronary bypass graft: Secondary | ICD-10-CM | POA: Diagnosis not present

## 2015-10-23 DIAGNOSIS — I129 Hypertensive chronic kidney disease with stage 1 through stage 4 chronic kidney disease, or unspecified chronic kidney disease: Secondary | ICD-10-CM | POA: Insufficient documentation

## 2015-10-23 DIAGNOSIS — Z7982 Long term (current) use of aspirin: Secondary | ICD-10-CM | POA: Diagnosis not present

## 2015-10-23 DIAGNOSIS — N189 Chronic kidney disease, unspecified: Secondary | ICD-10-CM | POA: Diagnosis not present

## 2015-10-23 DIAGNOSIS — W1811XA Fall from or off toilet without subsequent striking against object, initial encounter: Secondary | ICD-10-CM | POA: Diagnosis not present

## 2015-10-23 DIAGNOSIS — Z87891 Personal history of nicotine dependence: Secondary | ICD-10-CM | POA: Insufficient documentation

## 2015-10-23 DIAGNOSIS — Y9289 Other specified places as the place of occurrence of the external cause: Secondary | ICD-10-CM | POA: Diagnosis not present

## 2015-10-23 DIAGNOSIS — Z8669 Personal history of other diseases of the nervous system and sense organs: Secondary | ICD-10-CM | POA: Diagnosis not present

## 2015-10-23 DIAGNOSIS — I252 Old myocardial infarction: Secondary | ICD-10-CM | POA: Diagnosis not present

## 2015-10-23 DIAGNOSIS — S79922A Unspecified injury of left thigh, initial encounter: Secondary | ICD-10-CM | POA: Insufficient documentation

## 2015-10-23 DIAGNOSIS — E039 Hypothyroidism, unspecified: Secondary | ICD-10-CM | POA: Diagnosis not present

## 2015-10-23 DIAGNOSIS — K219 Gastro-esophageal reflux disease without esophagitis: Secondary | ICD-10-CM | POA: Diagnosis not present

## 2015-10-23 DIAGNOSIS — Y9389 Activity, other specified: Secondary | ICD-10-CM | POA: Insufficient documentation

## 2015-10-23 DIAGNOSIS — W19XXXA Unspecified fall, initial encounter: Secondary | ICD-10-CM

## 2015-10-23 DIAGNOSIS — Z87442 Personal history of urinary calculi: Secondary | ICD-10-CM | POA: Diagnosis not present

## 2015-10-23 DIAGNOSIS — S4991XA Unspecified injury of right shoulder and upper arm, initial encounter: Secondary | ICD-10-CM | POA: Insufficient documentation

## 2015-10-23 DIAGNOSIS — Z86010 Personal history of colonic polyps: Secondary | ICD-10-CM | POA: Insufficient documentation

## 2015-10-23 DIAGNOSIS — I2510 Atherosclerotic heart disease of native coronary artery without angina pectoris: Secondary | ICD-10-CM | POA: Diagnosis not present

## 2015-10-23 DIAGNOSIS — Y998 Other external cause status: Secondary | ICD-10-CM | POA: Insufficient documentation

## 2015-10-23 DIAGNOSIS — Z79899 Other long term (current) drug therapy: Secondary | ICD-10-CM | POA: Diagnosis not present

## 2015-10-23 DIAGNOSIS — E785 Hyperlipidemia, unspecified: Secondary | ICD-10-CM | POA: Diagnosis not present

## 2015-10-23 NOTE — ED Notes (Addendum)
Pt from home. Pt fell when get got up off to go to the toilet and his back hurt. No LOC, did not hit head. Pt reports he hit his head last night when he shuffled backwards. Pt unsure if he fell. Pt on home O2 intermittently at night. Per EMS pt low 80s on RA. Put pt on 3L [place]. Pt 81% on 3L upon arrival. Pt reports he only feels SOB when he talks. Pt speaking in full sentences with no difficulty. Bumped O2 to 5L and will re eval. Hx of chronic lower back pain worse after fall. Pt reports he has been stumbling since yesterday evening.

## 2015-10-23 NOTE — ED Notes (Signed)
Bed: [id number] Expected date:  Expected time:  Means of arrival:  Comments: Ems-elderly fall

## 2015-10-23 NOTE — Discharge Instructions (Signed)
As discussed, it is normal to feel worse in the days immediately following a fall regardless of medication use. ° °However, please take all medication as directed, use ice packs liberally.  If you develop any new, or concerning changes in your condition, please return here for further evaluation and management.   ° °Otherwise, please return followup with your physician ° ° ° °

## 2015-10-23 NOTE — ED Provider Notes (Addendum)
CSN: [id number]     Arrival date & time 10/23/15  1007 History   First MD Initiated Contact with Patient 10/23/15 1011     Chief Complaint  Patient presents with  . Fall     (Consider location/radiation/quality/duration/timing/severity/associated sxs/prior Treatment) HPI  Patient presents after a fall. It seems as though the fall was yesterday, and the patient has full recall of the event. Patient has baseline gait difficulty, and this was persistent, is persistent today. Seemingly, the patient had a mechanical fall backwards, no substantial head trauma, loss of consciousness. Since that time he has had worsening of his typical low back pain, otherwise persistency of his generalized weakness, with no new weakness anywhere, no new incontinence, no other new changes from baseline. No medication taken for pain relief, though they're worse with continued ambulation.   Past Medical History  Diagnosis Date  . CAD (coronary artery disease)     Dr. Mare Ferrari  . Hyperlipidemia   . Hypertension   . Hypothyroidism   . Ulcerative colitis     Dr Henrene Pastor  . Hiatal hernia   . History of heart attack   . Pulmonary fibrosis ([place])     new dx-couple weeks/ no O2  . Seasonal allergies   . Cataract 1999    bilateral cataracts   . Pulmonary fibrosis ([place]) 12/08/12  . GERD (gastroesophageal reflux disease) 2012  . Chronic kidney disease     multiple kidney stone flare ups  . Myocardial infarction ([place]) 1984  . Rectal fissure   . Colon polyps     adenomatous   Past Surgical History  Procedure Laterality Date  . Abdominal aortic aneurysm repair  1992  . Coronary artery bypass graft  2004    4 vessel bypass  . Colonoscopy    . Polypectomy     Family History  Problem Relation Age of Onset  . Colon cancer Cousin     mat side  . Lymphoma Sister   . Hypertension Other   . Hypertension Father   . Emphysema Father   . Emphysema Brother   . Arthritis/Rheumatoid Mother   . Hyperlipidemia  Father   . Hyperlipidemia Sister   . Hyperlipidemia Brother    Social History  Substance Use Topics  . Smoking status: Former Smoker -- 2.00 packs/day for 25 years    Types: Cigarettes    Quit date: 11/29/1966  . Smokeless tobacco: Never Used  . Alcohol Use: No    Review of Systems  Constitutional:       Per HPI, otherwise negative  HENT:       Per HPI, otherwise negative  Respiratory:       Pulm fibrosis, baseline O2 - dependency  Cardiovascular:       Per HPI, otherwise negative  Gastrointestinal: Negative for vomiting.  Endocrine:       Negative aside from HPI  Genitourinary:       Neg aside from HPI   Musculoskeletal:       Per HPI, otherwise negative  Skin: Negative.   Neurological: Negative for syncope.      Allergies  Lipitor; Ramipril; Simvastatin; and Sulfonamide derivatives  Home Medications   Prior to Admission medications   Medication Sig Start Date End Date Taking? Authorizing Provider  allopurinol (ZYLOPRIM) 100 MG tablet Take 2 tablets (200 mg total) by mouth daily. 02/04/15   Aleksei Plotnikov V, MD  amLODipine (NORVASC) 5 MG tablet Take 1 tablet (5 mg total) by mouth daily. 02/04/15  Aleksei Plotnikov V, MD  aspirin 81 MG tablet Take 81 mg by mouth daily.      Historical Provider, MD  carvedilol (COREG) 25 MG tablet Take 0.5 tablets (12.5 mg total) by mouth 2 (two) times daily with a meal. 02/04/15   Lew Dawes V, MD  Cholecalciferol (VITAMIN D3) 2000 UNITS TABS Take 1 tablet by mouth 2 (two) times daily.      Historical Provider, MD  donepezil (ARICEPT) 10 MG tablet Take 1 tablet (10 mg total) by mouth at bedtime. 02/04/15   Aleksei Plotnikov V, MD  Fexofenadine HCl (KLS ALLER-FEX PO) Take by mouth. 120 mg take one tablet by mouth daily     Historical Provider, MD  folic acid (FOLVITE) Q000111Q MCG tablet Take 800 mcg by mouth daily.      Historical Provider, MD  gabapentin (NEURONTIN) 300 MG capsule Take 1 capsule (300 mg total) by mouth 3 (three) times  daily. 02/04/15   Aleksei Plotnikov V, MD  Garlic [id number] MG CAPS Take 1 capsule by mouth 2 (two) times daily.      Historical Provider, MD  ipratropium (ATROVENT) 0.06 % nasal spray Place 2 sprays into the nose 4 (four) times daily as needed for rhinitis (runny nose). 02/15/13   Aleksei Plotnikov V, MD  levothyroxine (SYNTHROID) 137 MCG tablet Take 1 tablet (137 mcg total) by mouth daily before breakfast. 02/04/15   Lew Dawes V, MD  losartan (COZAAR) 100 MG tablet Take 1 tablet (100 mg total) by mouth daily. 02/04/15   Aleksei Plotnikov V, MD  Mesalamine 800 MG TBEC Take 2 tablets by mouth 2 (two) times daily.    Historical Provider, MD  Omega-3 Fatty Acids (FISH OIL) 1000 MG CAPS Take 1,000 mg by mouth daily.     Historical Provider, MD  omeprazole (PRILOSEC) 20 MG capsule Take 20 mg by mouth daily as needed.      Historical Provider, MD  OVER THE COUNTER MEDICATION Liquid Life 1 oz. daily    Historical Provider, MD  primidone (MYSOLINE) 50 MG tablet Take 1 tablet (50 mg total) by mouth 3 (three) times daily. For tremor 02/04/15   Aleksei Plotnikov V, MD  traMADol (ULTRAM) 50 MG tablet Take 1-2 tablets (50-100 mg total) by mouth 2 (two) times daily as needed. 07/10/15   Lyndal Pulley, DO   BP 127/99 mmHg  Pulse 90  Temp(Src) 96.9 F (36.1 C) (Axillary)  Resp 18  SpO2 92% Physical Exam  Constitutional: He is oriented to person, place, and time. He appears well-developed. No distress.  HENT:  Head: Normocephalic and atraumatic.  Eyes: Conjunctivae and EOM are normal.  Neck: No spinous process tenderness and no muscular tenderness present. No rigidity. No edema, no erythema and normal range of motion present.  Cardiovascular: Normal rate and regular rhythm.   Pulmonary/Chest: Effort normal. No stridor. No respiratory distress.  [place] in place- diminished breath sounds  Abdominal: He exhibits no distension.  Musculoskeletal: He exhibits no edema.       Right shoulder: He exhibits tenderness and  bony tenderness. He exhibits normal range of motion, no swelling, no effusion, no crepitus, no deformity, no laceration, no pain, no spasm and normal pulse.       Legs: Neurological: He is alert and oriented to person, place, and time.  Skin: Skin is warm and dry.  Psychiatric: He has a normal mood and affect.  Nursing note and vitals reviewed.   ED Course  Procedures (including critical care time)  On re-exam the patient is in no distress.  He and his daughter are aware of all results.  No new complaints, he has f/u w chiropractic in four days.  MDM  Patient presents after mechanical fall, with full recall of the event. Patient is neurologically intact, awake, alert, answer questions properly. Patient has baseline pulmonary fibrosis, is oxygen dependent, but has no evidence for systemic illness, that is new, nor fracture. Patient discharged in stable condition.   Carmin Muskrat, MD 10/23/15 1113  Carmin Muskrat, MD 11/06/15 1336

## 2015-10-23 NOTE — ED Notes (Signed)
MD at bedside. 

## 2015-10-28 ENCOUNTER — Encounter: Payer: Self-pay | Admitting: Internal Medicine

## 2015-10-28 ENCOUNTER — Ambulatory Visit (INDEPENDENT_AMBULATORY_CARE_PROVIDER_SITE_OTHER): Payer: PPO | Admitting: Internal Medicine

## 2015-10-28 VITALS — BP 130/70 | HR 54 | Wt 187.0 lb

## 2015-10-28 DIAGNOSIS — R269 Unspecified abnormalities of gait and mobility: Secondary | ICD-10-CM | POA: Insufficient documentation

## 2015-10-28 DIAGNOSIS — J84112 Idiopathic pulmonary fibrosis: Secondary | ICD-10-CM | POA: Diagnosis not present

## 2015-10-28 NOTE — Progress Notes (Signed)
Subjective:  Patient ID: Chris Stokes, male    DOB: [birth year]  Age: 81 y.o. MRN: [id number]  CC: No chief complaint on file.   HPI Chris Stokes presents for a fall on 11/24. ED note was reviewed. Dtrs stay w/pt and his wife overnight. Pt is on O2 now. Comes w/dtr.  Outpatient Prescriptions Prior to Visit  Medication Sig Dispense Refill  . allopurinol (ZYLOPRIM) 100 MG tablet Take 2 tablets (200 mg total) by mouth daily. (Patient taking differently: Take 100 mg by mouth daily. ) 180 tablet 3  . amLODipine (NORVASC) 5 MG tablet Take 1 tablet (5 mg total) by mouth daily. 90 tablet 3  . aspirin 81 MG tablet Take 81 mg by mouth daily.      . calcium carbonate (TUMS - DOSED IN MG ELEMENTAL CALCIUM) 500 MG chewable tablet Chew 1 tablet by mouth daily as needed for indigestion or heartburn.    . carvedilol (COREG) 25 MG tablet Take 0.5 tablets (12.5 mg total) by mouth 2 (two) times daily with a meal. 90 tablet 3  . Cholecalciferol (VITAMIN D3) 2000 UNITS TABS Take 1 tablet by mouth 2 (two) times daily.      [name] donepezil (ARICEPT) 10 MG tablet Take 1 tablet (10 mg total) by mouth at bedtime. 90 tablet 3  . Fexofenadine HCl (KLS ALLER-FEX PO) Take by mouth. 120 mg take one tablet by mouth daily     . folic acid (FOLVITE) Q000111Q MCG tablet Take 800 mcg by mouth daily.      [name] gabapentin (NEURONTIN) 300 MG capsule Take 1 capsule (300 mg total) by mouth 3 (three) times daily. (Patient taking differently: Take 300-600 mg by mouth 2 (two) times daily. 300 mg in the morning and 600 mg at night) 270 capsule 3  . Garlic [id number] MG CAPS Take 1 capsule by mouth 2 (two) times daily.      [name] ibuprofen (ADVIL,MOTRIN) 200 MG tablet Take 400 mg by mouth 2 (two) times daily as needed for moderate pain.    [name] ipratropium (ATROVENT) 0.06 % nasal spray Place 2 sprays into the nose 4 (four) times daily as needed for rhinitis (runny nose). 45 mL 2  . levothyroxine (SYNTHROID) 137 MCG tablet Take 1 tablet (137 mcg total) by mouth  daily before breakfast. 90 tablet 3  . losartan (COZAAR) 100 MG tablet Take 1 tablet (100 mg total) by mouth daily. 90 tablet 3  . mesalamine (ASACOL) 400 MG EC tablet Take 800 mg by mouth 2 (two) times daily.    . Omega-3 Fatty Acids (FISH OIL) 1000 MG CAPS Take 1,000 mg by mouth daily.     [name] omeprazole (PRILOSEC) 20 MG capsule Take 20 mg by mouth daily as needed (heartburn).     [name] OVER THE COUNTER MEDICATION Liquid Life 1 oz. daily    . primidone (MYSOLINE) 50 MG tablet Take 1 tablet (50 mg total) by mouth 3 (three) times daily. For tremor (Patient taking differently: Take 50-100 mg by mouth 2 (two) times daily. For tremor 50 mg in the morning and 100 mg at night) 270 tablet 3  . traMADol (ULTRAM) 50 MG tablet Take 1-2 tablets (50-100 mg total) by mouth 2 (two) times daily as needed. (Patient taking differently: Take 50-100 mg by mouth 2 (two) times daily as needed for moderate pain. ) 60 tablet 1   No facility-administered medications prior to visit.    ROS Review of Systems  Constitutional: Negative for appetite change, fatigue  and unexpected weight change.  HENT: Negative for congestion, nosebleeds, sneezing, sore throat and trouble swallowing.   Eyes: Negative for itching and visual disturbance.  Respiratory: Positive for shortness of breath. Negative for cough and wheezing.   Cardiovascular: Negative for chest pain, palpitations and leg swelling.  Gastrointestinal: Negative for nausea, diarrhea, blood in stool and abdominal distention.  Genitourinary: Negative for frequency and hematuria.  Musculoskeletal: Positive for back pain, arthralgias and gait problem. Negative for joint swelling and neck pain.  Skin: Negative for rash.  Neurological: Negative for dizziness, tremors, speech difficulty and weakness.  Psychiatric/Behavioral: Negative for sleep disturbance, dysphoric mood and agitation. The patient is not nervous/anxious.     Objective:  BP 130/70 mmHg  Pulse 54  Wt 187 lb  (84.823 kg)  SpO2 84%  BP Readings from Last 3 Encounters:  10/28/15 130/70  10/23/15 160/103  07/30/15 94/62    Wt Readings from Last 3 Encounters:  10/28/15 187 lb (84.823 kg)  07/30/15 185 lb (83.915 kg)  07/28/15 181 lb (82.101 kg)    Physical Exam  Constitutional: He is oriented to person, place, and time. He appears well-developed. No distress.  NAD  HENT:  Mouth/Throat: Oropharynx is clear and moist.  Eyes: Conjunctivae are normal. Pupils are equal, round, and reactive to light.  Neck: Normal range of motion. No JVD present. No thyromegaly present.  Cardiovascular: Normal rate, regular rhythm, normal heart sounds and intact distal pulses.  Exam reveals no gallop and no friction rub.   No murmur heard. Pulmonary/Chest: Effort normal and breath sounds normal. No respiratory distress. He has no wheezes. He has no rales. He exhibits no tenderness.  Abdominal: Soft. Bowel sounds are normal. He exhibits no distension and no mass. There is no tenderness. There is no rebound and no guarding.  Musculoskeletal: Normal range of motion. He exhibits no edema or tenderness.  Lymphadenopathy:    He has no cervical adenopathy.  Neurological: He is alert and oriented to person, place, and time. He has normal reflexes. No cranial nerve deficit. He exhibits normal muscle tone. He displays a negative Romberg sign. Coordination and gait normal.  Skin: Skin is warm and dry. No rash noted.  Psychiatric: He has a normal mood and affect. His behavior is normal. Judgment and thought content normal.  walker Ataxic LS tender O2   Lab Results  Component Value Date   WBC 8.5 07/28/2015   HGB 15.5 07/28/2015   HCT 47.3 07/28/2015   PLT 159.0 07/28/2015   GLUCOSE 104* 07/28/2015   CHOL 252* 02/04/2015   TRIG 141.0 02/04/2015   HDL 33.40* 02/04/2015   LDLCALC 190* 02/04/2015   ALT 28 07/28/2015   AST 24 07/28/2015   NA 134* 07/28/2015   K 5.0 07/28/2015   CL 102 07/28/2015   CREATININE  1.44 07/28/2015   BUN 27* 07/28/2015   CO2 25 07/28/2015   TSH 34.35* 07/28/2015   PSA 1.91 11/09/2010    Dg Pelvis 1-2 Views  10/23/2015  CLINICAL DATA:  False morning in bathroom with bilateral hip pain, initial encounter EXAM: PELVIS - 1-2 VIEW COMPARISON:  None. FINDINGS: Pelvic ring is intact. Degenerative changes of the hip joints are noted bilaterally. No acute fracture or dislocation is seen. No soft tissue abnormality is noted. IMPRESSION: No acute abnormality noted. Electronically Signed   By: Inez Catalina M.D.   On: 10/23/2015 10:57   Dg Shoulder Right  10/23/2015  CLINICAL DATA:  Fall in bathroom with right shoulder pain, initial  encounter EXAM: RIGHT SHOULDER - 2+ VIEW COMPARISON:  06/04/2015. FINDINGS: Mild degenerative changes of the acromioclavicular joint are seen. No acute fracture or dislocation is noted. Patchy interstitial changes are noted throughout the right lung stable from the previous exam. IMPRESSION: No acute abnormality noted. Electronically Signed   By: Inez Catalina M.D.   On: 10/23/2015 10:58    Assessment & Plan:   Diagnoses and all orders for this visit:  IPF (idiopathic pulmonary fibrosis) ([place]) -     Home Health  Gait disorder -     Home Health -     Face-to-face encounter (required for Medicare/Medicaid patients)  I am having Mr. Neenan maintain his aspirin, Fish Oil, Vitamin D3, omeprazole, Fexofenadine HCl (KLS ALLER-FEX PO), Garlic, folic acid, ipratropium, OVER THE COUNTER MEDICATION, amLODipine, levothyroxine, donepezil, allopurinol, carvedilol, losartan, primidone, gabapentin, traMADol, mesalamine, ibuprofen, and calcium carbonate.  No orders of the defined types were placed in this encounter.     Follow-up: Return in about 3 months (around 01/27/2016) for a follow-up visit.  [name], MD

## 2015-10-28 NOTE — Assessment & Plan Note (Signed)
Multifactorial  PT at home 

## 2015-10-28 NOTE — Assessment & Plan Note (Signed)
Dr Sood On O2 at night 

## 2015-10-28 NOTE — Progress Notes (Signed)
Pre visit review using our clinic review tool, if applicable. No additional management support is needed unless otherwise documented below in the visit note. 

## 2015-11-18 ENCOUNTER — Telehealth: Payer: Self-pay | Admitting: Internal Medicine

## 2015-11-18 NOTE — Telephone Encounter (Signed)
Pt sister called in and said that there was suppose to a   Best number [phone]

## 2015-11-25 ENCOUNTER — Ambulatory Visit: Payer: PPO | Admitting: Internal Medicine

## 2015-12-04 ENCOUNTER — Other Ambulatory Visit (INDEPENDENT_AMBULATORY_CARE_PROVIDER_SITE_OTHER): Payer: PPO

## 2015-12-04 ENCOUNTER — Encounter: Payer: Self-pay | Admitting: Pulmonary Disease

## 2015-12-04 ENCOUNTER — Ambulatory Visit (INDEPENDENT_AMBULATORY_CARE_PROVIDER_SITE_OTHER): Payer: PPO | Admitting: Pulmonary Disease

## 2015-12-04 VITALS — BP 142/80 | HR 91 | Ht 70.0 in | Wt 187.0 lb

## 2015-12-04 DIAGNOSIS — J8410 Pulmonary fibrosis, unspecified: Secondary | ICD-10-CM

## 2015-12-04 LAB — COMPREHENSIVE METABOLIC PANEL
ALT: 11 U/L (ref 0–53)
AST: 16 U/L (ref 0–37)
Albumin: 3.9 g/dL (ref 3.5–5.2)
Alkaline Phosphatase: 90 U/L (ref 39–117)
BILIRUBIN TOTAL: 0.4 mg/dL (ref 0.2–1.2)
BUN: 16 mg/dL (ref 6–23)
CO2: 29 meq/L (ref 19–32)
CREATININE: 0.99 mg/dL (ref 0.40–1.50)
Calcium: 10.1 mg/dL (ref 8.4–10.5)
Chloride: 104 mEq/L (ref 96–112)
GFR: 76.5 mL/min (ref >60.00)
GLUCOSE: 101 mg/dL — AB (ref 70–99)
Potassium: 3.5 mEq/L (ref 3.5–5.1)
Sodium: 141 mEq/L (ref 135–145)
TOTAL PROTEIN: 8.3 g/dL (ref 6.0–8.3)

## 2015-12-04 LAB — CBC
HCT: 42.9 % (ref 39.0–52.0)
Hemoglobin: 14.2 g/dL (ref 13.0–17.0)
MCHC: 33 g/dL (ref 30.0–36.0)
MCV: 102.3 fl — AB (ref 78.0–100.0)
Platelets: 149 10*3/uL — ABNORMAL LOW (ref 150.0–400.0)
RBC: 4.2 Mil/uL — ABNORMAL LOW (ref 4.22–5.81)
RDW: 14.2 % (ref 11.5–15.5)
WBC: 8.1 10*3/uL (ref 4.0–10.5)

## 2015-12-04 MED ORDER — NINTEDANIB ESYLATE 150 MG PO CAPS: 150.0000 mg | ORAL_CAPSULE | Freq: Two times a day (BID) | ORAL | Status: DC

## 2015-12-04 NOTE — Progress Notes (Signed)
Current Outpatient Prescriptions on File Prior to Visit  Medication Sig  . allopurinol (ZYLOPRIM) 100 MG tablet Take 2 tablets (200 mg total) by mouth daily. (Patient taking differently: Take 100 mg by mouth daily. )  . amLODipine (NORVASC) 5 MG tablet Take 1 tablet (5 mg total) by mouth daily.  [name] aspirin 81 MG tablet Take 81 mg by mouth daily.    . calcium carbonate (TUMS - DOSED IN MG ELEMENTAL CALCIUM) 500 MG chewable tablet Chew 1 tablet by mouth daily as needed for indigestion or heartburn.  . carvedilol (COREG) 25 MG tablet Take 0.5 tablets (12.5 mg total) by mouth 2 (two) times daily with a meal.  . Cholecalciferol (VITAMIN D3) 2000 UNITS TABS Take 1 tablet by mouth 2 (two) times daily.    [name] donepezil (ARICEPT) 10 MG tablet Take 1 tablet (10 mg total) by mouth at bedtime.  [name] Fexofenadine HCl (KLS ALLER-FEX PO) Take by mouth. 120 mg take one tablet by mouth daily   . folic acid (FOLVITE) Q000111Q MCG tablet Take 800 mcg by mouth daily.    [name] gabapentin (NEURONTIN) 300 MG capsule Take 1 capsule (300 mg total) by mouth 3 (three) times daily. (Patient taking differently: Take 300-600 mg by mouth 2 (two) times daily. 300 mg in the morning and 600 mg at night)  . Garlic [id number] MG CAPS Take 1 capsule by mouth 2 (two) times daily.    [name] ibuprofen (ADVIL,MOTRIN) 200 MG tablet Take 400 mg by mouth 2 (two) times daily as needed for moderate pain.  [name] ipratropium (ATROVENT) 0.06 % nasal spray Place 2 sprays into the nose 4 (four) times daily as needed for rhinitis (runny nose).  [name] levothyroxine (SYNTHROID) 137 MCG tablet Take 1 tablet (137 mcg total) by mouth daily before breakfast.  . losartan (COZAAR) 100 MG tablet Take 1 tablet (100 mg total) by mouth daily.  . mesalamine (ASACOL) 400 MG EC tablet Take 800 mg by mouth 2 (two) times daily.  . Omega-3 Fatty Acids (FISH OIL) 1000 MG CAPS Take 1,000 mg by mouth daily.   [name] omeprazole (PRILOSEC) 20 MG capsule Take 20 mg by mouth daily as needed (heartburn).   [name]  OVER THE COUNTER MEDICATION Liquid Life 1 oz. daily  . primidone (MYSOLINE) 50 MG tablet Take 1 tablet (50 mg total) by mouth 3 (three) times daily. For tremor (Patient taking differently: Take 50-100 mg by mouth 2 (two) times daily. For tremor 50 mg in the morning and 100 mg at night)  . traMADol (ULTRAM) 50 MG tablet Take 1-2 tablets (50-100 mg total) by mouth 2 (two) times daily as needed. (Patient taking differently: Take 50-100 mg by mouth 2 (two) times daily as needed for moderate pain. )   No current facility-administered medications on file prior to visit.     Chief Complaint  Patient presents with  . Follow-up    pt. states breathing has worsen. increased SOB. dry cough. wheezing. denies chest pain/tightness but has a ache under lt. rib. pt. states he fall twice in nov. while not wearing 02. on 2L 02.      Tests CT chest 11/10/12 >> centrilobular and paraseptal emphysema, patchy GGO, sub pleural reticulation, honeycombing, traction BTX, basilar predominate Labs 12/01/12 >> RNP Ab, SCL 70, Anti Jo, ANA, RF, anti CCP negative PFT 06/27/13 >> FEV1 2.59 (92%), FEV1% 80, TLC 4.50 (63%), DLCO 35% PFT 07/02/14 >> FEV1 2.46 (89%), FEV1% 77, TLC 5.02 (70%), DLCO 32% PFT 06/04/15 >> FEV1  2.17 (80%), FEV1% 79, TLC 4.21 (59%), DLCO 25% Ambulatory oximetry 06/04/15 >> SpO2 86%, start 2 liters oyxgen ONO with RA 08/03/15 >> test time 5 hrs 42 min. Mean SpO2 89.9%, low SpO2 71%. Spent 1 hr 14 min with SpO2 < 88%.   Past medical hx CAD, HTN, HLD, Hypothyroidism, UC, HH, GERD, CKD, Allergies  Past surgical hx, Allergies, Family hx, Social hx all reviewed.  Vital Signs BP 142/80 mmHg  Pulse 91  Ht 5\' 10"  (1.778 m)  Wt 187 lb (84.823 kg)  BMI 26.83 kg/m2  SpO2 64%  History of Present Illness Chris Stokes is a 82 y.o. male former smoker with IPF.  His breathing has been getting worse.  He is having intermittent cough.  He denies fever, or leg swelling.  He has not had trouble with diarrhea  recently.  Physical Exam  General - No distress ENT - No sinus tenderness, no oral exudate, no LAN Cardiac - s1s2 regular, no murmur Chest - basilar crackles b/l Back - No focal tenderness Abd - Soft, non-tender Ext - No edema Neuro - Normal strength Skin - No rashes Psych - normal mood, and behavior   Assessment/Plan  Idiopathic pulmonary fibrosis. Plan: - lab tests today - will start process to see if he can get Ofev  Chronic respiratory failure with hypoxia. Plan: - continue 2 liters oxygen   Patient Instructions  Lab work today  Will start process to get approval for Ofev  Follow up in 6 to 8 weeks with Dr. Halford Chessman or Tammy Parrett     Chesley Mires, MD [place] [facility] Pager:  [phone]

## 2015-12-04 NOTE — Patient Instructions (Signed)
Lab work today  Will start process to get approval for Ofev  Follow up in 6 to 8 weeks with Dr. Halford Chessman or [place]

## 2015-12-10 ENCOUNTER — Telehealth: Payer: Self-pay | Admitting: Pulmonary Disease

## 2015-12-10 NOTE — Telephone Encounter (Signed)
CMP Latest Ref Rng 12/04/2015 07/28/2015 02/04/2015  Glucose 70 - 99 mg/dL 101(H) 104(H) 93  BUN 6 - 23 mg/dL 16 27(H) 17  Creatinine 0.40 - 1.50 mg/dL 0.99 1.44 1.09  Sodium 135 - 145 mEq/L 141 134(L) 138  Potassium 3.5 - 5.1 mEq/L 3.5 5.0 4.4  Chloride 96 - 112 mEq/L 104 102 106  CO2 19 - 32 mEq/L 29 25 29   Calcium 8.4 - 10.5 mg/dL 10.1 10.4 10.0  Total Protein 6.0 - 8.3 g/dL 8.3 8.2 8.0  Total Bilirubin 0.2 - 1.2 mg/dL 0.4 0.4 0.4  Alkaline Phos 39 - 117 U/L 90 59 61  AST 0 - 37 U/L 16 24 22   ALT 0 - 53 U/L 11 28 21     CBC Latest Ref Rng 12/04/2015 07/28/2015 10/22/2014  WBC 4.0 - 10.5 K/uL 8.1 8.5 7.2  Hemoglobin 13.0 - 17.0 g/dL 14.2 15.5 14.7  Hematocrit 39.0 - 52.0 % 42.9 47.3 44.3  Platelets 150.0 - 400.0 K/uL 149.0(L) 159.0 132.0(L)     Will have my nurse inform pt that lab tests were okay.  Will call back once we hear if he has approval for Ofev.

## 2015-12-11 ENCOUNTER — Telehealth: Payer: Self-pay | Admitting: Internal Medicine

## 2015-12-11 DIAGNOSIS — N320 Bladder-neck obstruction: Secondary | ICD-10-CM

## 2015-12-11 NOTE — Telephone Encounter (Signed)
[name], patient's daughter, returned Lindsay's call, CB [phone].

## 2015-12-11 NOTE — Telephone Encounter (Signed)
OFEV application faxed yesterday about [place] (daughter) - aware of results. Aware that [facility] app faxed and we will let them know as soon as we hear something.  Will hold in my box until determination received.

## 2015-12-11 NOTE — Telephone Encounter (Signed)
lmtcb x1 for pt. 

## 2015-12-11 NOTE — Telephone Encounter (Signed)
pts daughter, [name] called stating he needs a referral to urology. She thought Dr. Camila Li said a Dr. Francesca Jewett can be reached at [phone]

## 2015-12-12 NOTE — Telephone Encounter (Signed)
Advised lynn that urology referral has been placed

## 2015-12-12 NOTE — Telephone Encounter (Signed)
Ok Thx 

## 2015-12-13 DIAGNOSIS — J9611 Chronic respiratory failure with hypoxia: Secondary | ICD-10-CM | POA: Diagnosis not present

## 2015-12-13 DIAGNOSIS — J84112 Idiopathic pulmonary fibrosis: Secondary | ICD-10-CM | POA: Diagnosis not present

## 2015-12-13 DIAGNOSIS — J849 Interstitial pulmonary disease, unspecified: Secondary | ICD-10-CM | POA: Diagnosis not present

## 2015-12-18 NOTE — Telephone Encounter (Signed)
Form received from [employer] for Coverage Determination - completed by Dr Halford Chessman.  Faxed back to [phone] Will hold until we receive confirmation.

## 2015-12-22 ENCOUNTER — Telehealth: Payer: Self-pay | Admitting: Pulmonary Disease

## 2015-12-22 NOTE — Telephone Encounter (Signed)
Called spoke with lynn. Advised her it looks like pt was Dx back in 2013. She needed nothing further

## 2015-12-22 NOTE — Telephone Encounter (Signed)
Still awaiting full coverage of medication determination.  Letter received stating that the patient was approved for the 15-day Bridge Supply - 15-day supply shipped and was supposed to arrive Friday 12/19/15 Will hold and follow up on coverage

## 2015-12-23 NOTE — Telephone Encounter (Signed)
lmtcb x1 for pt's daughter. 

## 2015-12-23 NOTE — Telephone Encounter (Signed)
Pt's daughter is aware that pt has been approved for Ofev. Nothing further was needed.

## 2015-12-23 NOTE — Telephone Encounter (Signed)
[facility] is calling about his ofev and has it been approved [phone] sharon

## 2015-12-25 ENCOUNTER — Telehealth: Payer: Self-pay | Admitting: Pulmonary Disease

## 2015-12-25 NOTE — Telephone Encounter (Signed)
LMTCB for [employer]

## 2015-12-26 NOTE — Telephone Encounter (Addendum)
Spoke with Chris Stokes, states that she faxed a PA form to our office.  Aware that this has not been received. Chris Stokes states that she is going to fax this again and it needs to be filled out by Dr Halford Chessman and clinical questions need to be answered. [email or web address] KEY [id number] Will hold in triage until information received via fax.

## 2015-12-29 ENCOUNTER — Telehealth: Payer: Self-pay | Admitting: Pulmonary Disease

## 2015-12-29 NOTE — Telephone Encounter (Signed)
Message closed in error - see telephone note 12/29/15

## 2015-12-29 NOTE — Telephone Encounter (Signed)
Spoke with Ivin Booty, states that she faxed a PA form to our office.  Aware that this has not been received. Ivin Booty states that she is going to fax this again and it needs to be filled out by Dr Halford Chessman and clinical questions need to be answered.  [email or web address]  KEY [id number]  Will hold in triage until information received via fax.

## 2015-12-30 NOTE — Telephone Encounter (Signed)
Spoke with Ivin Booty at [facility], aware that we did not yet receive PA fax.  I advised that we do have the CMN key provided yesterday, but that we need to fax supporting documentation with this med's PA and need the fax copy of the form.  Will await fax.

## 2015-12-31 NOTE — Telephone Encounter (Signed)
Chris Stokes, have you received any forms yet?

## 2015-12-31 NOTE — Telephone Encounter (Signed)
Nothing as of yet today, will need to call tomorrow if nothing received this afternoon.

## 2016-01-01 NOTE — Telephone Encounter (Signed)
Coverage determination form signed and faxed back to [phone] Will await decision.

## 2016-01-05 ENCOUNTER — Other Ambulatory Visit: Payer: Self-pay | Admitting: Internal Medicine

## 2016-01-05 NOTE — Telephone Encounter (Signed)
Chris Stokes has this form been received? Thanks.

## 2016-01-05 NOTE — Telephone Encounter (Signed)
I have faxed this several times to [employer] at [phone] I have faxed this again.

## 2016-01-05 NOTE — Telephone Encounter (Signed)
Return call from [place] and she can be reached @ same # said that she faxed over hard copy of form on 2/2 and was just wanting to know if it was received.Chris Stokes

## 2016-01-05 NOTE — Telephone Encounter (Signed)
Still awaiting determination.

## 2016-01-06 NOTE — Telephone Encounter (Signed)
Will await response

## 2016-01-07 NOTE — Telephone Encounter (Signed)
Ivin Booty from [employer] returned call and states need to call PA dept [phone].

## 2016-01-07 NOTE — Telephone Encounter (Signed)
Called to follow up as I was not in the office on 01/06/16 LM for Chris Stokes x 1 @  [phone]

## 2016-01-08 ENCOUNTER — Telehealth: Payer: Self-pay | Admitting: Pulmonary Disease

## 2016-01-08 DIAGNOSIS — J84112 Idiopathic pulmonary fibrosis: Secondary | ICD-10-CM

## 2016-01-08 NOTE — Telephone Encounter (Signed)
Okay to change to esbriet.  Please inform pt about this, and that his insurance is dictating care options.

## 2016-01-08 NOTE — Telephone Encounter (Signed)
Esbriet 267 mg capsules >> 1 capsule TID for 7 days, then 2 capsules tid for 7 days, then 3 capsules tid.  Dispense enough for 1 month with 5 refills.

## 2016-01-08 NOTE — Telephone Encounter (Signed)
Spoke with OFEV Rep, Clarene Critchley, regarding the patient's coverage of OFEV. Per Clarene Critchley the patient is currently taking OFEV and tolerating well; was given 15-day Bridge of OFEV by [facility] while awaiting coverage of OFEV. I shared the process with Clarene Critchley and she is going to contact her Liaison to find out how to get this medication approved. On 12/23/15 the patient was approved per Ivin Booty at [facility] and then on 12/29/15 we received a call from [place] again stating that the medication needed PA. We completed the PA and faxed this back and received a denial letter on 01/05/16 from [employer] - letter stated that it is a non-formulary denial and that the patient needs to Try/Fail Esbriet before approved OR an Appeal Letter can be written to try and get approval of this drug. Per notes below, Dr Halford Chessman opted out of witting the Appeal Letter and wanted the patient to go ahead with Esbriet.   Once talking to Dr Halford Chessman he stated that he prefers the patient to be on OFEV and if able to get this approved, that would be better. If not able to get approved he was okay with the patient taking Esbriet in its place. Dr Halford Chessman is aware that Clarene Critchley is going to contact us in the next 1-2 business days with a determination and we will go from there.  Per Clarene Critchley the patient is tolerating the OFEV well and if it comes to having to write the Appeal letter his chances of getting approved are a little better.   Will await call back

## 2016-01-08 NOTE — Telephone Encounter (Signed)
Chris Stokes has been denied per the fax we received from [facility]. Per the fax we received, pt has to try and fail Esbriet before his insurance will cover Ofev.  VS - would you like to appeal Ofev or switch pt to Esbriet? Thanks.

## 2016-01-08 NOTE — Telephone Encounter (Signed)
How do you want to prescribe this medication? Thanks.

## 2016-01-08 NOTE — Telephone Encounter (Signed)
Spoke with pt's daughter. She is confused about what medication the pt should be on. Back in January we were told the Ofev was approved. Now were are being told that it's not approved. I have left a message with Ivin Booty at [facility] to figure what's going on.

## 2016-01-08 NOTE — Telephone Encounter (Signed)
Pt is requesting a referral to Pulmonary rehab. Please advise. Thanks.

## 2016-01-08 NOTE — Telephone Encounter (Signed)
I have placed order for pulmonary rehab referral.

## 2016-01-08 NOTE — Telephone Encounter (Signed)
Per triage protocol, will send back to triage to follow up on. Form from [employer] in [place] look at. Thanks.

## 2016-01-09 NOTE — Telephone Encounter (Signed)
Pt has [facility] rx  They need additional info on the esbriet was tried and failed because its the preferred drug or a statemant or why it cant be tried this was never approved it denied on 2/4 it was cancelled   She called the payor on this this was Ivin Booty from [place] there was never an [place] approved in January

## 2016-01-09 NOTE — Telephone Encounter (Signed)
Spoke with [employer] with [employer] (OFEV payor/Rep), states that he is going to come by the office on Monday to speak with me regarding this patient and how to get him approved. Harmon Pier states that the patient has one more 15-day supply of the OFEV (bridge program) that is planned to be shipped out next week, so the patient is covered while we are trying to get approval. Will hold in my box to follow up on Monday. Patient is aware of the everything going on and that we are trying to get him approved - as discussed yesterday with Clarene Critchley.

## 2016-01-09 NOTE — Telephone Encounter (Signed)
Daughter aware the order was sent

## 2016-01-09 NOTE — Telephone Encounter (Signed)
lmomtcb x1 for pt 

## 2016-01-12 ENCOUNTER — Encounter: Payer: Self-pay | Admitting: Pulmonary Disease

## 2016-01-12 NOTE — Telephone Encounter (Signed)
I have written letter, and it is in my office.

## 2016-01-12 NOTE — Telephone Encounter (Signed)
lmtcb x1 for [place]. Need a fax number where we can fax this letter to.

## 2016-01-12 NOTE — Telephone Encounter (Signed)
[place] with [employer] came by office to discuss appeal letter options.  Apparently, patient's insurance company is wanting patient to try Esbriet instead of OFEV.  Patient has been taking OFEV for over a month now without any events from side effects.  Per Harmon Pier, options for appeal letter to get OFEV approved are as follows:  1) Dosing Simplicity:  Esbriet is a titrated medication where patient would have to take 3 pills one day, 2 pills the next day, etc... Whereas, OFEV is 1 pill twice daily.  Since patient is an elderly patient, it would be easier for patient to remember how to take the [place].  2) Tolerating OFEV: Patient has been on a bridge therapy program for 1 month now taking OFEV and has tolerated the medication very well without any adverse reactions.  3) Studies in Acute IPF, COPD, Emphysema patients:  The risk of first acute IPF exacerbations over 52 weeks was significantly reduced in patient's receiving OFEV.  Per Harmon Pier, this information can be placed in the appeal letter and it should be approved.  Harmon Pier can be reached at: [phone]

## 2016-01-12 NOTE — Telephone Encounter (Signed)
Spoke with [employer]. States that the fax number where the letter needs to be faxed is on the denial that Chris Stokes had. I have look in triage and VS look at, I can't find this letter. Will have to wait until Chris Stokes returns to the office. Letter will be placed in VS look at.

## 2016-01-13 DIAGNOSIS — J84112 Idiopathic pulmonary fibrosis: Secondary | ICD-10-CM | POA: Diagnosis not present

## 2016-01-13 DIAGNOSIS — J9611 Chronic respiratory failure with hypoxia: Secondary | ICD-10-CM | POA: Diagnosis not present

## 2016-01-13 DIAGNOSIS — J849 Interstitial pulmonary disease, unspecified: Secondary | ICD-10-CM | POA: Diagnosis not present

## 2016-01-13 NOTE — Telephone Encounter (Signed)
[place] [phone] aware that Appeal Letter was received and has been denied.  Per Harmon Pier the only other option will be to do the Peer-to-Peer Pt has already been pre-approved for the [employer] for when/if the OFEV is completely denied of OFEV coverage.  [place] states that once denial letter is received then they can use the this letter to get him approved for full coverage. Will await final denial letter.  Pt has his final shipment of Bridge Therapy this week which will last him 2 more weeks.   Dr Halford Chessman please advise if would be willing to do Peer-to-Peer.

## 2016-01-13 NOTE — Telephone Encounter (Signed)
Appeal letter faxed to [phone] Will hold in triage to follow up on

## 2016-01-13 NOTE — Telephone Encounter (Signed)
Larene Beach from [facility] calling stating that oveb has been denied she can be reached @ [phone] ext [phone].Hillery Hunter

## 2016-01-14 NOTE — Telephone Encounter (Addendum)
Called Harmon Pier, states that the patient may be approved for [employer] which is being held as a back up for final denial of of insurance coverage.  Per Harmon Pier may benefit to do the Peer-to-Peer as soon as possible. Should be able to set up a phone conference to get this done. If Peer-to-Peer fails then hold denial letter and use that to help with getting Open Doors Approved(if needed).  Per Harmon Pier, keep ALL denial letters, this may help in getting him the final approval for [facility] [place].   I will speak with Dr Halford Chessman regarding a good date/time to set up Peer-to-Peer and call and set up this appt if able.

## 2016-01-14 NOTE — Telephone Encounter (Signed)
I can try peer to peer, but this things never work.  I need to have an appointment scheduled with them for peer to peer.  I can not spend limitless amounts of time waiting on the phone for them.

## 2016-01-14 NOTE — Telephone Encounter (Signed)
No number for peer to peer to be done. Chris Stokes, have you received the final denial that may have this number on it?

## 2016-01-14 NOTE — Telephone Encounter (Signed)
Per Dr Halford Chessman if able to set up a Peer-to-Peer please do this either 01/15/16 late afternoon or Friday 01/16/16 at 12pm (appts slots held for this).   I have called [employer](pt insurance to try and set up Peer-to-Peer) - Spoke with Denton Ar, states that Appeal Letter has NOT been denied and is still in "open status/pending".  Of course Peer-to-Peer is not needed at this time. Can call daily to follow up and check status @ [phone].   Called [facility], spoke with Larene Beach @ [phone] ext [phone] to make aware of this, apologizes for the inconvenience and misinformation, aware that Appeal is still in pending status. Larene Beach is not sure where the information came from that the Appeal was denied other than it was because the letter was uploaded in the system yet. Larene Beach is able to view appeal letter and see now that it is pending and states that she is going to help follow up on this daily and will follow up with our office by 01/19/16 to let us know of approved or denied. Appeals once received will take up to 72hours to receive approval or denial.  Will hold in my box to follow up. In meantime if we receive a Rx for refill of OFEV from [facility], refill this and they should continue to give bridge supply.

## 2016-01-15 NOTE — Telephone Encounter (Signed)
[place] [employer] [phone]

## 2016-01-15 NOTE — Telephone Encounter (Signed)
Called [phone] and spoke with Denton Ar and it is still in open status.

## 2016-01-16 NOTE — Telephone Encounter (Signed)
Landon Southern called back for an update- advised that pt is getting his ofev through bridge shipment, but still waiting on status of appeal letter. Landon requesting we keep him updated once we hear about pt's appeal status.   I advised we would- appeal letter sent on 2/14 so we should hear something today.

## 2016-01-16 NOTE — Telephone Encounter (Signed)
[facility] calling states the PA is approved 01/15/16 to 11/28/16 Larene Beach   [phone] ext [phone]

## 2016-01-16 NOTE — Telephone Encounter (Signed)
Spoke with Larene Beach at [place], [employer] PA has been approved 01/15/16-11/28/16.  PA #: [id number] Larene Beach states she's spoken with pt and spouse to make aware.   Spoke with Jesse Sans to make aware of approval also.  Nothing further needed at this time.

## 2016-01-16 NOTE — Telephone Encounter (Signed)
atc [employer], connection was spotty so line was disconnected. atc back, no answer, lmtcb.  Will await call back.

## 2016-01-19 ENCOUNTER — Ambulatory Visit (INDEPENDENT_AMBULATORY_CARE_PROVIDER_SITE_OTHER): Payer: PPO | Admitting: Cardiology

## 2016-01-19 ENCOUNTER — Encounter: Payer: Self-pay | Admitting: Cardiology

## 2016-01-19 VITALS — BP 120/72 | HR 56 | Ht 70.5 in | Wt 177.8 lb

## 2016-01-19 DIAGNOSIS — R0609 Other forms of dyspnea: Secondary | ICD-10-CM | POA: Diagnosis not present

## 2016-01-19 DIAGNOSIS — I259 Chronic ischemic heart disease, unspecified: Secondary | ICD-10-CM

## 2016-01-19 DIAGNOSIS — E7800 Pure hypercholesterolemia, unspecified: Secondary | ICD-10-CM | POA: Diagnosis not present

## 2016-01-19 NOTE — Patient Instructions (Signed)
Medication Instructions:  Your physician recommends that you continue on your current medications as directed. Please refer to the Current Medication list given to you today.  Labwork: none  Testing/Procedures: none  Follow-Up: Your physician wants you to follow-up in: 6 month ov with Dr Ellyn Hack at the [facility] office  You will receive a reminder letter in the mail two months in advance. If you don't receive a letter, please call our office to schedule the follow-up appointment.  If you need a refill on your cardiac medications before your next appointment, please call your pharmacy.

## 2016-01-19 NOTE — Progress Notes (Addendum)
Cardiology Office Note   Date:  01/19/2016   ID:  Chris Stokes, DOB 12/09/1928, MRN [id number]  PCP:  [name], MD  Cardiologist: Darlin Coco MD  No chief complaint on file.     History of Present Illness: Chris Stokes is a 82 y.o. male who presents for follow-up office visit  He does have a history of known ischemic heart disease. He had a previous myocardial infarction and in 2004 he underwent coronary artery bypass graft surgery. In February 2008 he had an nuclear stress test which showed an old inferolateral scar but no reversible ischemia. His ejection fraction was 40%. His echocardiogram in the past has shown impaired relaxation. He does not have any significant valvular heart disease by echo. He has been diagnosed with idiopathic pulmonary fibrosis. There is also a past history of asbestos exposure. He is followed by Dr. Halford Chessman. He has exertional dyspnea climbing 2 flights of stairs. From the cardiac standpoint he has been stable. He has not been experiencing any chest pain or angina. He has not been aware of any racing or skipping of his heart.  He has significant exertional dyspnea.  He is dyspneic at walking less than 1 city block.  He has home oxygen.  He sleeps on one to 2 pillows.  He has not been having any peripheral edema. He has a history of chronic colitis. He is on new medication for his pulmonary fibrosis called OFEV.  It is very expensive but he has received some type of grant to help cover the cost. The main potential side effect of the new medication is diarrhea but fortunately it has not caused his pre-existing colitis to get any worse. His appetite is less.  He has lost 10 pounds since we last saw him.  Past Medical History  Diagnosis Date  . CAD (coronary artery disease)     Dr. Mare Ferrari  . Hyperlipidemia   . Hypertension   . Hypothyroidism   . Ulcerative colitis     Dr Henrene Pastor  . Hiatal hernia   . History of heart attack   . Pulmonary fibrosis  ([place])     new dx-couple weeks/ no O2  . Seasonal allergies   . Cataract 1999    bilateral cataracts   . Pulmonary fibrosis ([place]) 12/08/12  . GERD (gastroesophageal reflux disease) 2012  . Chronic kidney disease     multiple kidney stone flare ups  . Myocardial infarction ([place]) 1984  . Rectal fissure   . Colon polyps     adenomatous    Past Surgical History  Procedure Laterality Date  . Abdominal aortic aneurysm repair  1992  . Coronary artery bypass graft  2004    4 vessel bypass  . Colonoscopy    . Polypectomy       Current Outpatient Prescriptions  Medication Sig Dispense Refill  . allopurinol (ZYLOPRIM) 100 MG tablet Take 100 mg by mouth 2 (two) times daily.    [name] amLODipine (NORVASC) 5 MG tablet Take 1 tablet (5 mg total) by mouth daily. 90 tablet 3  . ASACOL HD 800 MG TBEC TAKE TWO TABLETS BY MOUTH THREE TIMES DAILY 180 tablet 0  . aspirin 81 MG tablet Take 81 mg by mouth daily.      . calcium carbonate (TUMS - DOSED IN MG ELEMENTAL CALCIUM) 500 MG chewable tablet Chew 1 tablet by mouth daily as needed for indigestion or heartburn.    . carvedilol (COREG) 25 MG tablet Take 0.5 tablets (  12.5 mg total) by mouth 2 (two) times daily with a meal. 90 tablet 3  . Cholecalciferol (VITAMIN D3) 2000 UNITS TABS Take 1 tablet by mouth 2 (two) times daily.      [name] donepezil (ARICEPT) 10 MG tablet Take 1 tablet (10 mg total) by mouth at bedtime. 90 tablet 3  . Fexofenadine HCl (KLS ALLER-FEX PO) Take by mouth. 120 mg take one tablet by mouth daily     . folic acid (FOLVITE) Q000111Q MCG tablet Take 800 mcg by mouth daily.      [name] gabapentin (NEURONTIN) 300 MG capsule Take 300 mg by mouth every morning. Take 600mg  by mouth every evening    . Garlic [id number] MG CAPS Take 1 capsule by mouth 2 (two) times daily.      [name] ibuprofen (ADVIL,MOTRIN) 200 MG tablet Take 400 mg by mouth 2 (two) times daily as needed for moderate pain.    [name] ipratropium (ATROVENT) 0.06 % nasal spray Place 2 sprays into the nose 4  (four) times daily as needed for rhinitis (runny nose). 45 mL 2  . levothyroxine (SYNTHROID) 137 MCG tablet Take 1 tablet (137 mcg total) by mouth daily before breakfast. 90 tablet 3  . losartan (COZAAR) 100 MG tablet Take 1 tablet (100 mg total) by mouth daily. 90 tablet 3  . mesalamine (ASACOL) 400 MG EC tablet Take 800 mg by mouth 2 (two) times daily.    . Nintedanib (OFEV) 150 MG CAPS Take 150 mg by mouth 2 (two) times daily. 60 capsule   . Omega-3 Fatty Acids (FISH OIL) 1000 MG CAPS Take 1,000 mg by mouth daily.     [name] omeprazole (PRILOSEC) 20 MG capsule Take 20 mg by mouth daily as needed (heartburn).     [name] OVER THE COUNTER MEDICATION Liquid Life 1 oz. daily    . primidone (MYSOLINE) 50 MG tablet Take 50 mg by mouth every morning. Take 100mg  by mouth every evening    . traMADol (ULTRAM) 50 MG tablet Take 50-100 mg by mouth every 12 (twelve) hours as needed for moderate pain or severe pain.     No current facility-administered medications for this visit.    Allergies:   Lipitor; Ramipril; Simvastatin; and Sulfonamide derivatives    Social History:  The patient  reports that he quit smoking about 49 years ago. His smoking use included Cigarettes. He has a 50 pack-year smoking history. He has never used smokeless tobacco. He reports that he does not drink alcohol or use illicit drugs.   Family History:  The patient's family history includes Arthritis/Rheumatoid in his mother; Colon cancer in his cousin; Emphysema in his brother and father; Hyperlipidemia in his brother, father, and sister; Hypertension in his father and other; Lymphoma in his sister.    ROS:  Please see the history of present illness.   Otherwise, review of systems are positive for none.   All other systems are reviewed and negative.    PHYSICAL EXAM: VS:  BP 120/72 mmHg  Pulse 56  Ht 5' 10.5" (1.791 m)  Wt 177 lb 12.8 oz (80.65 kg)  BMI 25.14 kg/m2 , BMI Body mass index is 25.14 kg/(m^2). GEN: Well nourished, well  developed, in no acute distress HEENT: normal Neck: no JVD, carotid bruits, or masses Cardiac: RRR; no murmurs, rubs, or gallops,no edema  Respiratory:Dry crackling inspiratory pulmonary rales bilateral, normal work of breathing GI: soft, nontender, nondistended, + BS MS: no deformity or atrophy Skin: warm and dry, no rash  Neuro:  Strength and sensation are intact Psych: euthymic mood, full affect   EKG:  EKG is ordered today. The ekg ordered today demonstrates sinus bradycardia with sinus arrhythmia and first-degree AV block with heart rate of 54 bpm.  There is cold to touch criteria for LVH.  There is left axis deviation.  There is evidence of a old inferior wall myocardial infarction as well as poor R-wave progression across the lateral precordium.   Recent Labs: 07/28/2015: TSH 34.35* 12/04/2015: ALT 11; BUN 16; Creatinine, Ser 0.99; Hemoglobin 14.2; Platelets 149.0*; Potassium 3.5; Sodium 141    Lipid Panel    Component Value Date/Time   CHOL 252* 02/04/2015 0935   TRIG 141.0 02/04/2015 0935   HDL 33.40* 02/04/2015 0935   CHOLHDL 8 02/04/2015 0935   VLDL 28.2 02/04/2015 0935   LDLCALC 190* 02/04/2015 0935      Wt Readings from Last 3 Encounters:  01/19/16 177 lb 12.8 oz (80.65 kg)  12/04/15 187 lb (84.823 kg)  10/28/15 187 lb (84.823 kg)         ASSESSMENT AND PLAN:  1.  . Ischemic heart disease status post old inferior wall myocardial infarction and status post CABG in 2008. No recent angina pectoris. His last Myoview stress test on 10/10/12 showed an old inferior wall scar but no reversible ischemia and his ejection fraction was improved at 53%.  Continue ARB and carvedilol. 2. Pulmonary fibrosis followed by Dr. Halford Chessman 3. Hypothyroidism, on Synthroid, followed by Dr. Alain Marion 4. History of ulcerative colitis 5.  History of gout, on allopurinol 6.  History of peripheral neuropathy, on gabapentin 300 mg in the morning and 600 mg in the evening 7.  Intention  tremor improved on primidone    Current medicines are reviewed at length with the patient today.  The patient does not have concerns regarding medicines.  The following changes have been made:  no change  Labs/ tests ordered today include:   Orders Placed This Encounter  Procedures  . EKG 12-Lead     Disposition:   Continue current medication.  Recheck in 6 months for follow-up office visit with Dr. Ellyn Hack at [facility], Darlin Coco MD 01/19/2016 11:[address], [place], [place]  [address] Phone: [phone]; Fax: [phone]

## 2016-01-20 ENCOUNTER — Telehealth (HOSPITAL_COMMUNITY): Payer: Self-pay

## 2016-01-22 ENCOUNTER — Other Ambulatory Visit (INDEPENDENT_AMBULATORY_CARE_PROVIDER_SITE_OTHER): Payer: PPO

## 2016-01-22 ENCOUNTER — Encounter: Payer: Self-pay | Admitting: Adult Health

## 2016-01-22 ENCOUNTER — Ambulatory Visit (INDEPENDENT_AMBULATORY_CARE_PROVIDER_SITE_OTHER): Payer: PPO | Admitting: Adult Health

## 2016-01-22 VITALS — BP 128/62 | HR 60 | Temp 97.6°F | Ht 70.0 in | Wt 178.0 lb

## 2016-01-22 DIAGNOSIS — J84112 Idiopathic pulmonary fibrosis: Secondary | ICD-10-CM

## 2016-01-22 DIAGNOSIS — J9611 Chronic respiratory failure with hypoxia: Secondary | ICD-10-CM | POA: Diagnosis not present

## 2016-01-22 LAB — HEPATIC FUNCTION PANEL
ALBUMIN: 4 g/dL (ref 3.5–5.2)
ALK PHOS: 69 U/L (ref 39–117)
ALT: 17 U/L (ref 0–53)
AST: 20 U/L (ref 0–37)
Bilirubin, Direct: 0.1 mg/dL (ref 0.0–0.3)
TOTAL PROTEIN: 8.5 g/dL — AB (ref 6.0–8.3)
Total Bilirubin: 0.5 mg/dL (ref 0.2–1.2)

## 2016-01-22 NOTE — Progress Notes (Signed)
Quick Note:  Attempted to call pt. Unable to LVM due to no VM set up. ______

## 2016-01-22 NOTE — Assessment & Plan Note (Addendum)
Stable on therapy , will need to monitor GI side effects of OFEV Check labs today with LFT  Discussed pulmonary rehab -pt is considering beginning this.   Plan  Continue on OFEV.  Labs today .  Order to home care to evaluate for light weight O2 system/POC.  Call back if weight continues to go down , develop diarrhea or decreased appetite .  Follow up with Dr. Halford Chessman  In 6-8 weeks and  As needed

## 2016-01-22 NOTE — Patient Instructions (Signed)
Continue on OFEV.  Labs today .  Order to home care to evaluate for light weight O2 system/POC.  Call back if weight continues to go down , develop diarrhea or decreased appetite .  Follow up with Dr. Halford Chessman  In 6-8 weeks and  As needed

## 2016-01-22 NOTE — Progress Notes (Addendum)
Subjective:    Patient ID: Chris Stokes, male    DOB: 28-Oct-1929, 82 y.o.   MRN: [id number]  HPI 82 yo male with IPF   Tests CT chest 11/10/12 >> centrilobular and paraseptal emphysema, patchy GGO, sub pleural reticulation, honeycombing, traction BTX, basilar predominate Labs 12/01/12 >> RNP Ab, SCL 70, Anti Jo, ANA, RF, anti CCP negative PFT 06/27/13 >> FEV1 2.59 (92%), FEV1% 80, TLC 4.50 (63%), DLCO 35% PFT 07/02/14 >> FEV1 2.46 (89%), FEV1% 77, TLC 5.02 (70%), DLCO 32% PFT 06/04/15 >> FEV1 2.17 (80%), FEV1% 79, TLC 4.21 (59%), DLCO 25% Ambulatory oximetry 06/04/15 >> SpO2 86%, start 2 liters oyxgen ONO with RA 08/03/15 >> test time 5 hrs 42 min. Mean SpO2 89.9%, low SpO2 71%. Spent 1 hr 14 min with SpO2 < 88%.   01/22/2016 Follow up : IPF  Pt returns for 6 week follow up .  He has underlying IPF with progressive DOE .  He was started on OFEV last ov.  He says he is doing  [place]  On this .  Has had a few episodes of loose but says this is getting better. Does not feel colitis has flared. No bloody stools.  Appetite seems down some. Wt is down 10 lbs.  Pt is suppose to be on O2 with act and bedtime. Came to office again without oxygen  We discussed the complications of persistent hypoxia.  Says his tank is too heavy. We discussed DME evaluation for POC or light weight tank.  He denies N/v/d, chest pain, hemoptyiss , fever or orthopnea.  Has daily dry cough . Gets winded with activity . Better with rest.   Past Medical History  Diagnosis Date  . CAD (coronary artery disease)     Dr. Mare Ferrari  . Hyperlipidemia   . Hypertension   . Hypothyroidism   . Ulcerative colitis     Dr Henrene Pastor  . Hiatal hernia   . History of heart attack   . Pulmonary fibrosis ([place])     new dx-couple weeks/ no O2  . Seasonal allergies   . Cataract 1999    bilateral cataracts   . Pulmonary fibrosis ([place]) 12/08/12  . GERD (gastroesophageal reflux disease) 2012  . Chronic kidney disease     multiple kidney  stone flare ups  . Myocardial infarction ([place]) 1984  . Rectal fissure   . Colon polyps     adenomatous   Current Outpatient Prescriptions on File Prior to Visit  Medication Sig Dispense Refill  . allopurinol (ZYLOPRIM) 100 MG tablet Take 100 mg by mouth 2 (two) times daily.    [name] amLODipine (NORVASC) 5 MG tablet Take 1 tablet (5 mg total) by mouth daily. 90 tablet 3  . ASACOL HD 800 MG TBEC TAKE TWO TABLETS BY MOUTH THREE TIMES DAILY 180 tablet 0  . aspirin 81 MG tablet Take 81 mg by mouth daily.      . calcium carbonate (TUMS - DOSED IN MG ELEMENTAL CALCIUM) 500 MG chewable tablet Chew 1 tablet by mouth daily as needed for indigestion or heartburn.    . carvedilol (COREG) 25 MG tablet Take 0.5 tablets (12.5 mg total) by mouth 2 (two) times daily with a meal. 90 tablet 3  . Cholecalciferol (VITAMIN D3) 2000 UNITS TABS Take 1 tablet by mouth 2 (two) times daily.      [name] donepezil (ARICEPT) 10 MG tablet Take 1 tablet (10 mg total) by mouth at bedtime. 90 tablet 3  . Fexofenadine HCl (  KLS ALLER-FEX PO) Take by mouth. 120 mg take one tablet by mouth daily     . folic acid (FOLVITE) Q000111Q MCG tablet Take 800 mcg by mouth daily.      [name] gabapentin (NEURONTIN) 300 MG capsule Take 300 mg by mouth every morning. Take 600mg  by mouth every evening    . Garlic [id number] MG CAPS Take 1 capsule by mouth 2 (two) times daily.      [name] ibuprofen (ADVIL,MOTRIN) 200 MG tablet Take 400 mg by mouth 2 (two) times daily as needed for moderate pain.    [name] ipratropium (ATROVENT) 0.06 % nasal spray Place 2 sprays into the nose 4 (four) times daily as needed for rhinitis (runny nose). 45 mL 2  . levothyroxine (SYNTHROID) 137 MCG tablet Take 1 tablet (137 mcg total) by mouth daily before breakfast. 90 tablet 3  . losartan (COZAAR) 100 MG tablet Take 1 tablet (100 mg total) by mouth daily. 90 tablet 3  . mesalamine (ASACOL) 400 MG EC tablet Take 800 mg by mouth 2 (two) times daily.    . Nintedanib (OFEV) 150 MG CAPS Take 150 mg by  mouth 2 (two) times daily. 60 capsule   . Omega-3 Fatty Acids (FISH OIL) 1000 MG CAPS Take 1,000 mg by mouth daily.     [name] omeprazole (PRILOSEC) 20 MG capsule Take 20 mg by mouth daily as needed (heartburn).     [name] OVER THE COUNTER MEDICATION Liquid Life 1 oz. daily    . primidone (MYSOLINE) 50 MG tablet Take 50 mg by mouth every morning. Take 100mg  by mouth every evening    . traMADol (ULTRAM) 50 MG tablet Take 50-100 mg by mouth every 12 (twelve) hours as needed for moderate pain or severe pain.     No current facility-administered medications on file prior to visit.      Review of Systems Constitutional:   No  weight loss, night sweats,  Fevers, chills,  +fatigue, or  lassitude.  HEENT:   No headaches,  Difficulty swallowing,  Tooth/dental problems, or  Sore throat,                No sneezing, itching, ear ache, nasal congestion, post nasal drip,   CV:  No chest pain,  Orthopnea, PND, swelling in lower extremities, anasarca, dizziness, palpitations, syncope.   GI  No heartburn, indigestion, abdominal pain, nausea, vomiting, diarrhea, change in bowel habits, loss of appetite, bloody stools.   Resp: No shortness of breath with exertion or at rest.  No excess mucus, no productive cough,  No non-productive cough,  No coughing up of blood.  No change in color of mucus.  No wheezing.  No chest wall deformity  Skin: no rash or lesions.  GU: no dysuria, change in color of urine, no urgency or frequency.  No flank pain, no hematuria   MS:  No joint pain or swelling.  No decreased range of motion.  No back pain.  Psych:  No change in mood or affect. No depression or anxiety.  No memory loss.         Objective:   Physical Exam Filed Vitals:   01/22/16 1126  BP: 128/62  Pulse: 60  Temp: 97.6 F (36.4 C)  TempSrc: Oral  Height: 5\' 10"  (1.778 m)  Weight: 178 lb (80.74 kg)  SpO2: 90%   GEN: A/Ox3; pleasant , NAD, elderly on o2   HEENT:  [place]/AT,  EACs-clear, TMs-wnl, NOSE-clear,  THROAT-clear, no lesions, no postnasal drip  or exudate noted.   NECK:  Supple w/ fair ROM; no JVD; normal carotid impulses w/o bruits; no thyromegaly or nodules palpated; no lymphadenopathy.  RESP Faint BB crackles , no accessory muscle use, no dullness to percussion  CARD:  RRR, no m/r/g  , tr  peripheral edema, pulses intact, no cyanosis or clubbing.  GI:   Soft & nt; nml bowel sounds; no organomegaly or masses detected.  Musco: Warm bil, no deformities or joint swelling noted.   Neuro: alert, no focal deficits noted.    Skin: Warm, no lesions or rashes         Assessment & Plan:

## 2016-01-22 NOTE — Assessment & Plan Note (Signed)
Needs to wear o2 with act and At bedtime   Plan  Order to home care to evaluate for light weight O2 system/POC.  Call back if weight continues to go down , develop diarrhea or decreased appetite .  Follow up with Dr. Halford Chessman  In 6-8 weeks and  As needed

## 2016-01-22 NOTE — Progress Notes (Signed)
Reviewed and agree with assessment/plan. 

## 2016-01-22 NOTE — Addendum Note (Signed)
Addended by: Osa Craver on: 01/22/2016 12:09 PM   Modules accepted: Orders

## 2016-01-23 NOTE — Progress Notes (Signed)
Quick Note:  Called and spoke with the pt. Reviewed results and recs. Pt voiced understanding and had no further questions. ______ 

## 2016-01-27 ENCOUNTER — Encounter: Payer: Self-pay | Admitting: Internal Medicine

## 2016-01-27 ENCOUNTER — Ambulatory Visit: Payer: PPO | Admitting: Internal Medicine

## 2016-01-27 ENCOUNTER — Ambulatory Visit (INDEPENDENT_AMBULATORY_CARE_PROVIDER_SITE_OTHER): Payer: PPO | Admitting: Internal Medicine

## 2016-01-27 VITALS — BP 124/78 | HR 66 | Ht 70.0 in | Wt 178.0 lb

## 2016-01-27 DIAGNOSIS — I10 Essential (primary) hypertension: Secondary | ICD-10-CM | POA: Diagnosis not present

## 2016-01-27 DIAGNOSIS — E039 Hypothyroidism, unspecified: Secondary | ICD-10-CM | POA: Diagnosis not present

## 2016-01-27 DIAGNOSIS — M5441 Lumbago with sciatica, right side: Secondary | ICD-10-CM

## 2016-01-27 DIAGNOSIS — Z0000 Encounter for general adult medical examination without abnormal findings: Secondary | ICD-10-CM | POA: Insufficient documentation

## 2016-01-27 DIAGNOSIS — M7061 Trochanteric bursitis, right hip: Secondary | ICD-10-CM

## 2016-01-27 DIAGNOSIS — J84112 Idiopathic pulmonary fibrosis: Secondary | ICD-10-CM | POA: Diagnosis not present

## 2016-01-27 DIAGNOSIS — J9611 Chronic respiratory failure with hypoxia: Secondary | ICD-10-CM | POA: Diagnosis not present

## 2016-01-27 DIAGNOSIS — K5180 Other ulcerative colitis without complications: Secondary | ICD-10-CM

## 2016-01-27 NOTE — Assessment & Plan Note (Signed)
Here for medicare wellness/physical  Diet: heart healthy  Physical activity: sedentary  Depression/mood screen: negative  Hearing: decreased to whispered voice  Visual acuity: grossly normal, performs annual eye exam  ADLs: capable  Fall risk: moderate; on O2 Home safety: good  Cognitive evaluation: intact to orientation, naming, recall and repetition  EOL planning: adv directives, full code/ I agree  I have personally reviewed and have noted  1. The patient's medical, surgical and social history  2. Their use of alcohol, tobacco or illicit drugs  3. Their current medications and supplements  4. The patient's functional ability including ADL's, fall risks, home safety risks and hearing or visual impairment.  5. Diet and physical activities  6. Evidence for depression or mood disorders 7. The roster of all physicians providing medical care to patient - is listed in the Snapshot section of the chart and reviewed today.    Today patient counseled on age appropriate routine health concerns for screening and prevention, each reviewed and up to date or declined. Immunizations reviewed and up to date or declined. Labs ordered and reviewed. Risk factors for depression reviewed and negative. Hearing function and visual acuity are intact. ADLs screened and addressed as needed. Functional ability and level of safety reviewed and appropriate. Education, counseling and referrals performed based on assessed risks today. Patient provided with a copy of personalized plan for preventive services.

## 2016-01-27 NOTE — Patient Instructions (Signed)

## 2016-01-27 NOTE — Assessment & Plan Note (Signed)
On O2, on OFEV

## 2016-01-27 NOTE — Assessment & Plan Note (Signed)
Pain on the R resolved after a fall he had

## 2016-01-27 NOTE — Assessment & Plan Note (Signed)
Chronic On Coreg, Losartan

## 2016-01-27 NOTE — Assessment & Plan Note (Signed)
On Levothroid 

## 2016-01-27 NOTE — Assessment & Plan Note (Signed)
Chronic Dr Henrene Pastor On Mesalamine

## 2016-01-27 NOTE — Assessment & Plan Note (Signed)
On O2 [place] - needs a humidifier

## 2016-01-27 NOTE — Progress Notes (Signed)
Subjective:  Patient ID: Chris Stokes, male    DOB: 1929/08/31  Age: 82 y.o. MRN: [id number]  CC: No chief complaint on file.   HPI Chris Stokes presents for IPF, HTN, OA f/u The patient is here for a wellness exam. On O2 per [place]. C/o dryness in the nose  Outpatient Prescriptions Prior to Visit  Medication Sig Dispense Refill  . allopurinol (ZYLOPRIM) 100 MG tablet Take 100 mg by mouth 2 (two) times daily.    [name] amLODipine (NORVASC) 5 MG tablet Take 1 tablet (5 mg total) by mouth daily. 90 tablet 3  . ASACOL HD 800 MG TBEC TAKE TWO TABLETS BY MOUTH THREE TIMES DAILY 180 tablet 0  . aspirin 81 MG tablet Take 81 mg by mouth daily.      . carvedilol (COREG) 25 MG tablet Take 0.5 tablets (12.5 mg total) by mouth 2 (two) times daily with a meal. 90 tablet 3  . Cholecalciferol (VITAMIN D3) 2000 UNITS TABS Take 1 tablet by mouth 2 (two) times daily.      [name] donepezil (ARICEPT) 10 MG tablet Take 1 tablet (10 mg total) by mouth at bedtime. 90 tablet 3  . Fexofenadine HCl (KLS ALLER-FEX PO) Take by mouth. 120 mg take one tablet by mouth daily     . folic acid (FOLVITE) [id number] MCG tablet Take 800 mcg by mouth daily.      [name] gabapentin (NEURONTIN) 300 MG capsule Take 300 mg by mouth every morning. Take 600mg  by mouth every evening    . levothyroxine (SYNTHROID) 137 MCG tablet Take 1 tablet (137 mcg total) by mouth daily before breakfast. 90 tablet 3  . losartan (COZAAR) 100 MG tablet Take 1 tablet (100 mg total) by mouth daily. 90 tablet 3  . Nintedanib (OFEV) 150 MG CAPS Take 150 mg by mouth 2 (two) times daily. 60 capsule   . Omega-3 Fatty Acids (FISH OIL) 1000 MG CAPS Take 1,000 mg by mouth daily.     [name] OVER THE COUNTER MEDICATION Liquid Life 1 oz. daily    . primidone (MYSOLINE) 50 MG tablet Take 50 mg by mouth every morning. Take 100mg  by mouth every evening    . traMADol (ULTRAM) 50 MG tablet Take 50-100 mg by mouth every 12 (twelve) hours as needed for moderate pain or severe pain.    . calcium  carbonate (TUMS - DOSED IN MG ELEMENTAL CALCIUM) 500 MG chewable tablet Chew 1 tablet by mouth daily as needed for indigestion or heartburn. Reported on 01/27/2016    . Garlic [id number] MG CAPS Take 1 capsule by mouth 2 (two) times daily. Reported on 01/27/2016    . ibuprofen (ADVIL,MOTRIN) 200 MG tablet Take 400 mg by mouth 2 (two) times daily as needed for moderate pain. Reported on 01/27/2016    . ipratropium (ATROVENT) 0.06 % nasal spray Place 2 sprays into the nose 4 (four) times daily as needed for rhinitis (runny nose). (Patient not taking: Reported on 01/27/2016) 45 mL 2  . mesalamine (ASACOL) 400 MG EC tablet Take 800 mg by mouth 2 (two) times daily. Reported on 01/27/2016    . omeprazole (PRILOSEC) 20 MG capsule Take 20 mg by mouth daily as needed (heartburn). Reported on 01/27/2016     No facility-administered medications prior to visit.    ROS Review of Systems  Constitutional: Positive for fatigue. Negative for appetite change and unexpected weight change.  HENT: Negative for congestion, nosebleeds, sneezing, sore throat and trouble swallowing.   Eyes:  Negative for itching and visual disturbance.  Respiratory: Positive for cough and shortness of breath.   Cardiovascular: Negative for chest pain, palpitations and leg swelling.  Gastrointestinal: Positive for diarrhea. Negative for nausea, constipation, blood in stool, abdominal distention and anal bleeding.  Genitourinary: Negative for frequency and hematuria.  Musculoskeletal: Positive for gait problem. Negative for back pain, joint swelling and neck pain.  Skin: Negative for rash.  Neurological: Negative for dizziness, tremors, speech difficulty and weakness.  Psychiatric/Behavioral: Positive for decreased concentration. Negative for sleep disturbance, dysphoric mood and agitation. The patient is not nervous/anxious.     Objective:  BP 124/78 mmHg  Pulse 66  Ht 5\' 10"  (1.778 m)  Wt 178 lb (80.74 kg)  BMI 25.54 kg/m2  SpO2 92%  BP  Readings from Last 3 Encounters:  01/27/16 124/78  01/22/16 128/62  01/19/16 120/72    Wt Readings from Last 3 Encounters:  01/27/16 178 lb (80.74 kg)  01/22/16 178 lb (80.74 kg)  01/19/16 177 lb 12.8 oz (80.65 kg)    Physical Exam  Constitutional: He is oriented to person, place, and time. He appears well-developed. No distress.  NAD  HENT:  Mouth/Throat: Oropharynx is clear and moist.  Eyes: Conjunctivae are normal. Pupils are equal, round, and reactive to light.  Neck: Normal range of motion. No JVD present. No thyromegaly present.  Cardiovascular: Normal rate, regular rhythm, normal heart sounds and intact distal pulses.  Exam reveals no gallop and no friction rub.   No murmur heard. Pulmonary/Chest: Breath sounds normal. He is in respiratory distress. He has no wheezes. He has no rales. He exhibits no tenderness.  Abdominal: Soft. Bowel sounds are normal. He exhibits no distension and no mass. There is no tenderness. There is no rebound and no guarding.  Musculoskeletal: Normal range of motion. He exhibits no edema or tenderness.  Lymphadenopathy:    He has no cervical adenopathy.  Neurological: He is alert and oriented to person, place, and time. He has normal reflexes. No cranial nerve deficit. He exhibits normal muscle tone. He displays a negative Romberg sign. Coordination and gait normal.  Skin: Skin is warm and dry. No rash noted.  Psychiatric: He has a normal mood and affect. His behavior is normal. Judgment and thought content normal.  on O2  Lab Results  Component Value Date   WBC 8.1 12/04/2015   HGB 14.2 12/04/2015   HCT 42.9 12/04/2015   PLT 149.0* 12/04/2015   GLUCOSE 101* 12/04/2015   CHOL 252* 02/04/2015   TRIG 141.0 02/04/2015   HDL 33.40* 02/04/2015   LDLCALC 190* 02/04/2015   ALT 17 01/22/2016   AST 20 01/22/2016   NA 141 12/04/2015   K 3.5 12/04/2015   CL 104 12/04/2015   CREATININE 0.99 12/04/2015   BUN 16 12/04/2015   CO2 29 12/04/2015   TSH  34.35* 07/28/2015   PSA 1.91 11/09/2010    Dg Pelvis 1-2 Views  10/23/2015  CLINICAL DATA:  False morning in bathroom with bilateral hip pain, initial encounter EXAM: PELVIS - 1-2 VIEW COMPARISON:  None. FINDINGS: Pelvic ring is intact. Degenerative changes of the hip joints are noted bilaterally. No acute fracture or dislocation is seen. No soft tissue abnormality is noted. IMPRESSION: No acute abnormality noted. Electronically Signed   By: Inez Catalina M.D.   On: 10/23/2015 10:57   Dg Shoulder Right  10/23/2015  CLINICAL DATA:  Fall in bathroom with right shoulder pain, initial encounter EXAM: RIGHT SHOULDER - 2+ VIEW COMPARISON:  06/04/2015. FINDINGS: Mild degenerative changes of the acromioclavicular joint are seen. No acute fracture or dislocation is noted. Patchy interstitial changes are noted throughout the right lung stable from the previous exam. IMPRESSION: No acute abnormality noted. Electronically Signed   By: Inez Catalina M.D.   On: 10/23/2015 10:58    Assessment & Plan:   Diagnoses and all orders for this visit:  Well adult exam  Essential hypertension  IPF (idiopathic pulmonary fibrosis) ([place])  Other ulcerative colitis without complication ([place])  Hypothyroidism, unspecified hypothyroidism type  Right-sided low back pain with right-sided sciatica  Greater trochanteric bursitis of right hip  Chronic respiratory failure with hypoxia ([place])  I am having Mr. Villasenor maintain his aspirin, Fish Oil, Vitamin D3, omeprazole, Fexofenadine HCl (KLS ALLER-FEX PO), Garlic, folic acid, ipratropium, OVER THE COUNTER MEDICATION, amLODipine, levothyroxine, donepezil, carvedilol, losartan, mesalamine, ibuprofen, calcium carbonate, Nintedanib, ASACOL HD, allopurinol, gabapentin, primidone, and traMADol.  No orders of the defined types were placed in this encounter.     Follow-up: Return in about 3 months (around 04/25/2016) for a follow-up visit.  [name], MD

## 2016-01-27 NOTE — Progress Notes (Signed)
Pre visit review using our clinic review tool, if applicable. No additional management support is needed unless otherwise documented below in the visit note. 

## 2016-01-28 ENCOUNTER — Telehealth: Payer: Self-pay | Admitting: Internal Medicine

## 2016-01-28 NOTE — Telephone Encounter (Signed)
pls contact the agency that brings O2. They'll fax a Rx for humidifier and I'll sign it Thx

## 2016-01-28 NOTE — Telephone Encounter (Signed)
Pt's daughter, [name] called regarding the AVS. Dr. Camila Li handwritten Oxygen humidifer on it.  There was something said about if he purchases one, he will put an order in.  She's not sure what to do. Can you please check with Dr. Camila Li to see what she needs to do. She can be reached at [phone]

## 2016-01-29 DIAGNOSIS — J9611 Chronic respiratory failure with hypoxia: Secondary | ICD-10-CM | POA: Diagnosis not present

## 2016-01-29 DIAGNOSIS — J84112 Idiopathic pulmonary fibrosis: Secondary | ICD-10-CM | POA: Diagnosis not present

## 2016-01-29 DIAGNOSIS — J849 Interstitial pulmonary disease, unspecified: Secondary | ICD-10-CM | POA: Diagnosis not present

## 2016-01-29 NOTE — Telephone Encounter (Signed)
Pt's daughter, [name] informed of below.

## 2016-02-02 DIAGNOSIS — N3942 Incontinence without sensory awareness: Secondary | ICD-10-CM | POA: Diagnosis not present

## 2016-02-02 DIAGNOSIS — N3941 Urge incontinence: Secondary | ICD-10-CM | POA: Diagnosis not present

## 2016-02-02 DIAGNOSIS — Z0000 Encounter for general adult medical examination without abnormal findings: Secondary | ICD-10-CM | POA: Diagnosis not present

## 2016-02-02 DIAGNOSIS — N3944 Nocturnal enuresis: Secondary | ICD-10-CM | POA: Diagnosis not present

## 2016-02-09 ENCOUNTER — Other Ambulatory Visit: Payer: Self-pay | Admitting: Internal Medicine

## 2016-02-09 ENCOUNTER — Telehealth: Payer: Self-pay | Admitting: Pulmonary Disease

## 2016-02-09 NOTE — Telephone Encounter (Signed)
Noted  

## 2016-02-09 NOTE — Telephone Encounter (Signed)
Spoke with pt's daughter, states that pt's Chris Stokes has been causing pt diarrhea X2 weeks-pt stopped taking medication on Saturday-s/s have improved since stopping the medication.  Pt's daughter states he followed the BRAT diet but still had severe GI side effects.  Pt states that he does not want to continue this medication because of the side effects.     FYI to VS.  Please advise if anything further is needed.

## 2016-02-09 NOTE — Telephone Encounter (Signed)
Called and spoke with pt's daughter [name]. She states that the patient is going to try to take imodium to help with the diarrhea. She states that the patient has decided to start back on the [place]. She is requesting that VS be notified that the patient wants to start back on OFEV. She is also requesting his recs on the imodium and if it is okay for him to just start back on the [place] or if he needs to wait a few days. I explained to her that I would send a message to VS and once we receive his answer we will return her call. She voiced understanding and had no further questions.    VS please advise

## 2016-02-10 ENCOUNTER — Telehealth: Payer: Self-pay | Admitting: Pulmonary Disease

## 2016-02-10 DIAGNOSIS — J849 Interstitial pulmonary disease, unspecified: Secondary | ICD-10-CM | POA: Diagnosis not present

## 2016-02-10 DIAGNOSIS — J9611 Chronic respiratory failure with hypoxia: Secondary | ICD-10-CM | POA: Diagnosis not present

## 2016-02-10 DIAGNOSIS — J84112 Idiopathic pulmonary fibrosis: Secondary | ICD-10-CM | POA: Diagnosis not present

## 2016-02-10 NOTE — Telephone Encounter (Signed)
lmomtcb for pt's daughter, [name]

## 2016-02-10 NOTE — Telephone Encounter (Signed)
Pt daughter lynn cb [phone]

## 2016-02-10 NOTE — Telephone Encounter (Signed)
Patient notified of Dr. Juanetta Gosling recommendations from [place] 02/09/16 Nothing further needed.

## 2016-02-10 NOTE — Telephone Encounter (Signed)
Patient's daughter calling back. Dr. Halford Chessman, please advise on below message.

## 2016-02-10 NOTE — Telephone Encounter (Signed)
He should d/w Dr. Henrene Pastor with GI first about what to do with diarrhea.  He should wait to start Ofev again until he gets the okay from Dr. Henrene Pastor.

## 2016-02-10 NOTE — Telephone Encounter (Signed)
Received message from Dr. Henrene Pastor >> he advised to have him stay off Ofev until diarrhea better.  Once diarrhea better, then he can resume Ofev.  If diarrhea recurs, then likely from Ofev.  If his diarrhea persists off Ofev, then likely from colitis flare and he would need f/u with GI.

## 2016-02-10 NOTE — Telephone Encounter (Signed)
Spoke with pt's daughter, [name].  Discussed below recs per Dr. Perry/Dr. Halford Chessman.  [name] verbalized understanding and will inform pt.  She will have him call office if he has further questions or concerns.  She voiced no further questions or concerns at this time and very appreciative of our help.

## 2016-03-01 ENCOUNTER — Telehealth: Payer: Self-pay | Admitting: Pulmonary Disease

## 2016-03-01 NOTE — Telephone Encounter (Signed)
Okay to d/c ofev.  He can cancel ROV this week with me >> needs to reschedule ROV for 6 months.  He should call Dr. Henrene Pastor with GI to discuss tx options for persistent diarrhea >> not sure if related to Ofev or worsening of his inflammatory bowel disease.

## 2016-03-01 NOTE — Telephone Encounter (Signed)
Spoke with pt's granddaughter [name], aware of recs.  appt cancelled for this week, recall placed for 6 month rov.  Nothing further needed.

## 2016-03-01 NOTE — Telephone Encounter (Signed)
Spoke with Chris Stokes(daughter)- pt is still not tolerating the OFEV.  Pt is still having diarrhea and it seems that it is beginning to irritate his Ulcerative Colitis.  Pt has tried using Imodium after each dose and it stops the diarrhea for a few days but then diarrhea returns.  Denies nausea/vomiting. Does not feel well, loss of appetite and when he does eat he feels instantly bloated.  Pt wishes to stop the OFEV. Any rec's on a good Probiotic to restore good bacteria in GI tract? Patient is scheduled for OV with VS next week and pt daughter is wanting to know if they need to keep this appt since he is stopping the OFEV and will no loner need the follow up labs.  Please advise Dr Halford Chessman. Thanks.

## 2016-03-05 DIAGNOSIS — Z0000 Encounter for general adult medical examination without abnormal findings: Secondary | ICD-10-CM | POA: Diagnosis not present

## 2016-03-05 DIAGNOSIS — N3941 Urge incontinence: Secondary | ICD-10-CM | POA: Diagnosis not present

## 2016-03-05 DIAGNOSIS — N3944 Nocturnal enuresis: Secondary | ICD-10-CM | POA: Diagnosis not present

## 2016-03-09 ENCOUNTER — Other Ambulatory Visit: Payer: Self-pay | Admitting: Internal Medicine

## 2016-03-09 ENCOUNTER — Telehealth: Payer: Self-pay

## 2016-03-09 ENCOUNTER — Telehealth: Payer: Self-pay | Admitting: Internal Medicine

## 2016-03-09 NOTE — Telephone Encounter (Signed)
Pt was started on Ofev for pulmonary hypertension. One bad side effect is diarrhea. Pt has been off of med since March but continues to have explosive uncontrolled diarrhea. Pt has tried imodium and it does not help. Pt scheduled to see Alonza Bogus PA 03/15/16@2pm . Pts daughter aware of appt.

## 2016-03-11 ENCOUNTER — Ambulatory Visit: Payer: PPO | Admitting: Pulmonary Disease

## 2016-03-12 DIAGNOSIS — J9611 Chronic respiratory failure with hypoxia: Secondary | ICD-10-CM | POA: Diagnosis not present

## 2016-03-12 DIAGNOSIS — J849 Interstitial pulmonary disease, unspecified: Secondary | ICD-10-CM | POA: Diagnosis not present

## 2016-03-12 DIAGNOSIS — J84112 Idiopathic pulmonary fibrosis: Secondary | ICD-10-CM | POA: Diagnosis not present

## 2016-03-15 ENCOUNTER — Ambulatory Visit (INDEPENDENT_AMBULATORY_CARE_PROVIDER_SITE_OTHER): Payer: PPO | Admitting: Gastroenterology

## 2016-03-15 ENCOUNTER — Encounter: Payer: Self-pay | Admitting: Gastroenterology

## 2016-03-15 VITALS — BP 128/70 | HR 60 | Ht 69.0 in | Wt 174.0 lb

## 2016-03-15 DIAGNOSIS — R197 Diarrhea, unspecified: Secondary | ICD-10-CM | POA: Diagnosis not present

## 2016-03-15 DIAGNOSIS — K51818 Other ulcerative colitis with other complication: Secondary | ICD-10-CM

## 2016-03-15 NOTE — Progress Notes (Signed)
     03/15/2016 Chris Stokes [id number] 09-Mar-1929   History of Present Illness:  This is an 82 year old male who is known to Dr. Henrene Pastor for treatment of his ulcerative colitis. He is on mesalamine/Asacol 800 mg 2 tablets 3 times daily (4.8 grams daily).  He was last seen in May 2016 at which time he was on a prednisone taper. He apparently requires prednisone taper once or twice a year for his flares. He is here today for complaints of diarrhea.  He was recently started on Ofev for his pulmonary HTN (for which he is also now on O2 for the past year).  A side effect of that medication is diarrhea.  He said that he had severe diarrhea while on the medication. When he discontinued the medication his diarrhea improved, but he thought that it caused his colitis to flare so he had a prescription of prednisone at home which he started as well. He has now been taking prednisone 40 mg daily for the past week. He admits that he had only minimal blood and mucus in his stool and says that usually with ulcerative colitis flares he has more of that compared to this time around. He is now having only one or so bowel movements a day. Yesterday he did not have a bowel movement at all and today he had one that was formed, but in long pieces.  Last colonoscopy 11/2012 by Dr. Henrene Pastor was normal.     Current Medications, Allergies, Past Medical History, Past Surgical History, Family History and Social History were reviewed in [place] record.   Physical Exam: BP 128/70 mmHg  Pulse 60  Ht 5\' 9"  (1.753 m)  Wt 174 lb (78.926 kg)  BMI 25.68 kg/m2 General: Well developed white male in no acute distress Head: Normocephalic and atraumatic Eyes:  Sclerae anicteric, conjunctiva pink  Ears: Normal auditory acuity Lungs: Clear throughout to auscultation Heart: Regular rate and rhythm Abdomen: Soft, non-distended.  Normal bowel sounds.  Non-tender. Musculoskeletal: Symmetrical with no gross  deformities  Extremities: No edema  Neurological: Alert oriented x 4, grossly non-focal Psychological:  Alert and cooperative. Normal mood and affect  Assessment and Recommendations: -Diarrhea:  He does have ulcerative colitis, but overall his symptoms were well controlled until he was recently placed on Ofev for pulmonary HTN; side effect of this is diarrhea.  He has now been off of that medication but thought that it caused his colitis to flare so started himself on prednisone one week ago.  He also started a probiotic.  Seems much improved/resolved.  Will continue the probiotic for now.  The prednisone that he has a prescription for was a taper over the course of 8 weeks. We will have him taper down by 10 mg weekly instead (has already been on 40 mg for one week), in case this was actually a flare of his ulcerative colitis.

## 2016-03-15 NOTE — Patient Instructions (Addendum)
Please start tapering your Prednisone by 10 mg every WEEK.  Continue the probiotic daily.  Thank you for choosing [facility]  Alonza Bogus, P.A

## 2016-03-16 NOTE — Progress Notes (Signed)
Agree with initial assessment and plans. If bowel habits return to normal, could be rechallenged with [facility]

## 2016-03-18 ENCOUNTER — Ambulatory Visit: Payer: PPO | Admitting: Adult Health

## 2016-03-22 ENCOUNTER — Other Ambulatory Visit: Payer: Self-pay | Admitting: Internal Medicine

## 2016-04-11 DIAGNOSIS — J9611 Chronic respiratory failure with hypoxia: Secondary | ICD-10-CM | POA: Diagnosis not present

## 2016-04-11 DIAGNOSIS — J84112 Idiopathic pulmonary fibrosis: Secondary | ICD-10-CM | POA: Diagnosis not present

## 2016-04-11 DIAGNOSIS — J849 Interstitial pulmonary disease, unspecified: Secondary | ICD-10-CM | POA: Diagnosis not present

## 2016-05-03 ENCOUNTER — Ambulatory Visit (INDEPENDENT_AMBULATORY_CARE_PROVIDER_SITE_OTHER): Payer: PPO | Admitting: Internal Medicine

## 2016-05-03 ENCOUNTER — Encounter: Payer: Self-pay | Admitting: Internal Medicine

## 2016-05-03 ENCOUNTER — Other Ambulatory Visit (INDEPENDENT_AMBULATORY_CARE_PROVIDER_SITE_OTHER): Payer: PPO

## 2016-05-03 VITALS — BP 118/76 | HR 64 | Wt 174.0 lb

## 2016-05-03 DIAGNOSIS — E034 Atrophy of thyroid (acquired): Secondary | ICD-10-CM

## 2016-05-03 DIAGNOSIS — F0390 Unspecified dementia without behavioral disturbance: Secondary | ICD-10-CM

## 2016-05-03 DIAGNOSIS — J84112 Idiopathic pulmonary fibrosis: Secondary | ICD-10-CM

## 2016-05-03 DIAGNOSIS — E038 Other specified hypothyroidism: Secondary | ICD-10-CM

## 2016-05-03 DIAGNOSIS — E785 Hyperlipidemia, unspecified: Secondary | ICD-10-CM

## 2016-05-03 DIAGNOSIS — I10 Essential (primary) hypertension: Secondary | ICD-10-CM

## 2016-05-03 DIAGNOSIS — I259 Chronic ischemic heart disease, unspecified: Secondary | ICD-10-CM | POA: Diagnosis not present

## 2016-05-03 LAB — BASIC METABOLIC PANEL
BUN: 17 mg/dL (ref 6–23)
CALCIUM: 10 mg/dL (ref 8.4–10.5)
CHLORIDE: 104 meq/L (ref 96–112)
CO2: 27 meq/L (ref 19–32)
CREATININE: 1.08 mg/dL (ref 0.40–1.50)
GFR: 69.12 mL/min (ref >60.00)
GLUCOSE: 90 mg/dL (ref 70–99)
Potassium: 4.5 mEq/L (ref 3.5–5.1)
Sodium: 138 mEq/L (ref 135–145)

## 2016-05-03 LAB — TSH: TSH: 18.71 u[IU]/mL — ABNORMAL HIGH (ref 0.35–4.50)

## 2016-05-03 LAB — HEPATIC FUNCTION PANEL
ALBUMIN: 3.8 g/dL (ref 3.5–5.2)
ALT: 13 U/L (ref 0–53)
AST: 18 U/L (ref 0–37)
Alkaline Phosphatase: 68 U/L (ref 39–117)
Bilirubin, Direct: 0.1 mg/dL (ref 0.0–0.3)
TOTAL PROTEIN: 8 g/dL (ref 6.0–8.3)
Total Bilirubin: 0.4 mg/dL (ref 0.2–1.2)

## 2016-05-03 LAB — T4, FREE: Free T4: 0.83 ng/dL (ref 0.60–1.60)

## 2016-05-03 NOTE — Assessment & Plan Note (Signed)
On Aricept 

## 2016-05-03 NOTE — Assessment & Plan Note (Signed)
On O2 

## 2016-05-03 NOTE — Progress Notes (Signed)
Subjective:  Patient ID: Chris Stokes, male    DOB: 1929/06/03  Age: 82 y.o. MRN: [id number]  CC: No chief complaint on file.   HPI Chris Stokes presents for HTN, IPF, CAD f/u  Outpatient Prescriptions Prior to Visit  Medication Sig Dispense Refill  . allopurinol (ZYLOPRIM) 100 MG tablet Take 100 mg by mouth 2 (two) times daily.    [name] allopurinol (ZYLOPRIM) 100 MG tablet TAKE TWO TABLETS BY MOUTH ONCE DAILY 180 tablet 3  . amLODipine (NORVASC) 5 MG tablet TAKE ONE TABLET BY MOUTH ONCE DAILY 90 tablet 3  . aspirin 81 MG tablet Take 81 mg by mouth daily.      . calcium carbonate (TUMS - DOSED IN MG ELEMENTAL CALCIUM) 500 MG chewable tablet Chew 1 tablet by mouth daily as needed for indigestion or heartburn. Reported on 01/27/2016    . carvedilol (COREG) 25 MG tablet TAKE ONE-HALF TABLET BY MOUTH TWICE DAILY WITH A MEAL 90 tablet 3  . Cholecalciferol (VITAMIN D3) 2000 UNITS TABS Take 1 tablet by mouth 2 (two) times daily.      [name] donepezil (ARICEPT) 10 MG tablet TAKE ONE TABLET BY MOUTH AT BEDTIME 90 tablet 3  . Fexofenadine HCl (KLS ALLER-FEX PO) Take by mouth. 120 mg take one tablet by mouth daily     . folic acid (FOLVITE) Q000111Q MCG tablet Take 800 mcg by mouth daily.      [name] gabapentin (NEURONTIN) 300 MG capsule Take 300 mg by mouth every morning. Take 600mg  by mouth every evening    . gabapentin (NEURONTIN) 300 MG capsule TAKE ONE CAPSULE BY MOUTH THREE TIMES DAILY 270 capsule 3  . Garlic [id number] MG CAPS Take 1 capsule by mouth 2 (two) times daily. Reported on 01/27/2016    . ibuprofen (ADVIL,MOTRIN) 200 MG tablet Take 400 mg by mouth 2 (two) times daily as needed for moderate pain. Reported on 01/27/2016    . ipratropium (ATROVENT) 0.06 % nasal spray Place 2 sprays into the nose 4 (four) times daily as needed for rhinitis (runny nose). 45 mL 2  . levothyroxine (SYNTHROID, LEVOTHROID) 137 MCG tablet TAKE ONE TABLET BY MOUTH ONCE DAILY BEFORE BREAKFAST 90 tablet 3  . losartan (COZAAR) 100 MG tablet  TAKE ONE TABLET BY MOUTH ONCE DAILY 90 tablet 3  . Mesalamine 800 MG TBEC TAKE TWO TABLETS BY MOUTH THREE TIMES DAILY 180 tablet 1  . Omega-3 Fatty Acids (FISH OIL) 1000 MG CAPS Take 1,000 mg by mouth daily.     [name] omeprazole (PRILOSEC) 20 MG capsule Take 20 mg by mouth daily as needed (heartburn). Reported on 01/27/2016    . OVER THE COUNTER MEDICATION Liquid Life 1 oz. daily    . primidone (MYSOLINE) 50 MG tablet Take 50 mg by mouth every morning. Take 100mg  by mouth every evening    . primidone (MYSOLINE) 50 MG tablet TAKE ONE TABLET BY MOUTH THREE TIMES DAILY FOR  TREMOR 270 tablet 3  . traMADol (ULTRAM) 50 MG tablet Take 50-100 mg by mouth every 12 (twelve) hours as needed for moderate pain or severe pain.    [name] amLODipine (NORVASC) 5 MG tablet Take 1 tablet (5 mg total) by mouth daily. 90 tablet 3  . ASACOL HD 800 MG TBEC TAKE TWO TABLETS BY MOUTH THREE TIMES DAILY 180 tablet 0  . carvedilol (COREG) 25 MG tablet Take 0.5 tablets (12.5 mg total) by mouth 2 (two) times daily with a meal. 90 tablet 3  . donepezil (  ARICEPT) 10 MG tablet Take 1 tablet (10 mg total) by mouth at bedtime. 90 tablet 3  . levothyroxine (SYNTHROID) 137 MCG tablet Take 1 tablet (137 mcg total) by mouth daily before breakfast. 90 tablet 3  . losartan (COZAAR) 100 MG tablet Take 1 tablet (100 mg total) by mouth daily. 90 tablet 3  . mesalamine (ASACOL) 400 MG EC tablet Take 800 mg by mouth 2 (two) times daily. Reported on 03/15/2016     No facility-administered medications prior to visit.    ROS Review of Systems  Constitutional: Negative for appetite change, fatigue and unexpected weight change.  HENT: Negative for congestion, nosebleeds, sneezing, sore throat and trouble swallowing.   Eyes: Negative for itching and visual disturbance.  Respiratory: Positive for shortness of breath. Negative for cough.   Cardiovascular: Negative for chest pain, palpitations and leg swelling.  Gastrointestinal: Negative for nausea,  diarrhea, blood in stool and abdominal distention.  Genitourinary: Negative for frequency and hematuria.  Musculoskeletal: Positive for arthralgias. Negative for back pain, joint swelling, gait problem and neck pain.  Skin: Negative for rash.  Neurological: Negative for dizziness, tremors, speech difficulty and weakness.  Psychiatric/Behavioral: Negative for sleep disturbance, dysphoric mood and agitation. The patient is not nervous/anxious.     Objective:  BP 118/76 mmHg  Pulse 64  Wt 174 lb (78.926 kg)  SpO2 90%  BP Readings from Last 3 Encounters:  05/03/16 118/76  03/15/16 128/70  01/27/16 124/78    Wt Readings from Last 3 Encounters:  05/03/16 174 lb (78.926 kg)  03/15/16 174 lb (78.926 kg)  01/27/16 178 lb (80.74 kg)    Physical Exam  Constitutional: He is oriented to person, place, and time. He appears well-developed. No distress.  NAD  HENT:  Mouth/Throat: Oropharynx is clear and moist.  Eyes: Conjunctivae are normal. Pupils are equal, round, and reactive to light.  Neck: Normal range of motion. No JVD present. No thyromegaly present.  Cardiovascular: Normal rate, regular rhythm, normal heart sounds and intact distal pulses.  Exam reveals no gallop and no friction rub.   No murmur heard. Pulmonary/Chest: Effort normal. No respiratory distress. He has no wheezes. He has rales. He exhibits no tenderness.  Abdominal: Soft. Bowel sounds are normal. He exhibits no distension and no mass. There is no tenderness. There is no rebound and no guarding.  Musculoskeletal: Normal range of motion. He exhibits no edema or tenderness.  Lymphadenopathy:    He has no cervical adenopathy.  Neurological: He is alert and oriented to person, place, and time. He has normal reflexes. No cranial nerve deficit. He exhibits normal muscle tone. He displays a negative Romberg sign. Coordination and gait normal.  Skin: Skin is warm and dry. No rash noted.  Psychiatric: He has a normal mood and  affect. His behavior is normal. Judgment and thought content normal.  B crackles. On O2 - a tank  Lab Results  Component Value Date   WBC 8.1 12/04/2015   HGB 14.2 12/04/2015   HCT 42.9 12/04/2015   PLT 149.0* 12/04/2015   GLUCOSE 101* 12/04/2015   CHOL 252* 02/04/2015   TRIG 141.0 02/04/2015   HDL 33.40* 02/04/2015   LDLCALC 190* 02/04/2015   ALT 17 01/22/2016   AST 20 01/22/2016   NA 141 12/04/2015   K 3.5 12/04/2015   CL 104 12/04/2015   CREATININE 0.99 12/04/2015   BUN 16 12/04/2015   CO2 29 12/04/2015   TSH 34.35* 07/28/2015   PSA 1.91 11/09/2010  Dg Pelvis 1-2 Views  10/23/2015  CLINICAL DATA:  False morning in bathroom with bilateral hip pain, initial encounter EXAM: PELVIS - 1-2 VIEW COMPARISON:  None. FINDINGS: Pelvic ring is intact. Degenerative changes of the hip joints are noted bilaterally. No acute fracture or dislocation is seen. No soft tissue abnormality is noted. IMPRESSION: No acute abnormality noted. Electronically Signed   By: Inez Catalina M.D.   On: 10/23/2015 10:57   Dg Shoulder Right  10/23/2015  CLINICAL DATA:  Fall in bathroom with right shoulder pain, initial encounter EXAM: RIGHT SHOULDER - 2+ VIEW COMPARISON:  06/04/2015. FINDINGS: Mild degenerative changes of the acromioclavicular joint are seen. No acute fracture or dislocation is noted. Patchy interstitial changes are noted throughout the right lung stable from the previous exam. IMPRESSION: No acute abnormality noted. Electronically Signed   By: Inez Catalina M.D.   On: 10/23/2015 10:58    Assessment & Plan:   There are no diagnoses linked to this encounter. I am having Mr. Cantrall maintain his aspirin, Fish Oil, Vitamin D3, omeprazole, Fexofenadine HCl (KLS ALLER-FEX PO), Garlic, folic acid, ipratropium, OVER THE COUNTER MEDICATION, ibuprofen, calcium carbonate, allopurinol, gabapentin, primidone, traMADol, levothyroxine, amLODipine, donepezil, losartan, Mesalamine, gabapentin, allopurinol,  carvedilol, and primidone.  No orders of the defined types were placed in this encounter.     Follow-up: No Follow-up on file.  [name], MD

## 2016-05-03 NOTE — Assessment & Plan Note (Signed)
statin intolerant

## 2016-05-03 NOTE — Assessment & Plan Note (Signed)
Labs

## 2016-05-03 NOTE — Assessment & Plan Note (Signed)
On Coreg, Losartan 

## 2016-05-03 NOTE — Progress Notes (Signed)
Pre visit review using our clinic review tool, if applicable. No additional management support is needed unless otherwise documented below in the visit note. 

## 2016-05-03 NOTE — Assessment & Plan Note (Signed)
On Coreg, Losartan, ASA, O2

## 2016-05-10 ENCOUNTER — Telehealth: Payer: Self-pay | Admitting: Internal Medicine

## 2016-05-10 NOTE — Telephone Encounter (Signed)
Please follow up with daughter in regard to Plot writing a script for a portable oxygen tank.

## 2016-05-10 NOTE — Telephone Encounter (Signed)
I gave the pt a Rx for portable O2 Thx

## 2016-05-12 DIAGNOSIS — J9611 Chronic respiratory failure with hypoxia: Secondary | ICD-10-CM | POA: Diagnosis not present

## 2016-05-12 DIAGNOSIS — J849 Interstitial pulmonary disease, unspecified: Secondary | ICD-10-CM | POA: Diagnosis not present

## 2016-05-12 DIAGNOSIS — J84112 Idiopathic pulmonary fibrosis: Secondary | ICD-10-CM | POA: Diagnosis not present

## 2016-05-12 NOTE — Telephone Encounter (Signed)
I called pt's daughter- Pt lost Rx for portable O2. Please write another and I will mail it to his address per daughter's request.

## 2016-05-13 NOTE — Telephone Encounter (Signed)
Hand written Rx for Portable O2 concentrator mailed to pt's home address.

## 2016-05-13 NOTE — Telephone Encounter (Signed)
Done on 6/13

## 2016-05-31 ENCOUNTER — Other Ambulatory Visit: Payer: Self-pay | Admitting: Internal Medicine

## 2016-06-10 DIAGNOSIS — N3944 Nocturnal enuresis: Secondary | ICD-10-CM | POA: Diagnosis not present

## 2016-06-10 DIAGNOSIS — N3941 Urge incontinence: Secondary | ICD-10-CM | POA: Diagnosis not present

## 2016-06-11 DIAGNOSIS — J9611 Chronic respiratory failure with hypoxia: Secondary | ICD-10-CM | POA: Diagnosis not present

## 2016-06-11 DIAGNOSIS — J84112 Idiopathic pulmonary fibrosis: Secondary | ICD-10-CM | POA: Diagnosis not present

## 2016-06-11 DIAGNOSIS — J849 Interstitial pulmonary disease, unspecified: Secondary | ICD-10-CM | POA: Diagnosis not present

## 2016-07-05 ENCOUNTER — Other Ambulatory Visit: Payer: Self-pay | Admitting: Internal Medicine

## 2016-07-12 DIAGNOSIS — J849 Interstitial pulmonary disease, unspecified: Secondary | ICD-10-CM | POA: Diagnosis not present

## 2016-07-12 DIAGNOSIS — J9611 Chronic respiratory failure with hypoxia: Secondary | ICD-10-CM | POA: Diagnosis not present

## 2016-07-12 DIAGNOSIS — J84112 Idiopathic pulmonary fibrosis: Secondary | ICD-10-CM | POA: Diagnosis not present

## 2016-07-16 ENCOUNTER — Encounter: Payer: Self-pay | Admitting: Adult Health

## 2016-07-16 ENCOUNTER — Ambulatory Visit (INDEPENDENT_AMBULATORY_CARE_PROVIDER_SITE_OTHER): Payer: PPO | Admitting: Adult Health

## 2016-07-16 ENCOUNTER — Ambulatory Visit (INDEPENDENT_AMBULATORY_CARE_PROVIDER_SITE_OTHER)
Admission: RE | Admit: 2016-07-16 | Discharge: 2016-07-16 | Disposition: A | Discharge status: Home / Self Care | Payer: PPO | Source: Ambulatory Visit | Attending: Adult Health | Admitting: Adult Health

## 2016-07-16 DIAGNOSIS — J9611 Chronic respiratory failure with hypoxia: Secondary | ICD-10-CM

## 2016-07-16 DIAGNOSIS — J84112 Idiopathic pulmonary fibrosis: Secondary | ICD-10-CM | POA: Diagnosis not present

## 2016-07-16 DIAGNOSIS — R0602 Shortness of breath: Secondary | ICD-10-CM | POA: Diagnosis not present

## 2016-07-16 NOTE — Patient Instructions (Signed)
Chest xray today .  Continue on Oxygen 2l/m with activity and At bedtime   Order to home care to evaluate for light weight O2 system/POC.  Follow up with Dr. Halford Chessman  In 4-6 months and  As needed

## 2016-07-16 NOTE — Addendum Note (Signed)
Addended by: Osa Craver on: 07/16/2016 09:38 AM   Modules accepted: Orders

## 2016-07-16 NOTE — Assessment & Plan Note (Signed)
ILD -stable clinically  Advised on O2 use  Unable to tolerate OFEV, doubt Esbriet would be any different as GI issues with his colitis will be an issue Check cxr today   Plan  Patient Instructions  Chest xray today .  Continue on Oxygen 2l/m with activity and At bedtime   Order to home care to evaluate for light weight O2 system/POC.  Follow up with Dr. Halford Chessman  In 4-6 months and  As needed

## 2016-07-16 NOTE — Addendum Note (Signed)
Addended by: Osa Craver on: 07/16/2016 09:54 AM   Modules accepted: Orders

## 2016-07-16 NOTE — Assessment & Plan Note (Signed)
Advised on O2 conpliance  DME order for POC eval   Plan  Patient Instructions  Chest xray today .  Continue on Oxygen 2l/m with activity and At bedtime   Order to home care to evaluate for light weight O2 system/POC.  Follow up with Dr. Halford Chessman  In 4-6 months and  As needed

## 2016-07-16 NOTE — Progress Notes (Signed)
Subjective:    Patient ID: Chris Stokes, male    DOB: 01-May-1929, 82 y.o.   MRN: [id number]  HPI 82 yo male with IPF  PMH -Ulcerative colitis  And Dementia (Aricept)    Tests CT chest 11/10/12 >> centrilobular and paraseptal emphysema, patchy GGO, sub pleural reticulation, honeycombing, traction BTX, basilar predominate Labs 12/01/12 >> RNP Ab, SCL 70, Anti Jo, ANA, RF, anti CCP negative PFT 06/27/13 >> FEV1 2.59 (92%), FEV1% 80, TLC 4.50 (63%), DLCO 35% PFT 07/02/14 >> FEV1 2.46 (89%), FEV1% 77, TLC 5.02 (70%), DLCO 32% PFT 06/04/15 >> FEV1 2.17 (80%), FEV1% 79, TLC 4.21 (59%), DLCO 25% Ambulatory oximetry 06/04/15 >> SpO2 86%, start 2 liters oyxgen ONO with RA 08/03/15 >> test time 5 hrs 42 min. Mean SpO2 89.9%, low SpO2 71%. Spent 1 hr 14 min with SpO2 < 88%.   07/16/2016 Follow up : IPF  Pt returns for 6 month follow up .  He has underlying IPF with progressive DOE .  He was started on OFEV in January 2017  Was unable to tolerate due to diarrehae and ?ulcerative colitis flare.  Appetite is better now and diarrhea has resolved.  Pt is suppose to be on O2 2 l/m with act and bedtime.  Came to office again without oxygen  O2 sats is 86% on RA . On o2 at 2l/m >90%.  We discussed the complications of persistent hypoxia.  We discussed DME evaluation for POC or light weight tank.  He denies N/v/d, chest pain, hemoptyiss , fever or orthopnea.  Has daily dry cough . Gets winded with activity . Better with rest.  Seems to be getting worse over time.  CXR 05/2015 showed chronic ILD changes PVX (2011) and Prevnar (2015) utd.    Past Medical History:  Diagnosis Date  . CAD (coronary artery disease)    Dr. Mare Ferrari  . Cataract 1999   bilateral cataracts   . Chronic kidney disease    multiple kidney stone flare ups  . Colon polyps    adenomatous  . GERD (gastroesophageal reflux disease) 2012  . Hiatal hernia   . History of heart attack   . Hyperlipidemia   . Hypertension   .  Hypothyroidism   . Myocardial infarction ([place]) 1984  . Pulmonary fibrosis ([place])    new dx-couple weeks/ no O2  . Pulmonary fibrosis ([place]) 12/08/12  . Rectal fissure   . Seasonal allergies   . Ulcerative colitis    Dr Henrene Pastor   Current Outpatient Prescriptions on File Prior to Visit  Medication Sig Dispense Refill  . allopurinol (ZYLOPRIM) 100 MG tablet TAKE TWO TABLETS BY MOUTH ONCE DAILY 180 tablet 3  . amLODipine (NORVASC) 5 MG tablet TAKE ONE TABLET BY MOUTH ONCE DAILY 90 tablet 3  . aspirin 81 MG tablet Take 81 mg by mouth daily.      . calcium carbonate (TUMS - DOSED IN MG ELEMENTAL CALCIUM) 500 MG chewable tablet Chew 1 tablet by mouth daily as needed for indigestion or heartburn. Reported on 01/27/2016    . carvedilol (COREG) 25 MG tablet TAKE ONE-HALF TABLET BY MOUTH TWICE DAILY WITH A MEAL 90 tablet 3  . Cholecalciferol (VITAMIN D3) 2000 UNITS TABS Take 1 tablet by mouth 2 (two) times daily.      [name] donepezil (ARICEPT) 10 MG tablet TAKE ONE TABLET BY MOUTH AT BEDTIME 90 tablet 3  . Fexofenadine HCl (KLS ALLER-FEX PO) Take by mouth. 120 mg take one tablet by mouth daily     .  folic acid (FOLVITE) Q000111Q MCG tablet Take 800 mcg by mouth daily.      [name] gabapentin (NEURONTIN) 300 MG capsule Take 300 mg by mouth every morning. Take 600mg  by mouth every evening    . Garlic [id number] MG CAPS Take 1 capsule by mouth 2 (two) times daily. Reported on 01/27/2016    . ibuprofen (ADVIL,MOTRIN) 200 MG tablet Take 400 mg by mouth 2 (two) times daily as needed for moderate pain. Reported on 01/27/2016    . levothyroxine (SYNTHROID, LEVOTHROID) 137 MCG tablet TAKE ONE TABLET BY MOUTH ONCE DAILY BEFORE BREAKFAST 90 tablet 3  . losartan (COZAAR) 100 MG tablet TAKE ONE TABLET BY MOUTH ONCE DAILY 90 tablet 3  . Mesalamine 800 MG TBEC TAKE TWO TABLETS BY MOUTH THREE TIMES DAILY 180 tablet 0  . Omega-3 Fatty Acids (FISH OIL) 1000 MG CAPS Take 1,000 mg by mouth daily.     [name] omeprazole (PRILOSEC) 20 MG capsule Take 20  mg by mouth daily as needed (heartburn). Reported on 01/27/2016    . OVER THE COUNTER MEDICATION Liquid Life 1 oz. daily    . primidone (MYSOLINE) 50 MG tablet TAKE ONE TABLET BY MOUTH THREE TIMES DAILY FOR  TREMOR 270 tablet 3  . traMADol (ULTRAM) 50 MG tablet Take 50-100 mg by mouth every 12 (twelve) hours as needed for moderate pain or severe pain.    [name] ipratropium (ATROVENT) 0.06 % nasal spray Place 2 sprays into the nose 4 (four) times daily as needed for rhinitis (runny nose). (Patient not taking: Reported on 07/16/2016) 45 mL 2   No current facility-administered medications on file prior to visit.       Review of Systems Constitutional:   No  weight loss, night sweats,  Fevers, chills,  +fatigue, or  lassitude.  HEENT:   No headaches,  Difficulty swallowing,  Tooth/dental problems, or  Sore throat,                No sneezing, itching, ear ache, nasal congestion, post nasal drip,   CV:  No chest pain,  Orthopnea, PND, swelling in lower extremities, anasarca, dizziness, palpitations, syncope.   GI  No heartburn, indigestion, abdominal pain, nausea, vomiting, diarrhea, change in bowel habits, loss of appetite, bloody stools.   Resp: +shortness of breath w/ act .  No excess mucus, no productive cough,  No non-productive cough,  No coughing up of blood.  No change in color of mucus.  No wheezing.  No chest wall deformity  Skin: no rash or lesions.  GU: no dysuria, change in color of urine, no urgency or frequency.  No flank pain, no hematuria   MS:  No joint pain or swelling.  No decreased range of motion.  No back pain.  Psych:  No change in mood or affect. No depression or anxiety.  No memory loss.         Objective:   Physical Exam Vitals:   07/16/16 0908  BP: 100/68  Pulse: (!) 57  SpO2: 91%  Weight: 176 lb (79.8 kg)  Height: 5\' 10"  (1.778 m)   GEN: A/Ox3; pleasant , NAD, elderly    HEENT:  [place]/AT,  EACs-clear, TMs-wnl, NOSE-clear, THROAT-clear, no lesions, no  postnasal drip or exudate noted.   NECK:  Supple w/ fair ROM; no JVD; normal carotid impulses w/o bruits; no thyromegaly or nodules palpated; no lymphadenopathy.    RESP Faint BB crackles ,  no accessory muscle use, no dullness to percussion  CARD:  RRR, no m/r/g  , tr  peripheral edema, pulses intact, no cyanosis or clubbing.  GI:   Soft & nt; nml bowel sounds; no organomegaly or masses detected.   Musco: Warm bil, no deformities or joint swelling noted.   Neuro: alert, no focal deficits noted.    Skin: Warm, no lesions or rashes     Idabell Picking NP-C  [facility]  07/16/2016

## 2016-07-16 NOTE — Progress Notes (Signed)
Dg Chest 2 View  Result Date: 07/16/2016 CLINICAL DATA:  PULMONARY FIBROSIS.  SHORTNESS OF BREATH. EXAM: CHEST  2 VIEW COMPARISON:  06/04/2015 FINDINGS: Two-view study again shows right greater than left interstitial lung disease consistent with the reported history of pulmonary fibrosis. Given the slight difference in technique between the 2 studies, no substantial interval change is evident. The cardio pericardial silhouette is enlarged. Patient is status post CABG. The visualized bony structures of the thorax are intact. IMPRESSION: Right greater than left interstitial lung disease, stable in the interval. Electronically Signed   By: Misty Stanley M.D.   On: 07/16/2016 09:59    Reviewed and agree with assessment/plan.  Chesley Mires, MD [facility] 07/16/2016, 10:28 AM Pager:  [phone]

## 2016-07-21 ENCOUNTER — Telehealth: Payer: Self-pay | Admitting: Pulmonary Disease

## 2016-07-21 DIAGNOSIS — J9611 Chronic respiratory failure with hypoxia: Secondary | ICD-10-CM

## 2016-07-21 NOTE — Telephone Encounter (Signed)
Okay to send order. 

## 2016-07-21 NOTE — Telephone Encounter (Signed)
VS please advise if we can place the order with [facility] for portable O2 eval so they may try out different POC for the pt to see which one works best for him.  thanks

## 2016-07-21 NOTE — Telephone Encounter (Signed)
Order has been placed for the pt. Nothing further is needed.

## 2016-07-29 ENCOUNTER — Encounter: Payer: Self-pay | Admitting: Cardiology

## 2016-07-29 DIAGNOSIS — I2510 Atherosclerotic heart disease of native coronary artery without angina pectoris: Secondary | ICD-10-CM | POA: Insufficient documentation

## 2016-07-29 NOTE — Progress Notes (Signed)
PCP: [name], MD  Clinic Note: Chief Complaint  Patient presents with  . Follow-up    CAD    HPI: Chris Stokes is a 82 y.o. male with a PMH below who presents today for six-month cardiology follow-up for CAD-CABG. He is a former patient of Dr Warren Danes last seen in February 2017. --> No evidence of angina. Last Myoview in 2013  Cavion Chris Stokes has history of MI in 2004. He underwent CABG for multivessel disease. Myoview stress test in 2013 showed old inferolateral scar with no ischemia. EF 53%.- On ARB and carvedilol. Grade 1 diastolic dysfunction by echo but otherwise no valve disease. Followed by Dr. Halford Chessman from pulmonary medicine for idiopathic pulmonary fibrosis when history of asbestos exposure. He uses home oxygen - increased to 3 LPM this week.  Will get dyspneic if he walks less than a block. Sleeps on 2 pillows. No PND.  Recent Hospitalizations: None  Studies Reviewed: See below  Interval History: Mr. Chris Stokes presents today for routine follow-up. He is short of breath at baseline which is his usual. He states that his oxygen requirement has just gone up recently. However he denies any of his "anginal type symptoms which is basically get him indigestion uneasy feeling".   He has significant exertional dyspnea and is here on increased level of oxygen. Denies any PND orthopnea, but does have mild edema.  No palpitations (cannot feel PVCs), lightheadedness, dizziness, weakness or syncope/near syncope. No TIA/amaurosis fugax symptoms. No melena, hematochezia, hematuria, or epstaxis. No claudication, but does note that his left leg will give out with him greater than his right when walking. This is associated with his dyspnea. Can only walk about 100 feet without getting short of breath.  ROS: A comprehensive was performed. Review of Systems  HENT: Positive for congestion (Constant need to clear his throat.) and sore throat (constant drainage). Negative for nosebleeds.   Eyes:  Negative for blurred vision.  Respiratory: Positive for shortness of breath (Only on exertion). Negative for cough (to clear his throat) and wheezing.        Can walk ~ 100 ft without stopping  Cardiovascular: Positive for claudication (maybe - but does not walk enough) and leg swelling (just a little). Negative for chest pain, orthopnea (uses 2 pillows) and PND.       Occasional "heart skipping"  - not really noticeable  Gastrointestinal: Negative for abdominal pain, blood in stool, diarrhea (only wiht Colitis flare), heartburn and melena.       No flare of colitis in last few months.  Genitourinary: Negative for hematuria.  Musculoskeletal: Positive for back pain (OA; h/o Sciatica).  Neurological: Positive for dizziness and weakness (legs get weak after walking; L> R). Negative for loss of consciousness and headaches.  Endo/Heme/Allergies:       Cold intolerant - several years  Psychiatric/Behavioral: Negative for memory loss. The patient is not nervous/anxious and does not have insomnia.   All other systems reviewed and are negative.   Past Medical History:  Diagnosis Date  . Cataract 1999   bilateral cataracts   . Chronic kidney disease    multiple kidney stone flare ups  . Colon polyps    adenomatous  . Coronary artery disease, occlusive    Dr. Mare Ferrari; Cath 02/2003 for Inf STEMI -> dLM CAD with moderate to severe LAD, RI & Cx stenoses, subtotal RCA.--> CABG x 4  . GERD (gastroesophageal reflux disease) 2012  . Hiatal hernia   . Hyperlipidemia   .  Hypertension   . Hypothyroidism   . Pulmonary fibrosis ([place]) 12/08/2012   on Home O2 (asbestos exposure); Dr. Halford Chessman  . Rectal fissure   . Seasonal allergies   . ST elevation myocardial infarction (STEMI) of inferior wall (HCC) 1984   Inferior MI  . Subsequent ST elevation (STEMI) myocardial infarction of inferior wall ([place]) 02/2003   Subtotal RCA occlusion -- MV CAD on Cath --> CABG x 4  . Ulcerative colitis    Dr Henrene Pastor     Past Surgical History:  Procedure Laterality Date  . ABDOMINAL AORTIC ANEURYSM REPAIR  1992  . COLONOSCOPY    . CORONARY ARTERY BYPASS GRAFT  2004   4 vessel bypass: LIMA-LAD, SVG-RI, SVG-rPDA-rPL; unable to bypass the small Cx-OM vessels  . LEFT HEART CATH  02/2003   Inferior STEMI: dLM 60-70%; LAD heavy Ca2+ - mLAD 70% after D2, small pD1 60-70%, Ostial sm-mod D2 60-70%, Mod pRI 60%, Mod pCx 60%; Large/Dominant RCA - ectatic & severely diseased throughout with ectasia/stenoses & thrombus sub-totalled pre-PDA &rPL.  [name] NM MYOVIEW LTD  09/2012   There is evidence of a small/moderate scar affecting the inferior wall in the inferolateral wall. There may be slight peri-infarct ischemia. LV Ejection Fraction: 53%.  There is mild hypokinesis of the septum and the apex.  [name] POLYPECTOMY    . TRANSTHORACIC ECHOCARDIOGRAM  03/2005   Normal LV function with no regional wall motion abnormality.. GR 1 DD. Mild aortic sclerosis without stenosis.    Prior Studies Reviewed: [place] updated  Cardiac Cath 02/2003: Inferior STEMI.  dLM 60-70%; LAD heavy Ca2+ - mLAD 70% after D2, small pD1 60-70%, Ostial sm-mod D2 60-70%, Mod pRI 60%, Mod pCx 60%; Large/Dominant RCA - ectatic & severely diseased throughout with ectasia/stenoses & thrombus sub-totalled pre-PDA &rPL.   CABG X 4 02/2003: LIMA-LAD, SVG-RI, SVG-rPDA-rPL; unable to bypass the small Cx-OM vessels   2-D Echocardiogram May 2006: Normal LV function with no regional wall motion abnormality.. GR 1 DD. Mild aortic sclerosis without stenosis.   Myoview November 2013:   There is evidence of a small/moderate scar affecting the inferior wall in the inferolateral wall. There may be slight peri-infarct ischemia. LV Ejection Fraction: 53%.  There is mild hypokinesis of the septum and the apex.   Prior to Admission medications   Medication Sig Start Date End Date Taking? Authorizing Provider  allopurinol (ZYLOPRIM) 100 MG tablet TAKE TWO TABLETS BY MOUTH  ONCE DAILY 03/22/16  Yes Evie Lacks Plotnikov, MD  amLODipine (NORVASC) 5 MG tablet TAKE ONE TABLET BY MOUTH ONCE DAILY 03/09/16  Yes Cassandria Anger, MD  aspirin 81 MG tablet Take 81 mg by mouth daily.     Yes Historical Provider, MD  calcium carbonate (TUMS - DOSED IN MG ELEMENTAL CALCIUM) 500 MG chewable tablet Chew 1 tablet by mouth daily as needed for indigestion or heartburn. Reported on 01/27/2016   Yes Historical Provider, MD  carvedilol (COREG) 25 MG tablet TAKE ONE-HALF TABLET BY MOUTH TWICE DAILY WITH A MEAL 03/22/16  Yes Cassandria Anger, MD  Cholecalciferol (VITAMIN D3) 2000 UNITS TABS Take 1 tablet by mouth 2 (two) times daily.     Yes Historical Provider, MD  donepezil (ARICEPT) 10 MG tablet TAKE ONE TABLET BY MOUTH AT BEDTIME 03/09/16  Yes Evie Lacks Plotnikov, MD  Fexofenadine HCl (KLS ALLER-FEX PO) Take by mouth. 120 mg take one tablet by mouth daily    Yes Historical Provider, MD  folic acid (FOLVITE) Q000111Q MCG tablet Take  800 mcg by mouth daily.     Yes Historical Provider, MD  gabapentin (NEURONTIN) 300 MG capsule Take 300 mg by mouth every morning. Take 600mg  by mouth every evening   Yes Historical Provider, MD  Garlic [id number] MG CAPS Take 1 capsule by mouth 2 (two) times daily. Reported on 01/27/2016   Yes Historical Provider, MD  ibuprofen (ADVIL,MOTRIN) 200 MG tablet Take 400 mg by mouth 2 (two) times daily as needed for moderate pain. Reported on 01/27/2016   Yes Historical Provider, MD  ipratropium (ATROVENT) 0.06 % nasal spray Place 2 sprays into the nose 4 (four) times daily as needed for rhinitis (runny nose). 02/15/13  Yes Cassandria Anger, MD  levothyroxine (SYNTHROID, LEVOTHROID) 137 MCG tablet TAKE ONE TABLET BY MOUTH ONCE DAILY BEFORE BREAKFAST 03/09/16  Yes Evie Lacks Plotnikov, MD  losartan (COZAAR) 100 MG tablet TAKE ONE TABLET BY MOUTH ONCE DAILY 03/09/16  Yes Cassandria Anger, MD  Mesalamine 800 MG TBEC TAKE TWO TABLETS BY MOUTH THREE TIMES DAILY 07/06/16  Yes Irene Shipper, MD  Omega-3 Fatty Acids (FISH OIL) 1000 MG CAPS Take 1,000 mg by mouth daily.    Yes Historical Provider, MD  omeprazole (PRILOSEC) 20 MG capsule Take 20 mg by mouth daily as needed (heartburn). Reported on 01/27/2016   Yes Historical Provider, MD  OVER THE COUNTER MEDICATION Liquid Life 1 oz. daily   Yes Historical Provider, MD  primidone (MYSOLINE) 50 MG tablet TAKE ONE TABLET BY MOUTH THREE TIMES DAILY FOR  TREMOR 03/22/16  Yes Evie Lacks Plotnikov, MD  traMADol (ULTRAM) 50 MG tablet Take 50-100 mg by mouth every 12 (twelve) hours as needed for moderate pain or severe pain.   Yes Historical Provider, MD    Allergies  Allergen Reactions  . Lipitor [Atorvastatin]     cramps  . Ramipril     cough  . Simvastatin     REACTION: myalgia  . Sulfonamide Derivatives     REACTION: rash    Social History   Social History  . Marital status: Married    Spouse name: N/A  . Number of children: 2  . Years of education: N/A   Occupational History  . retired   .     Social History Main Topics  . Smoking status: Former Smoker    Packs/day: 2.00    Years: 25.00    Types: Cigarettes    Quit date: 11/29/1966  . Smokeless tobacco: Never Used  . Alcohol use No  . Drug use: No  . Sexual activity: No   Other Topics Concern  . None   Social History Narrative  . None    family history includes Arthritis/Rheumatoid in his mother; Colon cancer in his cousin; Emphysema in his brother and father; Hyperlipidemia in his brother, father, and sister; Hypertension in his father and other; Lymphoma in his sister.   Wt Readings from Last 3 Encounters:  07/30/16 178 lb (80.7 kg)  07/16/16 176 lb (79.8 kg)  05/03/16 174 lb (78.9 kg)    PHYSICAL EXAM BP 126/82   Pulse 77   Ht 5\' 10"  (1.778 m)   Wt 178 lb (80.7 kg)   BMI 25.54 kg/m  General appearance: alert, cooperative, appears stated age, no distress and Nourished, well groomed. Somewhat frail with unsteady gait using home  oxygen. HEENT: [place]/AT, EOMI, MMM, anicteric sclera Neck: no adenopathy, no carotid bruit and no JVD Lungs: Dry rhonchi and crackles. No true rales., normal percussion bilaterally and  non-labored Heart: regular rate and rhythm with ectopy, S1 normal, split S2, no murmur, click, rub or gallop ; nondisplaced PMI Abdomen: soft, non-tender; bowel sounds normal; no masses,  no organomegaly; no HJR Extremities: extremities normal, atraumatic, no cyanosis, or edema  Pulses: 2+ and symmetric;  Skin: Warm dry and intact. No rashes. Neurologic: Mental status: Alert, oriented, thought content appropriate; Cranial nerves: normal (II-XII grossly intact) Psych: Normal mood and affect.    Adult ECG Report  Rate: 77 ;  Rhythm: normal sinus rhythm, premature ventricular contractions (PVC) and 1 AV block (PR interval 242); left axis deviation (-35). Inferior infarct, age undetermined. Inferior T waves inversions -- chronic. possible left atrial.  Narrative Interpretation: Relatively stable EKG when compared to 2016.   Other studies Reviewed: Additional studies/ records that were reviewed today include:  Recent Labs:   Lab Results  Component Value Date   CHOL 211 (H) 07/30/2016   HDL 38 (L) 07/30/2016   LDLCALC 156 (H) 07/30/2016   TRIG 85 07/30/2016   CHOLHDL 5.6 (H) 07/30/2016    ASSESSMENT / PLAN: Problem List Items Addressed This Visit    Ischemic heart disease (Chronic)    No angina on beta blocker, calcium channel blocker and home oxygen. Cannot tell if this increase is potentially related to CAD. We are checking a follow-up Myoview stress test.      Relevant Orders   EKG 12-Lead   Lipid panel (Completed)   Comprehensive metabolic panel (Completed)   Myocardial Perfusion Imaging   IPF (idiopathic pulmonary fibrosis) (HCC)   Relevant Orders   EKG 12-Lead   Lipid panel (Completed)   Comprehensive metabolic panel (Completed)   Myocardial Perfusion Imaging   Hyperlipidemia with target  LDL less than 70; Statin intolerant (Chronic)    Intolerant of statins. He is due for lipid panel and chemistry panel will go ahead and check 1 before seeing back he is on fish oil.      Relevant Orders   EKG 12-Lead   Lipid panel (Completed)   Comprehensive metabolic panel (Completed)   Myocardial Perfusion Imaging   Essential hypertension (Chronic)    Well-controlled on Coreg, losartan and amlodipine.      Relevant Orders   EKG 12-Lead   Lipid panel (Completed)   Comprehensive metabolic panel (Completed)   Myocardial Perfusion Imaging   Coronary artery disease, occlusive (Chronic)   Relevant Orders   EKG 12-Lead   Lipid panel (Completed)   Comprehensive metabolic panel (Completed)   Myocardial Perfusion Imaging   Coronary artery disease involving native heart without angina pectoris - Primary (Chronic)    Currently not really having any symptoms that would suggest his anginal equivalent. He does have however increase exercise requirement. As result of this, I think is not unreasonable to follow him up with screening stress test. He is now roughly 4 years out from his last stress test.  He is on a good dose of beta blocker, ARB and calcium channel blocker. His also on aspirin.   We will order stress test prior to follow-up visit.      Relevant Orders   EKG 12-Lead   Lipid panel (Completed)   Comprehensive metabolic panel (Completed)   Myocardial Perfusion Imaging   Chronic respiratory failure with hypoxia (HCC)   Relevant Orders   EKG 12-Lead   Lipid panel (Completed)   Comprehensive metabolic panel (Completed)   Myocardial Perfusion Imaging    Other Visit Diagnoses    Medication management  Relevant Orders   Lipid panel (Completed)   Comprehensive metabolic panel (Completed)      Current medicines are reviewed at length with the patient today. (+/- concerns): n/a The following changes have been made: none  Labs today --LIPID , CMP  SCHEDULE AT [facility]--Your physician has requested that you have a lexiscan myoview.   NO CHANGES WITH MEDICATIONS.  Your physician wants you to follow-up in: 6 MONTHS WITH DR Zadrian Mccauley- 30 MIN   Studies Ordered:   Orders Placed This Encounter  Procedures  . Lipid panel  . Comprehensive metabolic panel  . Myocardial Perfusion Imaging  . EKG 12-Lead      Glenetta Hew, M.D., M.S. Interventional Cardiologist   Pager # [phone] Phone # [phone] [address] [place],  [address]

## 2016-07-30 ENCOUNTER — Ambulatory Visit (INDEPENDENT_AMBULATORY_CARE_PROVIDER_SITE_OTHER): Payer: PPO | Admitting: Cardiology

## 2016-07-30 ENCOUNTER — Encounter: Payer: Self-pay | Admitting: Cardiology

## 2016-07-30 VITALS — BP 126/82 | HR 77 | Ht 70.0 in | Wt 178.0 lb

## 2016-07-30 DIAGNOSIS — I2510 Atherosclerotic heart disease of native coronary artery without angina pectoris: Secondary | ICD-10-CM

## 2016-07-30 DIAGNOSIS — J9611 Chronic respiratory failure with hypoxia: Secondary | ICD-10-CM

## 2016-07-30 DIAGNOSIS — J84112 Idiopathic pulmonary fibrosis: Secondary | ICD-10-CM

## 2016-07-30 DIAGNOSIS — I10 Essential (primary) hypertension: Secondary | ICD-10-CM | POA: Diagnosis not present

## 2016-07-30 DIAGNOSIS — I259 Chronic ischemic heart disease, unspecified: Secondary | ICD-10-CM | POA: Diagnosis not present

## 2016-07-30 DIAGNOSIS — Z79899 Other long term (current) drug therapy: Secondary | ICD-10-CM

## 2016-07-30 DIAGNOSIS — E785 Hyperlipidemia, unspecified: Secondary | ICD-10-CM | POA: Diagnosis not present

## 2016-07-30 LAB — COMPREHENSIVE METABOLIC PANEL
ALK PHOS: 81 U/L (ref 40–115)
ALT: 83 U/L — AB (ref 9–46)
AST: 47 U/L — AB (ref 10–35)
Albumin: 3.7 g/dL (ref 3.6–5.1)
BUN: 17 mg/dL (ref 7–25)
CHLORIDE: 107 mmol/L (ref 98–110)
CO2: 25 mmol/L (ref 20–31)
CREATININE: 1.23 mg/dL — AB (ref 0.70–1.11)
Calcium: 8.9 mg/dL (ref 8.6–10.3)
Glucose, Bld: 100 mg/dL — ABNORMAL HIGH (ref 65–99)
Potassium: 4.4 mmol/L (ref 3.5–5.3)
SODIUM: 140 mmol/L (ref 135–146)
TOTAL PROTEIN: 7.5 g/dL (ref 6.1–8.1)
Total Bilirubin: 0.5 mg/dL (ref 0.2–1.2)

## 2016-07-30 LAB — LIPID PANEL
CHOL/HDL RATIO: 5.6 ratio — AB (ref <=5.0)
Cholesterol: 211 mg/dL — ABNORMAL HIGH (ref 125–200)
HDL: 38 mg/dL — AB (ref >=40)
LDL CALC: 156 mg/dL — AB (ref <130)
Triglycerides: 85 mg/dL (ref <150)
VLDL: 17 mg/dL (ref <30)

## 2016-07-30 NOTE — Patient Instructions (Signed)
Labs today --LIPID , CMP  SCHEDULE AT [facility]--Your physician has requested that you have a lexiscan myoview. For further information please visit [email or web address]. Please follow instruction sheet, as given.   NO CHANGES WITH MEDICATIONS.  Your physician wants you to follow-up in: [place]- 30 MIN You will receive a reminder letter in the mail two months in advance. If you don't receive a letter, please call our office to schedule the follow-up appointment.  If you need a refill on your cardiac medications before your next appointment, please call your pharmacy.

## 2016-08-02 ENCOUNTER — Encounter: Payer: Self-pay | Admitting: Cardiology

## 2016-08-02 NOTE — Assessment & Plan Note (Signed)
Intolerant of statins. He is due for lipid panel and chemistry panel will go ahead and check 1 before seeing back he is on fish oil.

## 2016-08-02 NOTE — Assessment & Plan Note (Signed)
Well-controlled on Coreg, losartan and amlodipine.

## 2016-08-02 NOTE — Assessment & Plan Note (Signed)
No angina on beta blocker, calcium channel blocker and home oxygen. Cannot tell if this increase is potentially related to CAD. We are checking a follow-up Myoview stress test.

## 2016-08-02 NOTE — Assessment & Plan Note (Addendum)
Currently not really having any symptoms that would suggest his anginal equivalent. He does have however increase exercise requirement. As result of this, I think is not unreasonable to follow him up with screening stress test. He is now roughly 4 years out from his last stress test.  He is on a good dose of beta blocker, ARB and calcium channel blocker. His also on aspirin.   We will order stress test prior to follow-up visit.

## 2016-08-03 ENCOUNTER — Telehealth: Payer: Self-pay | Admitting: Cardiology

## 2016-08-03 NOTE — Telephone Encounter (Signed)
Please call,regarding pt's stress test. He would like Dr Ellyn Hack to talk to his pulmonary doctor first please.

## 2016-08-03 NOTE — Telephone Encounter (Signed)
I will let him know.  [place]

## 2016-08-03 NOTE — Telephone Encounter (Signed)
Returned call to daughter [facility]), patient requesting that MD Ellyn Hack consult with pt pulmonologist ([facility]) prior to doing stress test due to his lung conditions.  Stress test scheduled 10/3.  Advised I would make MD Ellyn Hack aware of patient request.  Daughter verbalized understanding.

## 2016-08-04 ENCOUNTER — Telehealth: Payer: Self-pay | Admitting: *Deleted

## 2016-08-04 NOTE — Telephone Encounter (Signed)
DAUGHTER AWARE. VERBALIZED UNDERSTANDING.

## 2016-08-04 NOTE — Telephone Encounter (Signed)
Spoke to daughter. Information was given. Daughter was concerned if patient test consist of using treadmill with Myoview.  RN informed daughter , that patient will be doing lexiscan Myoview. This test does not require using treadmill. Daughter verbalized understanding.  She also wanted to know if the test is positive ,will patient  go the hospital that day. rn  Informed daughter, not unless patient is in distress during or after test. Daughter verbalized understanding.

## 2016-08-04 NOTE — Telephone Encounter (Signed)
-----   Message from Leonie Man, MD sent at 08/04/2016  3:51 PM EDT ----- Regarding: FW: ? re Nuc Stress Test Pls let Mr. Oakley know that I communicated with dr. Halford Chessman.  DH  ----- Message ----- From: Chesley Mires, MD Sent: 08/04/2016   7:35 AM To: Leonie Man, MD Subject: RE: ? re Nuc Stress Test                       I agree with your plan.  Vineet  ----- Message ----- From: Leonie Man, MD Sent: 08/03/2016   3:45 PM To: Chesley Mires, MD Subject: ? re Nuc Stress Test                           Vineet,  I am taking over as Mr. Sarte cardiologist. I saw you increase his oxygen levels, it is probably related to his lung disease, but he is due for a follow-up nuclear stress test. I will schedule a Lexiscan stress test, but he wanted me to clarify what you've this would be a reasonable option.  As long as it is not actively with bronchospasm, he should be fine with Lexiscan. I don't think there is any way he can walk on treadmill. I also would not want to use dobutamine.  Any thoughts?  [place]

## 2016-08-05 ENCOUNTER — Ambulatory Visit (INDEPENDENT_AMBULATORY_CARE_PROVIDER_SITE_OTHER): Payer: PPO | Admitting: Internal Medicine

## 2016-08-05 ENCOUNTER — Encounter: Payer: Self-pay | Admitting: Internal Medicine

## 2016-08-05 VITALS — BP 122/82 | HR 57 | Wt 179.0 lb

## 2016-08-05 DIAGNOSIS — J84112 Idiopathic pulmonary fibrosis: Secondary | ICD-10-CM

## 2016-08-05 DIAGNOSIS — Z23 Encounter for immunization: Secondary | ICD-10-CM | POA: Diagnosis not present

## 2016-08-05 DIAGNOSIS — R945 Abnormal results of liver function studies: Secondary | ICD-10-CM

## 2016-08-05 DIAGNOSIS — J9611 Chronic respiratory failure with hypoxia: Secondary | ICD-10-CM

## 2016-08-05 DIAGNOSIS — I259 Chronic ischemic heart disease, unspecified: Secondary | ICD-10-CM

## 2016-08-05 DIAGNOSIS — R7989 Other specified abnormal findings of blood chemistry: Secondary | ICD-10-CM | POA: Diagnosis not present

## 2016-08-05 DIAGNOSIS — F0390 Unspecified dementia without behavioral disturbance: Secondary | ICD-10-CM

## 2016-08-05 DIAGNOSIS — M5441 Lumbago with sciatica, right side: Secondary | ICD-10-CM

## 2016-08-05 DIAGNOSIS — I10 Essential (primary) hypertension: Secondary | ICD-10-CM | POA: Diagnosis not present

## 2016-08-05 DIAGNOSIS — R269 Unspecified abnormalities of gait and mobility: Secondary | ICD-10-CM

## 2016-08-05 NOTE — Assessment & Plan Note (Signed)
9/17 mild ?etiology - ?meds related Will repeat later

## 2016-08-05 NOTE — Assessment & Plan Note (Signed)
Stable

## 2016-08-05 NOTE — Assessment & Plan Note (Signed)
On O2, on OFEV

## 2016-08-05 NOTE — Assessment & Plan Note (Signed)
Stable Aricept

## 2016-08-05 NOTE — Assessment & Plan Note (Signed)
On O2 

## 2016-08-05 NOTE — Assessment & Plan Note (Signed)
OA/MSK Tramadol rare  Potential benefits of a long term opioids use as well as potential risks (i.e. addiction risk, apnea etc) and complications (i.e. Somnolence, constipation and others) were explained to the patient and were aknowledged.

## 2016-08-05 NOTE — Assessment & Plan Note (Signed)
On Coreg, Losartan 

## 2016-08-05 NOTE — Assessment & Plan Note (Signed)
No angina On Coreg, Losartan, ASA, O2

## 2016-08-05 NOTE — Addendum Note (Signed)
Addended by: Cresenciano Lick on: 08/05/2016 11:31 AM   Modules accepted: Orders

## 2016-08-05 NOTE — Progress Notes (Signed)
Subjective:  Patient ID: Chris Stokes, male    DOB: [birth year]  Age: 82 y.o. MRN: [id number]  CC: No chief complaint on file.   HPI Chris Stokes presents for HTN, IPF, memory loss f/u  Outpatient Medications Prior to Visit  Medication Sig Dispense Refill  . allopurinol (ZYLOPRIM) 100 MG tablet TAKE TWO TABLETS BY MOUTH ONCE DAILY 180 tablet 3  . amLODipine (NORVASC) 5 MG tablet TAKE ONE TABLET BY MOUTH ONCE DAILY 90 tablet 3  . aspirin 81 MG tablet Take 81 mg by mouth daily.      . calcium carbonate (TUMS - DOSED IN MG ELEMENTAL CALCIUM) 500 MG chewable tablet Chew 1 tablet by mouth daily as needed for indigestion or heartburn. Reported on 01/27/2016    . carvedilol (COREG) 25 MG tablet TAKE ONE-HALF TABLET BY MOUTH TWICE DAILY WITH A MEAL 90 tablet 3  . Cholecalciferol (VITAMIN D3) 2000 UNITS TABS Take 1 tablet by mouth 2 (two) times daily.      [name] donepezil (ARICEPT) 10 MG tablet TAKE ONE TABLET BY MOUTH AT BEDTIME 90 tablet 3  . Fexofenadine HCl (KLS ALLER-FEX PO) Take by mouth. 120 mg take one tablet by mouth daily     . folic acid (FOLVITE) [id number] MCG tablet Take 800 mcg by mouth daily.      [name] gabapentin (NEURONTIN) 300 MG capsule Take 300 mg by mouth every morning. Take 600mg  by mouth every evening    . Garlic 123XX123 MG CAPS Take 1 capsule by mouth 2 (two) times daily. Reported on 01/27/2016    . ibuprofen (ADVIL,MOTRIN) 200 MG tablet Take 400 mg by mouth 2 (two) times daily as needed for moderate pain. Reported on 01/27/2016    . ipratropium (ATROVENT) 0.06 % nasal spray Place 2 sprays into the nose 4 (four) times daily as needed for rhinitis (runny nose). 45 mL 2  . levothyroxine (SYNTHROID, LEVOTHROID) 137 MCG tablet TAKE ONE TABLET BY MOUTH ONCE DAILY BEFORE BREAKFAST 90 tablet 3  . losartan (COZAAR) 100 MG tablet TAKE ONE TABLET BY MOUTH ONCE DAILY 90 tablet 3  . Mesalamine 800 MG TBEC TAKE TWO TABLETS BY MOUTH THREE TIMES DAILY 180 tablet 0  . Omega-3 Fatty Acids (FISH OIL) 1000 MG CAPS  Take 1,000 mg by mouth daily.     [name] omeprazole (PRILOSEC) 20 MG capsule Take 20 mg by mouth daily as needed (heartburn). Reported on 01/27/2016    . OVER THE COUNTER MEDICATION Liquid Life 1 oz. daily    . primidone (MYSOLINE) 50 MG tablet TAKE ONE TABLET BY MOUTH THREE TIMES DAILY FOR  TREMOR 270 tablet 3  . traMADol (ULTRAM) 50 MG tablet Take 50-100 mg by mouth every 12 (twelve) hours as needed for moderate pain or severe pain.     No facility-administered medications prior to visit.     ROS Review of Systems  Constitutional: Positive for fatigue. Negative for appetite change and unexpected weight change.  HENT: Negative for congestion, nosebleeds, sneezing, sore throat and trouble swallowing.   Eyes: Negative for itching and visual disturbance.  Respiratory: Positive for cough and shortness of breath.   Cardiovascular: Negative for chest pain, palpitations and leg swelling.  Gastrointestinal: Negative for abdominal distention, blood in stool, diarrhea and nausea.  Genitourinary: Negative for frequency and hematuria.  Musculoskeletal: Negative for back pain, gait problem, joint swelling and neck pain.  Skin: Negative for rash.  Neurological: Negative for dizziness, tremors, speech difficulty and weakness.  Psychiatric/Behavioral: Positive for decreased  concentration. Negative for agitation, dysphoric mood, sleep disturbance and suicidal ideas. The patient is not nervous/anxious.     Objective:  BP 122/82   Pulse (!) 57   Wt 179 lb (81.2 kg)   SpO2 95% Comment: on 3 L O2  BMI 25.68 kg/m   BP Readings from Last 3 Encounters:  08/05/16 122/82  07/30/16 126/82  07/16/16 100/68    Wt Readings from Last 3 Encounters:  08/05/16 179 lb (81.2 kg)  07/30/16 178 lb (80.7 kg)  07/16/16 176 lb (79.8 kg)    Physical Exam  Constitutional: He is oriented to person, place, and time. He appears well-developed. No distress.  NAD  HENT:  Mouth/Throat: Oropharynx is clear and moist.    Eyes: Conjunctivae are normal. Pupils are equal, round, and reactive to light.  Neck: Normal range of motion. No JVD present. No thyromegaly present.  Cardiovascular: Normal rate, regular rhythm, normal heart sounds and intact distal pulses.  Exam reveals no gallop and no friction rub.   No murmur heard. Pulmonary/Chest: He is in respiratory distress. He has no wheezes. He has rales. He exhibits no tenderness.  Abdominal: Soft. Bowel sounds are normal. He exhibits no distension and no mass. There is no tenderness. There is no rebound and no guarding.  Musculoskeletal: Normal range of motion. He exhibits no edema or tenderness.  Lymphadenopathy:    He has no cervical adenopathy.  Neurological: He is alert and oriented to person, place, and time. He has normal reflexes. No cranial nerve deficit. He exhibits normal muscle tone. He displays a negative Romberg sign. Coordination and gait normal.  Skin: Skin is warm and dry. No rash noted.  Psychiatric: He has a normal mood and affect. His behavior is normal. Judgment and thought content normal.  O2 is on  Lab Results  Component Value Date   WBC 8.1 12/04/2015   HGB 14.2 12/04/2015   HCT 42.9 12/04/2015   PLT 149.0 (L) 12/04/2015   GLUCOSE 100 (H) 07/30/2016   CHOL 211 (H) 07/30/2016   TRIG 85 07/30/2016   HDL 38 (L) 07/30/2016   LDLCALC 156 (H) 07/30/2016   ALT 83 (H) 07/30/2016   AST 47 (H) 07/30/2016   NA 140 07/30/2016   K 4.4 07/30/2016   CL 107 07/30/2016   CREATININE 1.23 (H) 07/30/2016   BUN 17 07/30/2016   CO2 25 07/30/2016   TSH 18.71 (H) 05/03/2016   PSA 1.91 11/09/2010    Dg Chest 2 View  Result Date: 07/16/2016 CLINICAL DATA:  PULMONARY FIBROSIS.  SHORTNESS OF BREATH. EXAM: CHEST  2 VIEW COMPARISON:  06/04/2015 FINDINGS: Two-view study again shows right greater than left interstitial lung disease consistent with the reported history of pulmonary fibrosis. Given the slight difference in technique between the 2 studies,  no substantial interval change is evident. The cardio pericardial silhouette is enlarged. Patient is status post CABG. The visualized bony structures of the thorax are intact. IMPRESSION: Right greater than left interstitial lung disease, stable in the interval. Electronically Signed   By: Misty Stanley M.D.   On: 07/16/2016 09:59    Assessment & Plan:   There are no diagnoses linked to this encounter. I am having Mr. Huffstetler maintain his aspirin, Fish Oil, Vitamin D3, omeprazole, Fexofenadine HCl (KLS ALLER-FEX PO), Garlic, folic acid, ipratropium, OVER THE COUNTER MEDICATION, ibuprofen, calcium carbonate, gabapentin, traMADol, levothyroxine, amLODipine, donepezil, losartan, allopurinol, carvedilol, primidone, and Mesalamine.  No orders of the defined types were placed in this encounter.  Follow-up: No Follow-up on file.  [name], MD

## 2016-08-05 NOTE — Progress Notes (Signed)
Pre visit review using our clinic review tool, if applicable. No additional management support is needed unless otherwise documented below in the visit note. 

## 2016-08-09 ENCOUNTER — Other Ambulatory Visit: Payer: Self-pay | Admitting: Internal Medicine

## 2016-08-12 ENCOUNTER — Telehealth: Payer: Self-pay | Admitting: *Deleted

## 2016-08-12 DIAGNOSIS — J84112 Idiopathic pulmonary fibrosis: Secondary | ICD-10-CM | POA: Diagnosis not present

## 2016-08-12 DIAGNOSIS — J849 Interstitial pulmonary disease, unspecified: Secondary | ICD-10-CM | POA: Diagnosis not present

## 2016-08-12 DIAGNOSIS — J9611 Chronic respiratory failure with hypoxia: Secondary | ICD-10-CM | POA: Diagnosis not present

## 2016-08-12 NOTE — Telephone Encounter (Signed)
-----   Message from Leonie Man, MD sent at 08/09/2016  1:09 PM EDT ----- Cholesterol is better than it did a year ago, still not anywhere near goal. Provided that he really is not having any significant symptoms, however.  Can reassess in follow-up appointments.  Glenetta Hew, MD

## 2016-08-12 NOTE — Telephone Encounter (Signed)
Left message to call back - in regards to lab work 

## 2016-08-25 NOTE — Telephone Encounter (Signed)
Spoke to patient's daughter [name]. Result given. Verbalized understanding. [name] states her father had lost number to call back,she was assisting him.

## 2016-08-25 NOTE — Telephone Encounter (Signed)
Follow up  Pts daughter voiced she was returning nurses call because her father lost the number to reach back out to the nurse.  Pts daughter voiced it's okay to leave vm if she does not answer.  Please f/u with pts daughter.

## 2016-08-26 ENCOUNTER — Telehealth (HOSPITAL_COMMUNITY): Payer: Self-pay

## 2016-08-26 NOTE — Telephone Encounter (Signed)
Encounter complete. 

## 2016-08-27 ENCOUNTER — Telehealth (HOSPITAL_COMMUNITY): Payer: Self-pay

## 2016-08-27 NOTE — Telephone Encounter (Signed)
Encounter complete. 

## 2016-08-31 ENCOUNTER — Ambulatory Visit (HOSPITAL_COMMUNITY)
Admission: RE | Admit: 2016-08-31 | Discharge: 2016-08-31 | Disposition: A | Discharge status: Home / Self Care | Payer: PPO | Source: Ambulatory Visit | Attending: Cardiology | Admitting: Cardiology

## 2016-08-31 DIAGNOSIS — Z87891 Personal history of nicotine dependence: Secondary | ICD-10-CM | POA: Diagnosis not present

## 2016-08-31 DIAGNOSIS — I2510 Atherosclerotic heart disease of native coronary artery without angina pectoris: Secondary | ICD-10-CM | POA: Diagnosis not present

## 2016-08-31 DIAGNOSIS — I10 Essential (primary) hypertension: Secondary | ICD-10-CM | POA: Insufficient documentation

## 2016-08-31 DIAGNOSIS — I252 Old myocardial infarction: Secondary | ICD-10-CM | POA: Insufficient documentation

## 2016-08-31 DIAGNOSIS — J9611 Chronic respiratory failure with hypoxia: Secondary | ICD-10-CM

## 2016-08-31 DIAGNOSIS — Z951 Presence of aortocoronary bypass graft: Secondary | ICD-10-CM | POA: Insufficient documentation

## 2016-08-31 DIAGNOSIS — J84112 Idiopathic pulmonary fibrosis: Secondary | ICD-10-CM

## 2016-08-31 DIAGNOSIS — Z8249 Family history of ischemic heart disease and other diseases of the circulatory system: Secondary | ICD-10-CM | POA: Diagnosis not present

## 2016-08-31 DIAGNOSIS — E785 Hyperlipidemia, unspecified: Secondary | ICD-10-CM | POA: Diagnosis not present

## 2016-08-31 DIAGNOSIS — I259 Chronic ischemic heart disease, unspecified: Secondary | ICD-10-CM | POA: Insufficient documentation

## 2016-08-31 DIAGNOSIS — J8410 Pulmonary fibrosis, unspecified: Secondary | ICD-10-CM | POA: Diagnosis not present

## 2016-08-31 HISTORY — PX: NM MYOVIEW LTD: HXRAD82

## 2016-08-31 LAB — MYOCARDIAL PERFUSION IMAGING
CHL CUP NUCLEAR SSS: 15
LVDIAVOL: 131 mL (ref 62–150)
LVSYSVOL: 94 mL
Peak HR: 64 {beats}/min
Rest HR: 47 {beats}/min
SDS: 2
SRS: 15
TID: 1.06

## 2016-08-31 MED ORDER — TECHNETIUM TC 99M TETROFOSMIN IV KIT
31.8000 | PACK | Freq: Once | INTRAVENOUS | Status: AC | PRN
  Administered 2016-08-31: 31.8 via INTRAVENOUS
  Filled 2016-08-31: qty 32

## 2016-08-31 MED ORDER — TECHNETIUM TC 99M TETROFOSMIN IV KIT
10.6000 | PACK | Freq: Once | INTRAVENOUS | Status: AC | PRN
  Administered 2016-08-31: 11 via INTRAVENOUS
  Filled 2016-08-31: qty 11

## 2016-08-31 MED ORDER — REGADENOSON 0.4 MG/5ML IV SOLN
0.4000 mg | Freq: Once | INTRAVENOUS | Status: AC
  Administered 2016-08-31: 0.4 mg via INTRAVENOUS

## 2016-09-01 ENCOUNTER — Telehealth: Payer: Self-pay | Admitting: *Deleted

## 2016-09-01 DIAGNOSIS — I259 Chronic ischemic heart disease, unspecified: Secondary | ICD-10-CM

## 2016-09-01 DIAGNOSIS — I2510 Atherosclerotic heart disease of native coronary artery without angina pectoris: Secondary | ICD-10-CM

## 2016-09-01 DIAGNOSIS — R9439 Abnormal result of other cardiovascular function study: Secondary | ICD-10-CM | POA: Insufficient documentation

## 2016-09-01 NOTE — Telephone Encounter (Signed)
Spoke to [employer].  Brantley Fling Result given . Verbalized understanding  Aware patient will be schedule for echo prior to follow up appointment.  [name] would like the scheduler to contact her concerning echo appointment

## 2016-09-01 NOTE — Telephone Encounter (Signed)
-----   Message from Leonie Man, MD sent at 09/01/2016  2:28 PM EDT ----- Stress test is a little bit concerning. It is read as "high risk "with a large area of what looks like a prior heart attack. More concerning however is that it does appear to the function is significantly reduced when compared to last evaluation. Unfortunately, this last evaluation was several years ago.  I would like to check an echocardiogram -- the most recent echocardiogram was in 2006. If the pump function is indeed down, then we would potentially want to discuss the possibility of right & left heart catheterization.  We will arrange for follow-up at my next available appointment which is probably could be 10/18.  Glenetta Hew, MD

## 2016-09-01 NOTE — Telephone Encounter (Signed)
Left message for daughter [name] to call back to discuss test results and need an echo to be schedule prior to follow up appointment

## 2016-09-07 DIAGNOSIS — N3941 Urge incontinence: Secondary | ICD-10-CM | POA: Diagnosis not present

## 2016-09-09 ENCOUNTER — Other Ambulatory Visit: Payer: Self-pay

## 2016-09-09 ENCOUNTER — Ambulatory Visit (HOSPITAL_COMMUNITY): Payer: PPO | Attending: Cardiology

## 2016-09-09 DIAGNOSIS — R9439 Abnormal result of other cardiovascular function study: Secondary | ICD-10-CM | POA: Insufficient documentation

## 2016-09-09 DIAGNOSIS — I259 Chronic ischemic heart disease, unspecified: Secondary | ICD-10-CM | POA: Diagnosis not present

## 2016-09-09 DIAGNOSIS — I2510 Atherosclerotic heart disease of native coronary artery without angina pectoris: Secondary | ICD-10-CM

## 2016-09-09 HISTORY — PX: TRANSTHORACIC ECHOCARDIOGRAM: SHX275

## 2016-09-11 DIAGNOSIS — J9611 Chronic respiratory failure with hypoxia: Secondary | ICD-10-CM | POA: Diagnosis not present

## 2016-09-11 DIAGNOSIS — J84112 Idiopathic pulmonary fibrosis: Secondary | ICD-10-CM | POA: Diagnosis not present

## 2016-09-11 DIAGNOSIS — J849 Interstitial pulmonary disease, unspecified: Secondary | ICD-10-CM | POA: Diagnosis not present

## 2016-09-13 ENCOUNTER — Other Ambulatory Visit: Payer: Self-pay | Admitting: Internal Medicine

## 2016-09-15 ENCOUNTER — Ambulatory Visit (INDEPENDENT_AMBULATORY_CARE_PROVIDER_SITE_OTHER): Payer: PPO | Admitting: Cardiology

## 2016-09-15 ENCOUNTER — Encounter: Payer: Self-pay | Admitting: Cardiology

## 2016-09-15 VITALS — BP 140/88 | HR 61 | Ht 70.0 in | Wt 181.0 lb

## 2016-09-15 DIAGNOSIS — Z01818 Encounter for other preprocedural examination: Secondary | ICD-10-CM

## 2016-09-15 DIAGNOSIS — I10 Essential (primary) hypertension: Secondary | ICD-10-CM

## 2016-09-15 DIAGNOSIS — R0609 Other forms of dyspnea: Secondary | ICD-10-CM | POA: Diagnosis not present

## 2016-09-15 DIAGNOSIS — I2510 Atherosclerotic heart disease of native coronary artery without angina pectoris: Secondary | ICD-10-CM | POA: Diagnosis not present

## 2016-09-15 DIAGNOSIS — J84112 Idiopathic pulmonary fibrosis: Secondary | ICD-10-CM

## 2016-09-15 DIAGNOSIS — E785 Hyperlipidemia, unspecified: Secondary | ICD-10-CM

## 2016-09-15 DIAGNOSIS — R9439 Abnormal result of other cardiovascular function study: Secondary | ICD-10-CM | POA: Diagnosis not present

## 2016-09-15 DIAGNOSIS — Z66 Do not resuscitate: Secondary | ICD-10-CM

## 2016-09-15 DIAGNOSIS — I255 Ischemic cardiomyopathy: Secondary | ICD-10-CM

## 2016-09-15 NOTE — Assessment & Plan Note (Signed)
Extensive CAD history now with a large fixed defect in the upper wall that is probably a new finding from his prior nuclear stress test. LVEF is also reduced. Plan:  Left and Right Heart Catheterization He is on ARB, beta blocker and aspirin. Not on statin because of intolerance.

## 2016-09-15 NOTE — Assessment & Plan Note (Signed)
On home oxygen on 3 L. Echocardiogram findings concerning for elevated PA pressures. Will assess with right heart catheterization.

## 2016-09-15 NOTE — Progress Notes (Signed)
PCP: [name], MD  Clinic Note: Chief Complaint  Patient presents with  . Follow-up    Chronic dyspnea, abnormal nuclear stress test and echocardiogram  . Dizziness    HPI: Chris Stokes is a 82 y.o. male with a Past Medical History notable for CAD-CABG as well as idiopathic pulmonary fibrosis (followed by Dr. Halford Chessman, on [place], with chronic exertional dyspnea) He is a former patient of Dr Warren Danes, who last saw him in February 2017.   Cardiac history:  Inferior STEMI 2004: Underwent CABG for multivessel CAD  Myoview in 2013: old inferolateral scar with no ischemia. EF 53%.  I saw him in September for routine follow-up, and because of his progressive dyspnea, we ordered a a Myoview stress test that showed no ischemia, but a large infarct and a very significantly reduced EF. I then ordered an echocardiogram  Studies Reviewed:   Myoview 08/31/2016  The left ventricular ejection fraction is severely decreased (<30%).  Nuclear stress EF: 28%. There is akinesis of the apical lateral, entire inferior and inferolateral walls.  There is a large in size, severe in intensity, fixed defect involving the entire inferior and inferolateral walls, apical and apical lateral walls consistent with prior infarct with no ischemia noted.  This is a high risk study due to severely reduced wall motion with segmental wall motion abnormalities consistent with prior infarct.  Echo 09/09/2016: Severely reduced LV function with EF 25-30%. Diffuse hypokinesis. GR 1 DD. Diastolic flattening of the septum. Mildly dilated aortic root. Moderate RV dilation with moderately reduced function. PA pressure roughly 50 mmHg.  Recent Hospitalizations: None  Interval History: Chris Stokes presents today for follow-up with his home oxygen. He is notably dyspneic with even talking. His company by his daughter. He really doesn't note any significant worsening of his exertional dyspnea as I last saw him,  but had noted some progressive decline over the last maybe 6 months his daughter indicates that he fell last fall, and has noted a week by week decline over the last 6 months with more pronounced dyspnea. He really doesn't complain of any chest tightness or pressure, but really does not do much walking. He wears oxygen all night long and mostly times a day. Sometimes takes it off while just sitting. His O2 requirement has gone up though recently from 2-3 L/m.  Denies any PND orthopnea, but does have mild edema.  Wears O2 all night & most of the time during the day.    No palpitations (cannot feel PVCs), lightheadedness, dizziness, weakness or syncope/near syncope. No TIA/amaurosis fugax symptoms. No melena, hematochezia, hematuria, or epstaxis. No claudication, but does note that his left leg will give out with him greater than his right when walking. This is associated with his dyspnea. Can only walk about 100 feet without getting short of breath.  ROS: A comprehensive was performed. Review of Systems  HENT: Positive for congestion (Constant need to clear his throat.). Negative for nosebleeds. Sore throat: constant drainage.   Eyes: Negative for blurred vision.  Respiratory: Positive for shortness of breath (Only on exertion; although even talking makes him breathless). Negative for cough (to clear his throat) and wheezing.        Can walk ~ 100 ft without stopping (on O2)  Cardiovascular: Positive for claudication (maybe - but does not walk enough) and leg swelling (just a little). Negative for chest pain, orthopnea (uses 2 pillows) and PND.       Occasional "heart skipping"  -  not really noticeable  Gastrointestinal: Negative for abdominal pain, blood in stool, diarrhea (only wiht Colitis flare), heartburn and melena.       No flare of colitis in last few months.  Genitourinary: Negative for hematuria.  Musculoskeletal: Positive for back pain (OA; h/o Sciatica).  Neurological: Positive for  dizziness and weakness (legs get weak after walking; L> R). Negative for loss of consciousness and headaches.  Endo/Heme/Allergies:       Cold intolerant - several years  Psychiatric/Behavioral: Negative for memory loss. The patient is not nervous/anxious and does not have insomnia.   All other systems reviewed and are negative.   Past Medical History:  Diagnosis Date  . Cataract 1999   bilateral cataracts   . Chronic kidney disease    multiple kidney stone flare ups  . Colon polyps    adenomatous  . Coronary artery disease, occlusive    Dr. Mare Ferrari; Cath 02/2003 for Inf STEMI -> dLM CAD with moderate to severe LAD, RI & Cx stenoses, subtotal RCA.--> CABG x 4: LIMA-LAD, SVG-RI, SVG-rPDA-rPL  . GERD (gastroesophageal reflux disease) 2012  . Hiatal hernia   . Hyperlipidemia   . Hypertension   . Hypothyroidism   . Pulmonary fibrosis ([place]) 12/08/2012   on Home O2 (asbestos exposure); Dr. Halford Chessman  . Rectal fissure   . Seasonal allergies   . ST elevation myocardial infarction (STEMI) of inferior wall (HCC) 1984   Inferior MI  . Subsequent ST elevation (STEMI) myocardial infarction of inferior wall 02/2003   Subtotal RCA occlusion -- MV CAD on Cath --> CABG x 4  . Ulcerative colitis    Dr Henrene Pastor    Past Surgical History:  Procedure Laterality Date  . ABDOMINAL AORTIC ANEURYSM REPAIR  1992  . CARDIAC CATHETERIZATION  02/2003   Inferior STEMI: dLM 60-70%; LAD heavy Ca2+ - mLAD 70% after D2, small pD1 60-70%, Ostial sm-mod D2 60-70%, Mod pRI 60%, Mod pCx 60%; Large/Dominant RCA - ectatic & severely diseased throughout with ectasia/stenoses & thrombus sub-totalled pre-PDA &rPL. --> Referred for CABG  . COLONOSCOPY    . CORONARY ARTERY BYPASS GRAFT  2004   4 vessel bypass: LIMA-LAD, SVG-RI, SVG-rPDA-rPL; unable to bypass the small Cx-OM vessels  . NM MYOVIEW LTD  09/2012   There is evidence of a small/moderate scar affecting the inferior wall in the inferolateral wall. There may be slight  peri-infarct ischemia. LV Ejection Fraction: 53%.  There is mild hypokinesis of the septum and the apex.  [name] NM MYOVIEW LTD  08/31/2016   Severely reduced LVEF of 28%. Akinesis of the apical lateral and entire inferior-inferolateral wall - consistent with large size severe intensity fixed defect indicative the entire inferior and inferolateral walls as well as apical lateral walls. No evidence of ischemia. Read as HIGH RISK because of low EF.  [name] POLYPECTOMY    . TRANSTHORACIC ECHOCARDIOGRAM  03/2005   Normal LV function with no regional wall motion abnormality.. GR 1 DD. Mild aortic sclerosis without stenosis.  . TRANSTHORACIC ECHOCARDIOGRAM  09/09/2016   Severely reduced LV function with EF 25-30%. Diffuse hypokinesis. GR 1 DD. Diastolic flattening of the septum. Mildly dilated aortic root. Moderate RV dilation with moderately reduced function. PA pressure roughly 50 mmHg.    Prior to Admission medications   Medication Sig Start Date End Date Taking? Authorizing Provider  allopurinol (ZYLOPRIM) 100 MG tablet TAKE TWO TABLETS BY MOUTH ONCE DAILY 03/22/16  Yes Evie Lacks Plotnikov, MD  amLODipine (NORVASC) 5 MG tablet TAKE ONE  TABLET BY MOUTH ONCE DAILY 03/09/16  Yes Evie Lacks Plotnikov, MD  aspirin 81 MG tablet Take 81 mg by mouth daily.     Yes Historical Provider, MD  calcium carbonate (TUMS - DOSED IN MG ELEMENTAL CALCIUM) 500 MG chewable tablet Chew 1 tablet by mouth daily as needed for indigestion or heartburn. Reported on 01/27/2016   Yes Historical Provider, MD  carvedilol (COREG) 25 MG tablet TAKE ONE-HALF TABLET BY MOUTH TWICE DAILY WITH A MEAL 03/22/16  Yes Cassandria Anger, MD  Cholecalciferol (VITAMIN D3) 2000 UNITS TABS Take 1 tablet by mouth 2 (two) times daily.     Yes Historical Provider, MD  donepezil (ARICEPT) 10 MG tablet TAKE ONE TABLET BY MOUTH AT BEDTIME 03/09/16  Yes Evie Lacks Plotnikov, MD  Fexofenadine HCl (KLS ALLER-FEX PO) Take by mouth. 120 mg take one tablet by mouth daily     Yes Historical Provider, MD  folic acid (FOLVITE) Q000111Q MCG tablet Take 800 mcg by mouth daily.     Yes Historical Provider, MD  gabapentin (NEURONTIN) 300 MG capsule Take 300 mg by mouth every morning. Take 600mg  by mouth every evening   Yes Historical Provider, MD  Garlic [id number] MG CAPS Take 1 capsule by mouth 2 (two) times daily. Reported on 01/27/2016   Yes Historical Provider, MD  ibuprofen (ADVIL,MOTRIN) 200 MG tablet Take 400 mg by mouth 2 (two) times daily as needed for moderate pain. Reported on 01/27/2016   Yes Historical Provider, MD  ipratropium (ATROVENT) 0.06 % nasal spray Place 2 sprays into the nose 4 (four) times daily as needed for rhinitis (runny nose). 02/15/13  Yes Cassandria Anger, MD  levothyroxine (SYNTHROID, LEVOTHROID) 137 MCG tablet TAKE ONE TABLET BY MOUTH ONCE DAILY BEFORE BREAKFAST 03/09/16  Yes Evie Lacks Plotnikov, MD  losartan (COZAAR) 100 MG tablet TAKE ONE TABLET BY MOUTH ONCE DAILY 03/09/16  Yes Cassandria Anger, MD  Mesalamine 800 MG TBEC TAKE TWO TABLETS BY MOUTH THREE TIMES DAILY 07/06/16  Yes Irene Shipper, MD  Omega-3 Fatty Acids (FISH OIL) 1000 MG CAPS Take 1,000 mg by mouth daily.    Yes Historical Provider, MD  omeprazole (PRILOSEC) 20 MG capsule Take 20 mg by mouth daily as needed (heartburn). Reported on 01/27/2016   Yes Historical Provider, MD  OVER THE COUNTER MEDICATION Liquid Life 1 oz. daily   Yes Historical Provider, MD  primidone (MYSOLINE) 50 MG tablet TAKE ONE TABLET BY MOUTH THREE TIMES DAILY FOR  TREMOR 03/22/16  Yes Evie Lacks Plotnikov, MD  traMADol (ULTRAM) 50 MG tablet Take 50-100 mg by mouth every 12 (twelve) hours as needed for moderate pain or severe pain.   Yes Historical Provider, MD    Allergies  Allergen Reactions  . Lipitor [Atorvastatin]     cramps  . Ramipril     cough  . Simvastatin     REACTION: myalgia  . Sulfonamide Derivatives     REACTION: rash    Social History   Social History  . Marital status: Married    Spouse  name: N/A  . Number of children: 2  . Years of education: N/A   Occupational History  . retired   .     Social History Main Topics  . Smoking status: Former Smoker    Packs/day: 2.00    Years: 25.00    Types: Cigarettes    Quit date: 11/29/1966  . Smokeless tobacco: Never Used  . Alcohol use No  . Drug use:  No  . Sexual activity: No   Other Topics Concern  . None   Social History Narrative  . None    family history includes Arthritis/Rheumatoid in his mother; Colon cancer in his cousin; Emphysema in his brother and father; Hyperlipidemia in his brother, father, and sister; Hypertension in his father and other; Lymphoma in his sister.   Wt Readings from Last 3 Encounters:  09/15/16 82.1 kg (181 lb)  08/31/16 80.7 kg (178 lb)  08/05/16 81.2 kg (179 lb)    PHYSICAL EXAM BP 140/88   Pulse 61   Ht 5\' 10"  (1.778 m)   Wt 82.1 kg (181 lb)   BMI 25.97 kg/m  General appearance: alert, cooperative, appears stated age, no distress and Nourished, well groomed. Somewhat frail with unsteady gait using home oxygen.Sitting in a wheelchair HEENT: [place]/AT, EOMI, MMM, anicteric sclera Neck: no adenopathy, no carotid bruit and no JVD Lungs: Dry rhonchi and crackles. No true rales., normal percussion bilaterally and non-labored Heart: regular rate and rhythm with ectopy, S1 normal, split S2, no murmur, click, rub or gallop ; nondisplaced PMI Abdomen: soft, non-tender; bowel sounds normal; no masses,  no organomegaly; no HJR Extremities: extremities normal, atraumatic, no cyanosis, or edema  Pulses: 2+ and symmetric;  Skin: Warm dry and intact. No rashes. Neurologic: Mental status: Alert, oriented, thought content appropriate; Cranial nerves: normal (II-XII grossly intact) Psych: Normal mood and affect.    Adult ECG Report  Rate: 77 ;  Rhythm: normal sinus rhythm, premature ventricular contractions (PVC) and 1 AV block (PR interval 242); left axis deviation (-35). Inferior infarct, age  undetermined. Inferior T waves inversions -- chronic. possible left atrial.  Narrative Interpretation: Relatively stable EKG when compared to 2016.   Other studies Reviewed: Additional studies/ records that were reviewed today include:  Recent Labs:   Lab Results  Component Value Date   CHOL 211 (H) 07/30/2016   HDL 38 (L) 07/30/2016   LDLCALC 156 (H) 07/30/2016   TRIG 85 07/30/2016   CHOLHDL 5.6 (H) 07/30/2016    ASSESSMENT / PLAN: Problem List Items Addressed This Visit    IPF (idiopathic pulmonary fibrosis) (HCC) (Chronic)    On home oxygen on 3 L. Echocardiogram findings concerning for elevated PA pressures. Will assess with right heart catheterization.      Hyperlipidemia with target LDL less than 70; Statin intolerant (Chronic)    Has been documented to be statin intolerant in the past. Recheck his lipids last month and LDL is well out of perspective goal. Depending on the results the cardiac catheterization we may need to think about considering a different option for lipid lowering. This would be in conjunction with discussion about his long-term desires.      Essential hypertension (Chronic)    Relatively stable on current medications including ARB, beta blocker and calcium channel blocker.      Relevant Orders   APTT   Protime-INR   CBC   Basic metabolic panel   LEFT AND RIGHT HEART CATHETERIZATION WITH CORONARY ANGIOGRAM   DOE (dyspnea on exertion)   Relevant Orders   APTT   Protime-INR   CBC   Basic metabolic panel   LEFT AND RIGHT HEART CATHETERIZATION WITH CORONARY ANGIOGRAM   Coronary artery disease involving native heart without angina pectoris - Primary (Chronic)    Extensive CAD history now with a large fixed defect in the upper wall that is probably a new finding from his prior nuclear stress test. LVEF is also reduced. Plan:  Left and Right Heart Catheterization He is on ARB, beta blocker and aspirin. Not on statin because of intolerance.       Relevant Orders   APTT   Protime-INR   CBC   Basic metabolic panel   LEFT AND RIGHT HEART CATHETERIZATION WITH CORONARY ANGIOGRAM   Cardiomyopathy, ischemic    New diagnosis of cardiomyopathy. I think some of his symptoms may be related to heart failure. Tell the difference between lung disease related dyspnea and heart failure. He does not really notice PND or orthopnea suggesting that it may be more lung related, but need to exclude.  Plan: Left and right heart catheterization to determine tissue etiology and filling pressures.  He is already on stable dose of carvedilol and losartan. Unable to titrate further as by his blood pressure, because he has some orthostatic dizziness.   Is also on amlodipine could be titrated further if necessary.  Not currently on diuretic, but will determine filling pressures based on right heart catheterization findings.      Advance directive indicates patient wish for do-not-resuscitate status (Chronic)    I had a long discussion with the patient and his daughter. He appears that he does have advanced directive form at home. His daughter indicates and he confirms that in the past she has indicated her desire for DO NOT RESUSCITATE status. They're not clear if he has a signed form at home however.  As a result of this we discussed periprocedural CODE STATUS, and he is okay with sending DO NOT RESUSCITATE for the purposes of performing cardiac catheterization.      Abnormal nuclear stress test    Significantly reduced EF with a very large inferior inferolateral scar. This seems to be a much larger infarct with a significantly reduced EF when compared to the 2013 Myoview. Reduced EF is confirmed by echocardiogram, but no wall motion changes were noted on echo.  With his increased oxygen requirement and worsening dyspnea, cannot exclude a component of CAD/ischemic cardiomyopathy related heart failure symptoms exacerbating his artery existing pulmonary disease.   He had elevated PA pressures on echo to suggest pulmonary hypertension that may potentially be related to his lung disease but cannot exclude relation to cardiomyopathy.  Plan: Based on these abnormal results with severely reduced EF, plan is to perform left and right heart catheterization via left radial and brachial approach. I suspect that we will find an occluded vein graft to the right, but need to exclude other existing disease. Also need to determine if the native right is salvageable via PCI.  I discussed the risks, benefits, alternatives and indications of right catheterization in detail as noted below. We also discussed CODE STATUS.  He indicates that he would like to be DO NOT RESUSCITATE, but agrees to resend for cath.       Other Visit Diagnoses    Pre-op testing           Performing MD:  Glenetta Hew, M.D., M.S.  Procedure:  Left and Right Heart Catheterization with Native Coronary and Graft Angiography and Possible for His Coronary Intervention  The procedure with Risks/Benefits/Alternatives and Indications was reviewed with the patient and his daughter.  All questions were answered.    Risks / Complications include, but not limited to: Death, MI, CVA/TIA, VF/VT (with defibrillation), Bradycardia (need for temporary pacer placement), contrast induced nephropathy, bleeding / bruising / hematoma / pseudoaneurysm, vascular or coronary injury (with possible emergent CT or Vascular Surgery), adverse medication reactions, infection.  Additional  risks involving the use of radiation with the possibility of radiation burns and cancer were explained in detail. The additional risk of vein graft intervention was discussed.  The patient and his daughter voice understanding and agree to proceed.     Current medicines are reviewed at length with the patient today. (+/- concerns): n/a The following changes have been made: none  Studies Ordered:   Orders Placed This Encounter    Procedures  . APTT  . Protime-INR  . CBC  . Basic metabolic panel  . LEFT AND RIGHT HEART CATHETERIZATION WITH CORONARY ANGIOGRAM   Your physician wants you to follow-up in: [place] DR Ellyn Hack- 30 MIN    Glenetta Hew, M.D., M.S. Interventional Cardiologist   Pager # [phone] Phone # [phone] [address] [place], [place] [address]

## 2016-09-15 NOTE — Patient Instructions (Addendum)
NEED LABS OBTAINED - CBC, BMP . PT ,PPT  NEED RIGHT AND LEFT HEART CATH WITH DR HARDING (PREP LEFT RADIAL ,BRACHAIL AND GROIN) Your physician has requested that you have a cardiac catheterization. Cardiac catheterization is used to diagnose and/or treat various heart conditions. Doctors may recommend this procedure for a number of different reasons. The most common reason is to evaluate chest pain. Chest pain can be a symptom of coronary artery disease (CAD), and cardiac catheterization can show whether plaque is narrowing or blocking your heart's arteries. This procedure is also used to evaluate the valves, as well as measure the blood flow and oxygen levels in different parts of your heart. For further information please visit [email or web address]. Please follow instruction sheet, as given.   NO CHANGE WITH CURRENT MEDICATIONS   Your physician wants you to follow-up in: 1 [place] will receive a reminder letter in the mail two months in advance. If you don't receive a letter, please call our office to schedule the follow-up appointment.   If you need a refill on your cardiac medications before your next appointment, please call your pharmacy.

## 2016-09-15 NOTE — Assessment & Plan Note (Signed)
Has been documented to be statin intolerant in the past. Recheck his lipids last month and LDL is well out of perspective goal. Depending on the results the cardiac catheterization we may need to think about considering a different option for lipid lowering. This would be in conjunction with discussion about his long-term desires.

## 2016-09-15 NOTE — Assessment & Plan Note (Signed)
Significantly reduced EF with a very large inferior inferolateral scar. This seems to be a much larger infarct with a significantly reduced EF when compared to the 2013 Myoview. Reduced EF is confirmed by echocardiogram, but no wall motion changes were noted on echo.  With his increased oxygen requirement and worsening dyspnea, cannot exclude a component of CAD/ischemic cardiomyopathy related heart failure symptoms exacerbating his artery existing pulmonary disease.  He had elevated PA pressures on echo to suggest pulmonary hypertension that may potentially be related to his lung disease but cannot exclude relation to cardiomyopathy.  Plan: Based on these abnormal results with severely reduced EF, plan is to perform left and right heart catheterization via left radial and brachial approach. I suspect that we will find an occluded vein graft to the right, but need to exclude other existing disease. Also need to determine if the native right is salvageable via PCI.  I discussed the risks, benefits, alternatives and indications of right catheterization in detail as noted below. We also discussed CODE STATUS.  He indicates that he would like to be DO NOT RESUSCITATE, but agrees to resend for cath.

## 2016-09-15 NOTE — Assessment & Plan Note (Signed)
New diagnosis of cardiomyopathy. I think some of his symptoms may be related to heart failure. Tell the difference between lung disease related dyspnea and heart failure. He does not really notice PND or orthopnea suggesting that it may be more lung related, but need to exclude.  Plan: Left and right heart catheterization to determine tissue etiology and filling pressures.  He is already on stable dose of carvedilol and losartan. Unable to titrate further as by his blood pressure, because he has some orthostatic dizziness.   Is also on amlodipine could be titrated further if necessary.  Not currently on diuretic, but will determine filling pressures based on right heart catheterization findings.

## 2016-09-15 NOTE — Assessment & Plan Note (Signed)
Relatively stable on current medications including ARB, beta blocker and calcium channel blocker.

## 2016-09-15 NOTE — Assessment & Plan Note (Signed)
I had a long discussion with the patient and his daughter. He appears that he does have advanced directive form at home. His daughter indicates and he confirms that in the past she has indicated her desire for DO NOT RESUSCITATE status. They're not clear if he has a signed form at home however.  As a result of this we discussed periprocedural CODE STATUS, and he is okay with sending DO NOT RESUSCITATE for the purposes of performing cardiac catheterization.

## 2016-09-16 ENCOUNTER — Encounter: Payer: Self-pay | Admitting: Cardiology

## 2016-09-20 DIAGNOSIS — R0609 Other forms of dyspnea: Secondary | ICD-10-CM | POA: Diagnosis not present

## 2016-09-20 DIAGNOSIS — I10 Essential (primary) hypertension: Secondary | ICD-10-CM | POA: Diagnosis not present

## 2016-09-20 DIAGNOSIS — I2510 Atherosclerotic heart disease of native coronary artery without angina pectoris: Secondary | ICD-10-CM | POA: Diagnosis not present

## 2016-09-20 LAB — APTT: aPTT: 27 s (ref 22–34)

## 2016-09-20 LAB — CBC
HEMATOCRIT: 44.8 % (ref 38.5–50.0)
HEMOGLOBIN: 14.9 g/dL (ref 13.2–17.1)
MCH: 33.4 pg — ABNORMAL HIGH (ref 27.0–33.0)
MCHC: 33.3 g/dL (ref 32.0–36.0)
MCV: 100.4 fL — ABNORMAL HIGH (ref 80.0–100.0)
MPV: 10.4 fL (ref 7.5–12.5)
Platelets: 106 10*3/uL — ABNORMAL LOW (ref 140–400)
RBC: 4.46 MIL/uL (ref 4.20–5.80)
RDW: 14.8 % (ref 11.0–15.0)
WBC: 7 10*3/uL (ref 3.8–10.8)

## 2016-09-20 LAB — PROTIME-INR
INR: 1.1
Prothrombin Time: 11.9 s — ABNORMAL HIGH (ref 9.0–11.5)

## 2016-09-21 ENCOUNTER — Encounter (HOSPITAL_COMMUNITY)
Admission: RE | Disposition: A | Discharge status: Home / Self Care | Payer: Self-pay | Source: Ambulatory Visit | Attending: Cardiology

## 2016-09-21 ENCOUNTER — Ambulatory Visit (HOSPITAL_COMMUNITY)
Admission: RE | Admit: 2016-09-21 | Discharge: 2016-09-21 | Disposition: A | Discharge status: Home / Self Care | Payer: PPO | Source: Ambulatory Visit | Attending: Cardiology | Admitting: Cardiology

## 2016-09-21 DIAGNOSIS — I2510 Atherosclerotic heart disease of native coronary artery without angina pectoris: Secondary | ICD-10-CM | POA: Diagnosis not present

## 2016-09-21 DIAGNOSIS — I25810 Atherosclerosis of coronary artery bypass graft(s) without angina pectoris: Secondary | ICD-10-CM | POA: Diagnosis not present

## 2016-09-21 DIAGNOSIS — Z87442 Personal history of urinary calculi: Secondary | ICD-10-CM | POA: Insufficient documentation

## 2016-09-21 DIAGNOSIS — N189 Chronic kidney disease, unspecified: Secondary | ICD-10-CM | POA: Insufficient documentation

## 2016-09-21 DIAGNOSIS — Z951 Presence of aortocoronary bypass graft: Secondary | ICD-10-CM | POA: Diagnosis not present

## 2016-09-21 DIAGNOSIS — Z01818 Encounter for other preprocedural examination: Secondary | ICD-10-CM

## 2016-09-21 DIAGNOSIS — E039 Hypothyroidism, unspecified: Secondary | ICD-10-CM | POA: Insufficient documentation

## 2016-09-21 DIAGNOSIS — K219 Gastro-esophageal reflux disease without esophagitis: Secondary | ICD-10-CM | POA: Insufficient documentation

## 2016-09-21 DIAGNOSIS — J8410 Pulmonary fibrosis, unspecified: Secondary | ICD-10-CM | POA: Diagnosis present

## 2016-09-21 DIAGNOSIS — Z9981 Dependence on supplemental oxygen: Secondary | ICD-10-CM | POA: Diagnosis not present

## 2016-09-21 DIAGNOSIS — J9611 Chronic respiratory failure with hypoxia: Secondary | ICD-10-CM | POA: Diagnosis present

## 2016-09-21 DIAGNOSIS — I2584 Coronary atherosclerosis due to calcified coronary lesion: Secondary | ICD-10-CM | POA: Diagnosis not present

## 2016-09-21 DIAGNOSIS — J84112 Idiopathic pulmonary fibrosis: Secondary | ICD-10-CM | POA: Diagnosis not present

## 2016-09-21 DIAGNOSIS — I2582 Chronic total occlusion of coronary artery: Secondary | ICD-10-CM | POA: Diagnosis not present

## 2016-09-21 DIAGNOSIS — I252 Old myocardial infarction: Secondary | ICD-10-CM | POA: Diagnosis not present

## 2016-09-21 DIAGNOSIS — Z87891 Personal history of nicotine dependence: Secondary | ICD-10-CM | POA: Diagnosis not present

## 2016-09-21 DIAGNOSIS — Z800 Family history of malignant neoplasm of digestive organs: Secondary | ICD-10-CM | POA: Insufficient documentation

## 2016-09-21 DIAGNOSIS — K449 Diaphragmatic hernia without obstruction or gangrene: Secondary | ICD-10-CM | POA: Insufficient documentation

## 2016-09-21 DIAGNOSIS — Z66 Do not resuscitate: Secondary | ICD-10-CM | POA: Insufficient documentation

## 2016-09-21 DIAGNOSIS — I255 Ischemic cardiomyopathy: Secondary | ICD-10-CM | POA: Insufficient documentation

## 2016-09-21 DIAGNOSIS — Z8249 Family history of ischemic heart disease and other diseases of the circulatory system: Secondary | ICD-10-CM | POA: Insufficient documentation

## 2016-09-21 DIAGNOSIS — E785 Hyperlipidemia, unspecified: Secondary | ICD-10-CM | POA: Insufficient documentation

## 2016-09-21 DIAGNOSIS — I272 Pulmonary hypertension, unspecified: Secondary | ICD-10-CM | POA: Insufficient documentation

## 2016-09-21 DIAGNOSIS — I259 Chronic ischemic heart disease, unspecified: Secondary | ICD-10-CM | POA: Diagnosis present

## 2016-09-21 DIAGNOSIS — H269 Unspecified cataract: Secondary | ICD-10-CM | POA: Insufficient documentation

## 2016-09-21 DIAGNOSIS — Z86010 Personal history of colonic polyps: Secondary | ICD-10-CM | POA: Insufficient documentation

## 2016-09-21 DIAGNOSIS — Z7982 Long term (current) use of aspirin: Secondary | ICD-10-CM | POA: Insufficient documentation

## 2016-09-21 DIAGNOSIS — I129 Hypertensive chronic kidney disease with stage 1 through stage 4 chronic kidney disease, or unspecified chronic kidney disease: Secondary | ICD-10-CM | POA: Diagnosis not present

## 2016-09-21 DIAGNOSIS — R0609 Other forms of dyspnea: Secondary | ICD-10-CM

## 2016-09-21 DIAGNOSIS — Z882 Allergy status to sulfonamides status: Secondary | ICD-10-CM | POA: Diagnosis not present

## 2016-09-21 DIAGNOSIS — R9439 Abnormal result of other cardiovascular function study: Secondary | ICD-10-CM

## 2016-09-21 HISTORY — PX: CARDIAC CATHETERIZATION: SHX172

## 2016-09-21 LAB — POCT I-STAT 3, VENOUS BLOOD GAS (G3P V)
Bicarbonate: 25.8 mmol/L (ref 20.0–28.0)
O2 Saturation: 46 %
PCO2 VEN: 46.5 mmHg (ref 44.0–60.0)
TCO2: 27 mmol/L (ref 0–100)
pH, Ven: 7.353 (ref 7.250–7.430)
pO2, Ven: 27 mmHg — CL (ref 32.0–45.0)

## 2016-09-21 LAB — POCT I-STAT 3, ART BLOOD GAS (G3+)
ACID-BASE DEFICIT: 2 mmol/L (ref 0.0–2.0)
Bicarbonate: 24.1 mmol/L (ref 20.0–28.0)
O2 Saturation: 88 %
PO2 ART: 57 mmHg — AB (ref 83.0–108.0)
TCO2: 25 mmol/L (ref 0–100)
pCO2 arterial: 43.4 mmHg (ref 32.0–48.0)
pH, Arterial: 7.351 (ref 7.350–7.450)

## 2016-09-21 LAB — BASIC METABOLIC PANEL
BUN: 17 mg/dL (ref 7–25)
CO2: 26 mmol/L (ref 20–31)
Calcium: 9.5 mg/dL (ref 8.6–10.3)
Chloride: 105 mmol/L (ref 98–110)
Creat: 1.03 mg/dL (ref 0.70–1.11)
GLUCOSE: 94 mg/dL (ref 65–99)
POTASSIUM: 4.6 mmol/L (ref 3.5–5.3)
Sodium: 140 mmol/L (ref 135–146)

## 2016-09-21 SURGERY — RIGHT/LEFT HEART CATH AND CORONARY/GRAFT ANGIOGRAPHY
Anesthesia: LOCAL

## 2016-09-21 MED ORDER — HYDRALAZINE HCL 20 MG/ML IJ SOLN
INTRAMUSCULAR | Status: DC | PRN
  Administered 2016-09-21: 10 mg via INTRAVENOUS
  Administered 2016-09-21: 20 mg via INTRAVENOUS

## 2016-09-21 MED ORDER — IOPAMIDOL (ISOVUE-370) INJECTION 76%
INTRAVENOUS | Status: DC | PRN
Start: 2016-09-21 — End: 2016-09-21
  Administered 2016-09-21: 130 mL via INTRAVENOUS

## 2016-09-21 MED ORDER — MIDAZOLAM HCL 2 MG/2ML IJ SOLN
INTRAMUSCULAR | Status: AC
  Filled 2016-09-21: qty 2

## 2016-09-21 MED ORDER — SODIUM CHLORIDE 0.9% FLUSH: 3.0000 mL | INTRAVENOUS | Status: DC | PRN

## 2016-09-21 MED ORDER — IOPAMIDOL (ISOVUE-370) INJECTION 76%
INTRAVENOUS | Status: AC
  Filled 2016-09-21: qty 100

## 2016-09-21 MED ORDER — ONDANSETRON HCL 4 MG/2ML IJ SOLN: 4.0000 mg | Freq: Four times a day (QID) | INTRAMUSCULAR | Status: DC | PRN

## 2016-09-21 MED ORDER — SODIUM CHLORIDE 0.9 % IV SOLN
INTRAVENOUS | Status: DC
  Administered 2016-09-21: 09:00:00 via INTRAVENOUS

## 2016-09-21 MED ORDER — VERAPAMIL HCL 2.5 MG/ML IV SOLN
INTRAVENOUS | Status: AC
  Filled 2016-09-21: qty 2

## 2016-09-21 MED ORDER — HYDRALAZINE HCL 20 MG/ML IJ SOLN
INTRAMUSCULAR | Status: AC
  Filled 2016-09-21: qty 1

## 2016-09-21 MED ORDER — AMLODIPINE BESYLATE 5 MG PO TABS
10.0000 mg | ORAL_TABLET | Freq: Every day | ORAL | Status: DC
  Administered 2016-09-21: 10 mg via ORAL
  Filled 2016-09-21: qty 2

## 2016-09-21 MED ORDER — ASPIRIN 81 MG PO CHEW: 81.0000 mg | CHEWABLE_TABLET | ORAL | Status: DC

## 2016-09-21 MED ORDER — HEPARIN (PORCINE) IN NACL 2-0.9 UNIT/ML-% IJ SOLN
INTRAMUSCULAR | Status: AC
  Filled 2016-09-21: qty 1000

## 2016-09-21 MED ORDER — FENTANYL CITRATE (PF) 100 MCG/2ML IJ SOLN
INTRAMUSCULAR | Status: DC | PRN
  Administered 2016-09-21: 12.5 ug via INTRAVENOUS

## 2016-09-21 MED ORDER — LIDOCAINE HCL (PF) 1 % IJ SOLN
INTRAMUSCULAR | Status: AC
  Filled 2016-09-21: qty 30

## 2016-09-21 MED ORDER — SODIUM CHLORIDE 0.9 % IV SOLN: 250.0000 mL | INTRAVENOUS | Status: DC | PRN

## 2016-09-21 MED ORDER — MIDAZOLAM HCL 2 MG/2ML IJ SOLN
INTRAMUSCULAR | Status: DC | PRN
  Administered 2016-09-21: 0.5 mg via INTRAVENOUS

## 2016-09-21 MED ORDER — SODIUM CHLORIDE 0.9% FLUSH: 3.0000 mL | Freq: Two times a day (BID) | INTRAVENOUS | Status: DC

## 2016-09-21 MED ORDER — HEPARIN (PORCINE) IN NACL 2-0.9 UNIT/ML-% IJ SOLN
INTRAMUSCULAR | Status: DC | PRN
  Administered 2016-09-21: 1500 mL

## 2016-09-21 MED ORDER — IOPAMIDOL (ISOVUE-370) INJECTION 76%
INTRAVENOUS | Status: AC
  Filled 2016-09-21: qty 125

## 2016-09-21 MED ORDER — SODIUM CHLORIDE 0.9 % WEIGHT BASED INFUSION: 1.0000 mL/kg/h | INTRAVENOUS | Status: AC

## 2016-09-21 MED ORDER — ACETAMINOPHEN 325 MG PO TABS: 650.0000 mg | ORAL_TABLET | ORAL | Status: DC | PRN

## 2016-09-21 MED ORDER — AMLODIPINE BESYLATE 10 MG PO TABS: 10.0000 mg | ORAL_TABLET | Freq: Every day | ORAL | 3 refills | Status: DC

## 2016-09-21 MED ORDER — FENTANYL CITRATE (PF) 100 MCG/2ML IJ SOLN
INTRAMUSCULAR | Status: AC
  Filled 2016-09-21: qty 2

## 2016-09-21 SURGICAL SUPPLY — 19 items
CATH BALLN WEDGE 5F 110CM (CATHETERS) ×1 IMPLANT
CATH INFINITI 5FR AL1 (CATHETERS) ×2 IMPLANT
CATH INFINITI 5FR MULTPACK ANG (CATHETERS) ×1 IMPLANT
CATH SWAN GANZ 7F STRAIGHT (CATHETERS) ×1 IMPLANT
GLIDESHEATH SLEND SS 6F .021 (SHEATH) IMPLANT
GUIDEWIRE .025 260CM (WIRE) ×1 IMPLANT
KIT HEART LEFT (KITS) ×2 IMPLANT
KIT HEART RIGHT NAMIC (KITS) ×2 IMPLANT
PACK CARDIAC CATHETERIZATION (CUSTOM PROCEDURE TRAY) ×2 IMPLANT
PINNACLE LONG 5F 25CM (SHEATH) ×2
SHEATH FAST CATH BRACH 5F 5CM (SHEATH) ×1 IMPLANT
SHEATH INTROD PINNACLE 5F 25CM (SHEATH) IMPLANT
SHEATH PINNACLE 7F 10CM (SHEATH) ×1 IMPLANT
TRANSDUCER W/STOPCOCK (MISCELLANEOUS) ×4 IMPLANT
TUBING ART PRESS 72  MALE/FEM (TUBING) ×1
TUBING ART PRESS 72 MALE/FEM (TUBING) IMPLANT
TUBING CIL FLEX 10 FLL-RA (TUBING) ×2 IMPLANT
WIRE EMERALD 3MM-J .025X260CM (WIRE) ×1 IMPLANT
WIRE EMERALD 3MM-J .035X260CM (WIRE) ×1 IMPLANT

## 2016-09-21 NOTE — H&P (View-Only) (Signed)
PCP: [name], MD  Clinic Note: Chief Complaint  Patient presents with  . Follow-up    Chronic dyspnea, abnormal nuclear stress test and echocardiogram  . Dizziness    HPI: Chris Stokes is a 82 y.o. male with a Past Medical History notable for CAD-CABG as well as idiopathic pulmonary fibrosis (followed by Dr. Halford Chessman, on [place], with chronic exertional dyspnea) He is a former patient of Dr Warren Danes, who last saw him in February 2017.   Cardiac history:  Inferior STEMI 2004: Underwent CABG for multivessel CAD  Myoview in 2013: old inferolateral scar with no ischemia. EF 53%.  I saw him in September for routine follow-up, and because of his progressive dyspnea, we ordered a a Myoview stress test that showed no ischemia, but a large infarct and a very significantly reduced EF. I then ordered an echocardiogram  Studies Reviewed:   Myoview 08/31/2016  The left ventricular ejection fraction is severely decreased (<30%).  Nuclear stress EF: 28%. There is akinesis of the apical lateral, entire inferior and inferolateral walls.  There is a large in size, severe in intensity, fixed defect involving the entire inferior and inferolateral walls, apical and apical lateral walls consistent with prior infarct with no ischemia noted.  This is a high risk study due to severely reduced wall motion with segmental wall motion abnormalities consistent with prior infarct.  Echo 09/09/2016: Severely reduced LV function with EF 25-30%. Diffuse hypokinesis. GR 1 DD. Diastolic flattening of the septum. Mildly dilated aortic root. Moderate RV dilation with moderately reduced function. PA pressure roughly 50 mmHg.  Recent Hospitalizations: None  Interval History: Chris Stokes presents today for follow-up with his home oxygen. He is notably dyspneic with even talking. His company by his daughter. He really doesn't note any significant worsening of his exertional dyspnea as I last saw him,  but had noted some progressive decline over the last maybe 6 months his daughter indicates that he fell last fall, and has noted a week by week decline over the last 6 months with more pronounced dyspnea. He really doesn't complain of any chest tightness or pressure, but really does not do much walking. He wears oxygen all night long and mostly times a day. Sometimes takes it off while just sitting. His O2 requirement has gone up though recently from 2-3 L/m.  Denies any PND orthopnea, but does have mild edema.  Wears O2 all night & most of the time during the day.    No palpitations (cannot feel PVCs), lightheadedness, dizziness, weakness or syncope/near syncope. No TIA/amaurosis fugax symptoms. No melena, hematochezia, hematuria, or epstaxis. No claudication, but does note that his left leg will give out with him greater than his right when walking. This is associated with his dyspnea. Can only walk about 100 feet without getting short of breath.  ROS: A comprehensive was performed. Review of Systems  HENT: Positive for congestion (Constant need to clear his throat.). Negative for nosebleeds. Sore throat: constant drainage.   Eyes: Negative for blurred vision.  Respiratory: Positive for shortness of breath (Only on exertion; although even talking makes him breathless). Negative for cough (to clear his throat) and wheezing.        Can walk ~ 100 ft without stopping (on O2)  Cardiovascular: Positive for claudication (maybe - but does not walk enough) and leg swelling (just a little). Negative for chest pain, orthopnea (uses 2 pillows) and PND.       Occasional "heart skipping"  -  not really noticeable  Gastrointestinal: Negative for abdominal pain, blood in stool, diarrhea (only wiht Colitis flare), heartburn and melena.       No flare of colitis in last few months.  Genitourinary: Negative for hematuria.  Musculoskeletal: Positive for back pain (OA; h/o Sciatica).  Neurological: Positive for  dizziness and weakness (legs get weak after walking; L> R). Negative for loss of consciousness and headaches.  Endo/Heme/Allergies:       Cold intolerant - several years  Psychiatric/Behavioral: Negative for memory loss. The patient is not nervous/anxious and does not have insomnia.   All other systems reviewed and are negative.   Past Medical History:  Diagnosis Date  . Cataract 1999   bilateral cataracts   . Chronic kidney disease    multiple kidney stone flare ups  . Colon polyps    adenomatous  . Coronary artery disease, occlusive    Dr. Mare Ferrari; Cath 02/2003 for Inf STEMI -> dLM CAD with moderate to severe LAD, RI & Cx stenoses, subtotal RCA.--> CABG x 4: LIMA-LAD, SVG-RI, SVG-rPDA-rPL  . GERD (gastroesophageal reflux disease) 2012  . Hiatal hernia   . Hyperlipidemia   . Hypertension   . Hypothyroidism   . Pulmonary fibrosis ([place]) 12/08/2012   on Home O2 (asbestos exposure); Dr. Halford Chessman  . Rectal fissure   . Seasonal allergies   . ST elevation myocardial infarction (STEMI) of inferior wall (HCC) 1984   Inferior MI  . Subsequent ST elevation (STEMI) myocardial infarction of inferior wall 02/2003   Subtotal RCA occlusion -- MV CAD on Cath --> CABG x 4  . Ulcerative colitis    Dr Henrene Pastor    Past Surgical History:  Procedure Laterality Date  . ABDOMINAL AORTIC ANEURYSM REPAIR  1992  . CARDIAC CATHETERIZATION  02/2003   Inferior STEMI: dLM 60-70%; LAD heavy Ca2+ - mLAD 70% after D2, small pD1 60-70%, Ostial sm-mod D2 60-70%, Mod pRI 60%, Mod pCx 60%; Large/Dominant RCA - ectatic & severely diseased throughout with ectasia/stenoses & thrombus sub-totalled pre-PDA &rPL. --> Referred for CABG  . COLONOSCOPY    . CORONARY ARTERY BYPASS GRAFT  2004   4 vessel bypass: LIMA-LAD, SVG-RI, SVG-rPDA-rPL; unable to bypass the small Cx-OM vessels  . NM MYOVIEW LTD  09/2012   There is evidence of a small/moderate scar affecting the inferior wall in the inferolateral wall. There may be slight  peri-infarct ischemia. LV Ejection Fraction: 53%.  There is mild hypokinesis of the septum and the apex.  [name] NM MYOVIEW LTD  08/31/2016   Severely reduced LVEF of 28%. Akinesis of the apical lateral and entire inferior-inferolateral wall - consistent with large size severe intensity fixed defect indicative the entire inferior and inferolateral walls as well as apical lateral walls. No evidence of ischemia. Read as HIGH RISK because of low EF.  [name] POLYPECTOMY    . TRANSTHORACIC ECHOCARDIOGRAM  03/2005   Normal LV function with no regional wall motion abnormality.. GR 1 DD. Mild aortic sclerosis without stenosis.  . TRANSTHORACIC ECHOCARDIOGRAM  09/09/2016   Severely reduced LV function with EF 25-30%. Diffuse hypokinesis. GR 1 DD. Diastolic flattening of the septum. Mildly dilated aortic root. Moderate RV dilation with moderately reduced function. PA pressure roughly 50 mmHg.    Prior to Admission medications   Medication Sig Start Date End Date Taking? Authorizing Provider  allopurinol (ZYLOPRIM) 100 MG tablet TAKE TWO TABLETS BY MOUTH ONCE DAILY 03/22/16  Yes Evie Lacks Plotnikov, MD  amLODipine (NORVASC) 5 MG tablet TAKE ONE  TABLET BY MOUTH ONCE DAILY 03/09/16  Yes Evie Lacks Plotnikov, MD  aspirin 81 MG tablet Take 81 mg by mouth daily.     Yes Historical Provider, MD  calcium carbonate (TUMS - DOSED IN MG ELEMENTAL CALCIUM) 500 MG chewable tablet Chew 1 tablet by mouth daily as needed for indigestion or heartburn. Reported on 01/27/2016   Yes Historical Provider, MD  carvedilol (COREG) 25 MG tablet TAKE ONE-HALF TABLET BY MOUTH TWICE DAILY WITH A MEAL 03/22/16  Yes Cassandria Anger, MD  Cholecalciferol (VITAMIN D3) 2000 UNITS TABS Take 1 tablet by mouth 2 (two) times daily.     Yes Historical Provider, MD  donepezil (ARICEPT) 10 MG tablet TAKE ONE TABLET BY MOUTH AT BEDTIME 03/09/16  Yes Evie Lacks Plotnikov, MD  Fexofenadine HCl (KLS ALLER-FEX PO) Take by mouth. 120 mg take one tablet by mouth daily     Yes Historical Provider, MD  folic acid (FOLVITE) Q000111Q MCG tablet Take 800 mcg by mouth daily.     Yes Historical Provider, MD  gabapentin (NEURONTIN) 300 MG capsule Take 300 mg by mouth every morning. Take 600mg  by mouth every evening   Yes Historical Provider, MD  Garlic [id number] MG CAPS Take 1 capsule by mouth 2 (two) times daily. Reported on 01/27/2016   Yes Historical Provider, MD  ibuprofen (ADVIL,MOTRIN) 200 MG tablet Take 400 mg by mouth 2 (two) times daily as needed for moderate pain. Reported on 01/27/2016   Yes Historical Provider, MD  ipratropium (ATROVENT) 0.06 % nasal spray Place 2 sprays into the nose 4 (four) times daily as needed for rhinitis (runny nose). 02/15/13  Yes Cassandria Anger, MD  levothyroxine (SYNTHROID, LEVOTHROID) 137 MCG tablet TAKE ONE TABLET BY MOUTH ONCE DAILY BEFORE BREAKFAST 03/09/16  Yes Evie Lacks Plotnikov, MD  losartan (COZAAR) 100 MG tablet TAKE ONE TABLET BY MOUTH ONCE DAILY 03/09/16  Yes Cassandria Anger, MD  Mesalamine 800 MG TBEC TAKE TWO TABLETS BY MOUTH THREE TIMES DAILY 07/06/16  Yes Irene Shipper, MD  Omega-3 Fatty Acids (FISH OIL) 1000 MG CAPS Take 1,000 mg by mouth daily.    Yes Historical Provider, MD  omeprazole (PRILOSEC) 20 MG capsule Take 20 mg by mouth daily as needed (heartburn). Reported on 01/27/2016   Yes Historical Provider, MD  OVER THE COUNTER MEDICATION Liquid Life 1 oz. daily   Yes Historical Provider, MD  primidone (MYSOLINE) 50 MG tablet TAKE ONE TABLET BY MOUTH THREE TIMES DAILY FOR  TREMOR 03/22/16  Yes Evie Lacks Plotnikov, MD  traMADol (ULTRAM) 50 MG tablet Take 50-100 mg by mouth every 12 (twelve) hours as needed for moderate pain or severe pain.   Yes Historical Provider, MD    Allergies  Allergen Reactions  . Lipitor [Atorvastatin]     cramps  . Ramipril     cough  . Simvastatin     REACTION: myalgia  . Sulfonamide Derivatives     REACTION: rash    Social History   Social History  . Marital status: Married    Spouse  name: N/A  . Number of children: 2  . Years of education: N/A   Occupational History  . retired   .     Social History Main Topics  . Smoking status: Former Smoker    Packs/day: 2.00    Years: 25.00    Types: Cigarettes    Quit date: 11/29/1966  . Smokeless tobacco: Never Used  . Alcohol use No  . Drug use:  No  . Sexual activity: No   Other Topics Concern  . None   Social History Narrative  . None    family history includes Arthritis/Rheumatoid in his mother; Colon cancer in his cousin; Emphysema in his brother and father; Hyperlipidemia in his brother, father, and sister; Hypertension in his father and other; Lymphoma in his sister.   Wt Readings from Last 3 Encounters:  09/15/16 82.1 kg (181 lb)  08/31/16 80.7 kg (178 lb)  08/05/16 81.2 kg (179 lb)    PHYSICAL EXAM BP 140/88   Pulse 61   Ht 5\' 10"  (1.778 m)   Wt 82.1 kg (181 lb)   BMI 25.97 kg/m  General appearance: alert, cooperative, appears stated age, no distress and Nourished, well groomed. Somewhat frail with unsteady gait using home oxygen.Sitting in a wheelchair HEENT: [place]/AT, EOMI, MMM, anicteric sclera Neck: no adenopathy, no carotid bruit and no JVD Lungs: Dry rhonchi and crackles. No true rales., normal percussion bilaterally and non-labored Heart: regular rate and rhythm with ectopy, S1 normal, split S2, no murmur, click, rub or gallop ; nondisplaced PMI Abdomen: soft, non-tender; bowel sounds normal; no masses,  no organomegaly; no HJR Extremities: extremities normal, atraumatic, no cyanosis, or edema  Pulses: 2+ and symmetric;  Skin: Warm dry and intact. No rashes. Neurologic: Mental status: Alert, oriented, thought content appropriate; Cranial nerves: normal (II-XII grossly intact) Psych: Normal mood and affect.    Adult ECG Report  Rate: 77 ;  Rhythm: normal sinus rhythm, premature ventricular contractions (PVC) and 1 AV block (PR interval 242); left axis deviation (-35). Inferior infarct, age  undetermined. Inferior T waves inversions -- chronic. possible left atrial.  Narrative Interpretation: Relatively stable EKG when compared to 2016.   Other studies Reviewed: Additional studies/ records that were reviewed today include:  Recent Labs:   Lab Results  Component Value Date   CHOL 211 (H) 07/30/2016   HDL 38 (L) 07/30/2016   LDLCALC 156 (H) 07/30/2016   TRIG 85 07/30/2016   CHOLHDL 5.6 (H) 07/30/2016    ASSESSMENT / PLAN: Problem List Items Addressed This Visit    IPF (idiopathic pulmonary fibrosis) (HCC) (Chronic)    On home oxygen on 3 L. Echocardiogram findings concerning for elevated PA pressures. Will assess with right heart catheterization.      Hyperlipidemia with target LDL less than 70; Statin intolerant (Chronic)    Has been documented to be statin intolerant in the past. Recheck his lipids last month and LDL is well out of perspective goal. Depending on the results the cardiac catheterization we may need to think about considering a different option for lipid lowering. This would be in conjunction with discussion about his long-term desires.      Essential hypertension (Chronic)    Relatively stable on current medications including ARB, beta blocker and calcium channel blocker.      Relevant Orders   APTT   Protime-INR   CBC   Basic metabolic panel   LEFT AND RIGHT HEART CATHETERIZATION WITH CORONARY ANGIOGRAM   DOE (dyspnea on exertion)   Relevant Orders   APTT   Protime-INR   CBC   Basic metabolic panel   LEFT AND RIGHT HEART CATHETERIZATION WITH CORONARY ANGIOGRAM   Coronary artery disease involving native heart without angina pectoris - Primary (Chronic)    Extensive CAD history now with a large fixed defect in the upper wall that is probably a new finding from his prior nuclear stress test. LVEF is also reduced. Plan:  Left and Right Heart Catheterization He is on ARB, beta blocker and aspirin. Not on statin because of intolerance.       Relevant Orders   APTT   Protime-INR   CBC   Basic metabolic panel   LEFT AND RIGHT HEART CATHETERIZATION WITH CORONARY ANGIOGRAM   Cardiomyopathy, ischemic    New diagnosis of cardiomyopathy. I think some of his symptoms may be related to heart failure. Tell the difference between lung disease related dyspnea and heart failure. He does not really notice PND or orthopnea suggesting that it may be more lung related, but need to exclude.  Plan: Left and right heart catheterization to determine tissue etiology and filling pressures.  He is already on stable dose of carvedilol and losartan. Unable to titrate further as by his blood pressure, because he has some orthostatic dizziness.   Is also on amlodipine could be titrated further if necessary.  Not currently on diuretic, but will determine filling pressures based on right heart catheterization findings.      Advance directive indicates patient wish for do-not-resuscitate status (Chronic)    I had a long discussion with the patient and his daughter. He appears that he does have advanced directive form at home. His daughter indicates and he confirms that in the past she has indicated her desire for DO NOT RESUSCITATE status. They're not clear if he has a signed form at home however.  As a result of this we discussed periprocedural CODE STATUS, and he is okay with sending DO NOT RESUSCITATE for the purposes of performing cardiac catheterization.      Abnormal nuclear stress test    Significantly reduced EF with a very large inferior inferolateral scar. This seems to be a much larger infarct with a significantly reduced EF when compared to the 2013 Myoview. Reduced EF is confirmed by echocardiogram, but no wall motion changes were noted on echo.  With his increased oxygen requirement and worsening dyspnea, cannot exclude a component of CAD/ischemic cardiomyopathy related heart failure symptoms exacerbating his artery existing pulmonary disease.   He had elevated PA pressures on echo to suggest pulmonary hypertension that may potentially be related to his lung disease but cannot exclude relation to cardiomyopathy.  Plan: Based on these abnormal results with severely reduced EF, plan is to perform left and right heart catheterization via left radial and brachial approach. I suspect that we will find an occluded vein graft to the right, but need to exclude other existing disease. Also need to determine if the native right is salvageable via PCI.  I discussed the risks, benefits, alternatives and indications of right catheterization in detail as noted below. We also discussed CODE STATUS.  He indicates that he would like to be DO NOT RESUSCITATE, but agrees to resend for cath.       Other Visit Diagnoses    Pre-op testing           Performing MD:  Glenetta Hew, M.D., M.S.  Procedure:  Left and Right Heart Catheterization with Native Coronary and Graft Angiography and Possible for His Coronary Intervention  The procedure with Risks/Benefits/Alternatives and Indications was reviewed with the patient and his daughter.  All questions were answered.    Risks / Complications include, but not limited to: Death, MI, CVA/TIA, VF/VT (with defibrillation), Bradycardia (need for temporary pacer placement), contrast induced nephropathy, bleeding / bruising / hematoma / pseudoaneurysm, vascular or coronary injury (with possible emergent CT or Vascular Surgery), adverse medication reactions, infection.  Additional  risks involving the use of radiation with the possibility of radiation burns and cancer were explained in detail. The additional risk of vein graft intervention was discussed.  The patient and his daughter voice understanding and agree to proceed.     Current medicines are reviewed at length with the patient today. (+/- concerns): n/a The following changes have been made: none  Studies Ordered:   Orders Placed This Encounter    Procedures  . APTT  . Protime-INR  . CBC  . Basic metabolic panel  . LEFT AND RIGHT HEART CATHETERIZATION WITH CORONARY ANGIOGRAM   Your physician wants you to follow-up in: [place] DR Ellyn Hack- 30 MIN    Glenetta Hew, M.D., M.S. Interventional Cardiologist   Pager # [phone] Phone # [phone] [address]. [place], [place] [address]

## 2016-09-21 NOTE — Discharge Instructions (Signed)
Groin Surgical Site Care °Refer to this sheet in the next few weeks. These instructions provide you with information about caring for yourself after your procedure. Your health care provider may also give you more specific instructions. Your treatment has been planned according to current medical practices, but problems sometimes occur. Call your health care provider if you have any problems or questions after your procedure. °WHAT TO EXPECT AFTER THE PROCEDURE °After your procedure, it is typical to have the following: °· Bruising at the groin site that usually fades within 1-2 weeks. °· Blood collecting in the tissue (hematoma) that may be painful to the touch. It should usually decrease in size and tenderness within 1-2 weeks. °HOME CARE INSTRUCTIONS °· Take medicines only as directed by your health care provider. °· You may shower 24-48 hours after the procedure or as directed by your health care provider. Remove the bandage (dressing) and gently wash the site with plain soap and water. Pat the area dry with a clean towel. Do not rub the site, because this may cause bleeding. °· Do not take baths, swim, or use a hot tub until your health care provider approves. °· Check your insertion site every day for redness, swelling, or drainage. °· Do not apply powder or lotion to the site. °· Limit use of stairs to twice a day for the first 2-3 days or as directed by your health care provider. °· Do not squat for the first 2-3 days or as directed by your health care provider. °· Do not lift over 10 lb (4.5 kg) for 5 days after your procedure or as directed by your health care provider. °· Ask your health care provider when it is okay to: °¨ Return to work or school. °¨ Resume usual physical activities or sports. °¨ Resume sexual activity. °· Do not drive home if you are discharged the same day as the procedure. Have someone else drive you. °· You may drive 24 hours after the procedure unless otherwise instructed by your  health care provider. °· Do not operate machinery or power tools for 24 hours after the procedure or as directed by your health care provider. °· If your procedure was done as an outpatient procedure, which means that you went home the same day as your procedure, a responsible adult should be with you for the first 24 hours after you arrive home. °· Keep all follow-up visits as directed by your health care provider. This is important. °SEEK MEDICAL CARE IF: °· You have a fever. °· You have chills. °· You have increased bleeding from the groin site. Hold pressure on the site. °SEEK IMMEDIATE MEDICAL CARE IF: °· You have unusual pain at the groin site. °· You have redness, warmth, or swelling at the groin site. °· You have drainage (other than a small amount of blood on the dressing) from the groin site. °· The groin site is bleeding, and the bleeding does not stop after 30 minutes of holding steady pressure on the site. °· Your leg or foot becomes pale, cool, tingly, or numb. °  °This information is not intended to replace advice given to you by your health care provider. Make sure you discuss any questions you have with your health care provider. °  °Document Released: 07/19/2014 Document Reviewed: 07/19/2014 °Elsevier Interactive Patient Education ©2016 [employer] ° °

## 2016-09-21 NOTE — Interval H&P Note (Signed)
History and Physical Interval Note:  09/21/2016 9:21 AM  Avid Vanepps  has presented today for surgery, with the diagnosis of CAD-Cardiomyopathy with Abnormal Nuclear ST.   The various methods of treatment have been discussed with the patient and family. After consideration of risks, benefits and other options for treatment, the patient has consented to  Procedure(s): Right/Left Heart Cath and Coronary/Graft Angiography (N/A) with possible Percutaneous Coronary Intervention as a surgical intervention .  The patient's history has been reviewed, patient examined, no change in status, stable for surgery.  I have reviewed the patient's chart and labs.  Questions were answered to the patient's satisfaction.    Cath Lab Visit (complete for each Cath Lab visit)  Clinical Evaluation Leading to the Procedure:   ACS: No.  Non-ACS:    Anginal Classification: CCS IV - CHF/Angina Sx   Anti-ischemic medical therapy: Maximal Therapy (2 or more classes of medications)  Non-Invasive Test Results: High-risk stress test findings: cardiac mortality >3%/year  Prior CABG: Previous CABG   Glenetta Hew

## 2016-09-21 NOTE — Progress Notes (Signed)
Stephannie Peters from cath lab came to check right groin no bleeding or hematoma noted

## 2016-09-21 NOTE — Progress Notes (Signed)
Order for sheath removal verified per post procedural orders. Procedure explained to patient and Rt femoral artery and Rt. Femoral vein access sites assessed: level 0, palpable dorsalis pedis and posterior tibial pulses. 5 [place] Sheath removed from RFA and 7 [place] Sheath removed from RFV.  Manual pressure applied for 20 minutes. Pre, peri, & post procedural vitals: HR 65, RR 12, O2 Sat upper 90, BP 149/68, Pain 0. Distal pulses remained intact after sheath removal. Access site level 0 and dressed with 4X4 gauze and tegaderm.  Chris Stokes RCIS confirmed condition of site. Post procedural instructions discussed and return demonstration from patient.  Bedrest start @ 1225, ends @ 1625.

## 2016-09-22 ENCOUNTER — Encounter (HOSPITAL_COMMUNITY): Payer: Self-pay | Admitting: Cardiology

## 2016-09-22 MED FILL — Verapamil HCl IV Soln 2.5 MG/ML: INTRAVENOUS | Qty: 2 | Status: AC

## 2016-09-22 MED FILL — Lidocaine HCl Local Preservative Free (PF) Inj 1%: INTRAMUSCULAR | Qty: 30 | Status: AC

## 2016-10-01 ENCOUNTER — Telehealth: Payer: Self-pay

## 2016-10-01 NOTE — Telephone Encounter (Signed)
FMLA paperwork received on pt's daughter behalf. Completed and placed on MD's desk for signature

## 2016-10-04 ENCOUNTER — Ambulatory Visit (INDEPENDENT_AMBULATORY_CARE_PROVIDER_SITE_OTHER): Payer: PPO

## 2016-10-04 DIAGNOSIS — Z111 Encounter for screening for respiratory tuberculosis: Secondary | ICD-10-CM

## 2016-10-06 ENCOUNTER — Telehealth: Payer: Self-pay | Admitting: Emergency Medicine

## 2016-10-06 NOTE — Telephone Encounter (Signed)
Pt daughter dropped off a packet and FL2 papers. She is coming into the office tomorrow and wants to know if those would be ready for her to pick up. She has a meeting and needs to take those papers with her. Please advise thanks.

## 2016-10-06 NOTE — Telephone Encounter (Signed)
Forms are in your Red folder for completion.

## 2016-10-07 ENCOUNTER — Encounter: Payer: Self-pay | Admitting: General Practice

## 2016-10-07 LAB — TB SKIN TEST
Induration: 0 mm
TB Skin Test: NEGATIVE

## 2016-10-07 NOTE — Telephone Encounter (Signed)
Paperwork completed, signed, copy given to patient, copy sent to scan

## 2016-10-12 ENCOUNTER — Telehealth: Payer: Self-pay | Admitting: Emergency Medicine

## 2016-10-12 DIAGNOSIS — J9611 Chronic respiratory failure with hypoxia: Secondary | ICD-10-CM | POA: Diagnosis not present

## 2016-10-12 DIAGNOSIS — J84112 Idiopathic pulmonary fibrosis: Secondary | ICD-10-CM | POA: Diagnosis not present

## 2016-10-12 DIAGNOSIS — J849 Interstitial pulmonary disease, unspecified: Secondary | ICD-10-CM | POA: Diagnosis not present

## 2016-10-12 NOTE — Telephone Encounter (Signed)
It is very difficult to get via [facility] He needs FTF visit They can contact [employer] for the requirements Thx

## 2016-10-12 NOTE — Telephone Encounter (Signed)
Pts daughter called and they moved the patient into assistant living 10/11/16. They are wondering if they can get him a motorized scooter. What steps do they need to take. Please advise thanks.

## 2016-10-13 NOTE — Telephone Encounter (Signed)
Pt's daughter [name] informed.

## 2016-10-13 NOTE — Telephone Encounter (Signed)
They will discuss this further with PCP on 11/01/16 OV.

## 2016-10-14 ENCOUNTER — Telehealth: Payer: Self-pay | Admitting: Emergency Medicine

## 2016-10-14 NOTE — Telephone Encounter (Signed)
Pts daughter called and stated she dropped some fors off to be filled out. They have been sent back twice and were incomplete. She asked that you call her back so she can get some of the incomplete information. Please advise thanks.

## 2016-10-15 NOTE — Telephone Encounter (Signed)
Attending physician statement received, completed, and faxed back to daughter Chris Stokes.  Copy of updated form sent to scan

## 2016-10-15 NOTE — Telephone Encounter (Signed)
Original paperwork has been sent to scan.  Daughter contacted and will fax forms to my ATTN to B side fax so that I can complete forms and fax back,

## 2016-10-15 NOTE — Telephone Encounter (Signed)
Attending physician's statement is not complete.  3 parts are missing 1. Name of referral to any specialists. 2. Date of LOV and reason. 3. Diagnosis.  Dahlia- do you have access to a copy so we can update?

## 2016-10-19 DIAGNOSIS — N3944 Nocturnal enuresis: Secondary | ICD-10-CM | POA: Diagnosis not present

## 2016-10-19 DIAGNOSIS — N3941 Urge incontinence: Secondary | ICD-10-CM | POA: Diagnosis not present

## 2016-10-25 ENCOUNTER — Ambulatory Visit (INDEPENDENT_AMBULATORY_CARE_PROVIDER_SITE_OTHER): Payer: PPO | Admitting: Cardiology

## 2016-10-25 ENCOUNTER — Encounter: Payer: Self-pay | Admitting: Cardiology

## 2016-10-25 ENCOUNTER — Ambulatory Visit: Payer: PPO | Admitting: Cardiology

## 2016-10-25 VITALS — BP 154/84 | HR 58 | Ht 70.5 in | Wt 184.4 lb

## 2016-10-25 DIAGNOSIS — E875 Hyperkalemia: Secondary | ICD-10-CM

## 2016-10-25 DIAGNOSIS — J9611 Chronic respiratory failure with hypoxia: Secondary | ICD-10-CM

## 2016-10-25 DIAGNOSIS — I10 Essential (primary) hypertension: Secondary | ICD-10-CM

## 2016-10-25 DIAGNOSIS — I2510 Atherosclerotic heart disease of native coronary artery without angina pectoris: Secondary | ICD-10-CM

## 2016-10-25 DIAGNOSIS — I259 Chronic ischemic heart disease, unspecified: Secondary | ICD-10-CM

## 2016-10-25 DIAGNOSIS — Z5189 Encounter for other specified aftercare: Secondary | ICD-10-CM

## 2016-10-25 DIAGNOSIS — I5042 Chronic combined systolic (congestive) and diastolic (congestive) heart failure: Secondary | ICD-10-CM

## 2016-10-25 DIAGNOSIS — I255 Ischemic cardiomyopathy: Secondary | ICD-10-CM

## 2016-10-25 DIAGNOSIS — E785 Hyperlipidemia, unspecified: Secondary | ICD-10-CM | POA: Diagnosis not present

## 2016-10-25 MED ORDER — FUROSEMIDE 20 MG PO TABS: 20.0000 mg | ORAL_TABLET | Freq: Every day | ORAL | 3 refills | Status: DC | PRN

## 2016-10-25 MED ORDER — SPIRONOLACTONE 25 MG PO TABS: 12.5000 mg | ORAL_TABLET | Freq: Every day | ORAL | 3 refills | Status: DC

## 2016-10-25 NOTE — Patient Instructions (Signed)
START SPIROLACTONE 12.5 MG ONCE DAILY  Sliding scale Lasix: Weigh yourself  Daily in the Morning. Your dry weight will be what your scale says on the day you return home.(here is 184 lbs.).   If you gain more than 3 pounds from dry weight: TAKE the Lasix dosing to 40 mg in the morning a until weight returns to baseline dry weight  PLEASE HAVE LABS DONE Monday - MAY HAVE PRIMARY TO DO ,IF NOT HAVE [facility]ISTANT [facility] TO DRAW LABS  Your physician wants you to follow-up in: [place] HARDINGYou will receive a reminder letter in the mail two months in advance. If you don't receive a letter, please call our office to schedule the follow-up appointment.

## 2016-10-25 NOTE — Progress Notes (Signed)
PCP: [name], MD  Clinic Note: Chief Complaint  Patient presents with  . Follow-up    pt statessome SOB no chest pain   . Coronary Artery Disease  . Shortness of Breath    chronic O2.  ILD with Pulm HTN    HPI: Chris Stokes is a 82 y.o. male with a PMH below who presents today for Post-cath follow-up.  Chris Stokes was last seen on October 18 partners precath evaluation for his reduced ejection fraction and worsening dyspnea.  Recent Hospitalizations: For his catheterization  Studies Reviewed:  Right Left Heart Cath 09/21/2016:   Hemodynamic findings consistent with severe pulmonary hypertension - mixed with moderate we elevated LVEDP in setting of severe systemic hypertension  There is severe left ventricular systolic dysfunction - reported roughly 30-35% by echo with inferior hypokinesis.  LV end diastolic pressure is moderately elevated.  Ost RPDA to RPDA lesion, 100 %stenosed.  Ost RCA to Dist RCA lesion, 100 %stenosed. Dist RCA lesion, 90 %stenosed.  Seq SVG-rPDA-rPL and is normal in caliber. The initial limb to the PDA is widely patent, however Distal limb of SVG-rPDA-rPL Graft to rPL Insertion lesion, 90 %stenosed. The upstream PL also has disease.  Post Atrio lesion, 50 %stenosed. 2nd RPLB lesion, 80 %stenosed.  RPDA lesion, 90 %stenosed distal to the anastomosis of the vein graft. The distal vessels fill via collaterals  Ost LM to LM lesion, 75 %stenosed - new lesion. LM lesion, 65 %stenosed - original lesion.  Prox LAD lesion, 90 %stenosed. Mid LAD lesion, 100 %stenosed. distal LAD fills via LIMA  LIMA-LAD is normal in caliber and anatomically normal. Distal LAD has mild diffuse disease and is a wraparound LAD.  SVG-RI: Origin to Prox Graft lesion, 100 %stenosed.  Ramus lesion, 70 %stenosed. Minimal disease past the lesion  Ost 1st Mrg to 1st Mrg lesion, 70 %stenosed.    Severe native coronary disease with now progression of left main disease to  have an ostial lesion. The circumflex and ramus intermedius are in jeopardy based on the left main lesions and the LIMA-LAD is patent. The entire RCA is occluded. The vein graft to the PDA-PL has severe anastomotic disease in the PL fill be a very difficult to reach branch point. Additionally the PL beyond the graft has moderate to min severe disease in the major branches. Minimal distal runoff.  Potential PCI options would be distal vein graft anastomoses into the PL which would be a very difficult procedure with low likelihood of long-term success. Other option would be rotational atherectomy of the left main into the ramus with provisional stenting of the ostial circumflex and the proximal OM1.  Given his underlying lung disease with severe pulmonary hypertension, medical management is probably the best option.  I will review with interventional colleagues, however I doubt there is much to offer from an interventional standpoint  Plan:  Continue aggressive cardiac myopathy management: We will increase amlodipine dose to 10 mg based on his systemic hypertension.  Can further address diuretics and afterload reduction in the outpatient setting.  He will follow me in 1-2 weeks to discuss findings and recommendations.  . Interval History: Mr. Chris Stokes comes today for follow-up. He is in a wheelchair combination gets extremely dyspneic walking too far. He is on chronic oxygen. In the interim since I last saw him he and his wife moved into an assisted living facility. He really has not had no significant change in overall symptomatology. Breathing maybe a little bit better. We  had added amlodipine after his cardiac catheterization.  She still has significant exertional dyspnea symptoms but nothing worse than it was a month ago. He is still on 2-3 L of oxygen and is pretty much limited to walking around the room only. Now that he is in assisted living, his medications are being administered by the  nursing staff. He has not had any recent falls. He denies any resting or exertional angina symptoms. No PND orthopnea but does have mild stable edema.  No palpitations (cannot feel PVCs), lightheadedness, dizziness, weakness or syncope/near syncope. No TIA/amaurosis fugax symptoms. No melena, hematochezia, hematuria, or epstaxis. No claudication, but does note that his left leg will give out with him greater than his right when walking. This is associated with his dyspnea. Can only walk about 100 feet without getting short of breath.  ROS: A comprehensive was performed. ROS HENT: Positive for congestion (Constant need to clear his throat.). Negative for nosebleeds. Sore throat: constant drainage.   Eyes: Negative for blurred vision.  Respiratory: Positive for shortness of breath (Only on exertion; although even talking makes him breathless). Negative for cough (to clear his throat) and wheezing.        Can walk ~ 100 ft without stopping (on O2)  Cardiovascular: Positive for claudication (maybe - but does not walk enough) and leg swelling (just a little). Negative for chest pain, orthopnea (uses 2 pillows) and PND.       Occasional "heart skipping"  - not really noticeable  Gastrointestinal: Negative for abdominal pain, blood in stool, diarrhea (only wiht Colitis flare), heartburn and melena.       No flare of colitis in last few months.  Genitourinary: Negative for hematuria.  Musculoskeletal: Positive for back pain (OA; h/o Sciatica).  Neurological: Positive for dizziness and weakness (legs get weak after walking; L> R). Negative for loss of consciousness and headaches.  Endo/Heme/Allergies:       Cold intolerant - several years  Psychiatric/Behavioral: Negative for memory loss. The patient is not nervous/anxious and does not have insomnia.   All other systems reviewed and are negative.  Past Medical History:  Diagnosis Date  . Cataract 1999   bilateral cataracts   . Chronic kidney  disease    multiple kidney stone flare ups  . Colon polyps    adenomatous  . Coronary artery disease, occlusive    Dr. Mare Ferrari; Cath 02/2003 for Inf STEMI -> dLM CAD with moderate to severe LAD, RI & Cx stenoses, subtotal RCA.--> CABG x 4: LIMA-LAD, SVG-RI, SVG-rPDA-rPL  . GERD (gastroesophageal reflux disease) 2012  . Hiatal hernia   . Hyperlipidemia   . Hypertension   . Hypothyroidism   . Pulmonary fibrosis ([place]) 12/08/2012   on Home O2 (asbestos exposure); Dr. Halford Chessman  . Rectal fissure   . Seasonal allergies   . ST elevation myocardial infarction (STEMI) of inferior wall (HCC) 1984   Inferior MI  . Subsequent ST elevation (STEMI) myocardial infarction of inferior wall 02/2003   Subtotal RCA occlusion -- MV CAD on Cath --> CABG x 4  . Ulcerative colitis    Dr Henrene Pastor    Past Surgical History:  Procedure Laterality Date  . ABDOMINAL AORTIC ANEURYSM REPAIR  1992  . CARDIAC CATHETERIZATION  02/2003   Inferior STEMI: dLM 60-70%; LAD heavy Ca2+ - mLAD 70% after D2, small pD1 60-70%, Ostial sm-mod D2 60-70%, Mod pRI 60%, Mod pCx 60%; Large/Dominant RCA - ectatic & severely diseased throughout with ectasia/stenoses & thrombus sub-totalled pre-PDA &  rPL. --> Referred for CABG  . CARDIAC CATHETERIZATION N/A 09/21/2016   Procedure: Right/Left Heart Cath and Coronary/Graft Angiography;  Surgeon: Leonie Man, MD;  Location: [facility];  Service: Cardiovascular;  Laterality: N/A;  . COLONOSCOPY    . CORONARY ARTERY BYPASS GRAFT  2004   4 vessel bypass: LIMA-LAD, SVG-RI, SVG-rPDA-rPL; unable to bypass the small Cx-OM vessels  . NM MYOVIEW LTD  09/2012   There is evidence of a small/moderate scar affecting the inferior wall in the inferolateral wall. There may be slight peri-infarct ischemia. LV Ejection Fraction: 53%.  There is mild hypokinesis of the septum and the apex.  [name] NM MYOVIEW LTD  08/31/2016   Severely reduced LVEF of 28%. Akinesis of the apical lateral and entire  inferior-inferolateral wall - consistent with large size severe intensity fixed defect indicative the entire inferior and inferolateral walls as well as apical lateral walls. No evidence of ischemia. Read as HIGH RISK because of low EF.  [name] POLYPECTOMY    . TRANSTHORACIC ECHOCARDIOGRAM  03/2005   Normal LV function with no regional wall motion abnormality.. GR 1 DD. Mild aortic sclerosis without stenosis.  . TRANSTHORACIC ECHOCARDIOGRAM  09/09/2016   Severely reduced LV function with EF 25-30%. Diffuse hypokinesis. GR 1 DD. Diastolic flattening of the septum. Mildly dilated aortic root. Moderate RV dilation with moderately reduced function. PA pressure roughly 50 mmHg.    Current Meds  Medication Sig  . allopurinol (ZYLOPRIM) 100 MG tablet TAKE TWO TABLETS BY MOUTH ONCE DAILY (Patient taking differently: TAKE ONE TABLET (100 MG)  BY MOUTH TWICE DAILY)  . alum hydroxide-mag trisilicate (GAVISCON) [id number] MG CHEW chewable tablet Chew 2 tablets by mouth.  [name] amLODipine (NORVASC) 10 MG tablet Take 1 tablet (10 mg total) by mouth daily. Take instead of 5mg  tabs currently prescribed at same time of day.   May take 2 5mg  tabs daily until current Rx complete  . aspirin EC 81 MG tablet Take 81 mg by mouth daily.  . calcium carbonate (TUMS - DOSED IN MG ELEMENTAL CALCIUM) 500 MG chewable tablet Chew 1 tablet by mouth 3 (three) times daily as needed for indigestion or heartburn. Reported on 01/27/2016  . carboxymethylcellulose (REFRESH PLUS) 0.5 % SOLN Place 1-2 drops into both eyes 3 (three) times daily as needed (for eye dryness.).  [name] carvedilol (COREG) 25 MG tablet TAKE ONE-HALF TABLET BY MOUTH TWICE DAILY WITH A MEAL (Patient taking differently: TAKE ONE-HALF (12.5 MG) TABLET BY MOUTH TWICE DAILY WITH A MEAL)  . cetirizine (ZYRTEC) 10 MG tablet Take 10 mg by mouth daily. ALLERTEC.  . cholecalciferol (VITAMIN D) 1000 units tablet Take 1,000 Units by mouth 2 (two) times daily.  [name] donepezil (ARICEPT) 10 MG  tablet TAKE ONE TABLET BY MOUTH AT BEDTIME  . folic acid (FOLVITE) [id number] MCG tablet Take 800 mcg by mouth daily.    [name] gabapentin (NEURONTIN) 300 MG capsule Take 300-600 mg by mouth 2 (two) times daily. 300 MG IN THE MORNING AND 600 MG IN THE EVENING  . ibuprofen (ADVIL,MOTRIN) 200 MG tablet Take 400 mg by mouth 2 (two) times daily as needed for moderate pain. Reported on 01/27/2016  . ipratropium (ATROVENT) 0.06 % nasal spray Place 2 sprays into the nose 4 (four) times daily as needed for rhinitis (runny nose).  [name] levothyroxine (SYNTHROID, LEVOTHROID) 137 MCG tablet TAKE ONE TABLET BY MOUTH ONCE DAILY BEFORE BREAKFAST  . losartan (COZAAR) 100 MG tablet TAKE ONE TABLET BY MOUTH ONCE DAILY  .  Mesalamine 800 MG TBEC TAKE TWO TABLETS BY MOUTH THREE TIMES DAILY (Patient taking differently: TAKE THREE TABLETS BY MOUTH (2400 MG) IN THE MORNING & TAKE TWO TABLETS (1600 MG) BY MOUTH IN THE EVENING)  . mirabegron ER (MYRBETRIQ) 50 MG TB24 tablet Take 50 mg by mouth daily.  . Omega-3 Fatty Acids (FISH OIL) 1000 MG CAPS Take 1,000 mg by mouth every evening.   [name] OVER THE COUNTER MEDICATION Take 1 oz by mouth daily. Liquid Life 1 oz. daily   . primidone (MYSOLINE) 50 MG tablet TAKE ONE TABLET BY MOUTH THREE TIMES DAILY FOR  TREMOR (Patient taking differently: TAKE 1 TABLET (50 MG) IN THE MORNING AND 2 TABLETS (100 MG) IN THE EVENING.)  . Probiotic Product (PROBIOTIC DAILY PO) Take 1 capsule by mouth daily.    Allergies  Allergen Reactions  . Lipitor [Atorvastatin]     cramps  . Ramipril     cough  . Simvastatin     REACTION: myalgia  . Sulfonamide Derivatives     REACTION: rash    Social History   Social History  . Marital status: Married    Spouse name: N/A  . Number of children: 2  . Years of education: N/A   Occupational History  . retired   .     Social History Main Topics  . Smoking status: Former Smoker    Packs/day: 2.00    Years: 25.00    Types: Cigarettes    Quit date: 11/29/1966    . Smokeless tobacco: Never Used  . Alcohol use No  . Drug use: No  . Sexual activity: No   Other Topics Concern  . None   Social History Narrative  . None   Family History  Problem Relation Age of Onset  . Arthritis/Rheumatoid Mother   . Hypertension Father   . Emphysema Father   . Hyperlipidemia Father   . Colon cancer Cousin     mat side  . Lymphoma Sister   . Hypertension Other   . Emphysema Brother   . Hyperlipidemia Sister   . Hyperlipidemia Brother     Wt Readings from Last 3 Encounters:  10/25/16 83.6 kg (184 lb 6.4 oz)  09/21/16 81.6 kg (180 lb)  09/15/16 82.1 kg (181 lb)    PHYSICAL EXAM BP (!) 154/84   Pulse (!) 58   Ht 5' 10.5" (1.791 m)   Wt 83.6 kg (184 lb 6.4 oz)   SpO2 (!) 82%   BMI 26.08 kg/m  General appearance: alert, cooperative, appears stated age, no distress and Nourished, well groomed. Somewhat frail with unsteady gait using home oxygen.Sitting in a wheelchair HEENT: [place]/AT, EOMI, MMM, anicteric sclera Neck: no adenopathy, no carotid bruit and no JVD Lungs: Dry rhonchi and crackles. No true rales., normal percussion bilaterally and non-labored Heart: regular rate and rhythm with ectopy, S1 normal, split S2, no murmur, click, rub or gallop ; nondisplaced PMI Abdomen: soft, non-tender; bowel sounds normal; no masses,  no organomegaly; no HJR Extremities: extremities normal, atraumatic, no cyanosis, or edema  Pulses: 2+ and symmetric;  Skin: Warm dry and intact. No rashes. Neurologic: Mental status: Alert, oriented, thought content appropriate; Cranial nerves: normal (II-XII grossly intact) Psych: Normal mood and affect.    Adult ECG Report Not checked  Other studies Reviewed: Additional studies/ records that were reviewed today include:  Recent Labs:   Lab Results  Component Value Date   CREATININE 1.03 09/20/2016   Lab Results  Component Value Date  K 4.6 09/20/2016    ASSESSMENT / PLAN: Problem List Items Addressed This  Visit    Coronary artery disease involving native heart without angina pectoris (Chronic)    Still has a pretty extensive coronary disease history. Stress test was abnormal, unfortunately the only obvious lesion is not approachable from a PCI standpoint. [facility] medical management is our only option. He is on carvedilol, aspirin and amlodipine for antianginal effect. He has afterload reduction with losartan. With his lung disease we may want to consider converting from carvedilol to Bystolic.  He really does truly does not have angina symptoms, but we would have the options of adding Ranexa and/or Imdur.  Not on statin because of intolerance. At his age, I would be reluctant to push it too much.      Relevant Medications   spironolactone (ALDACTONE) 25 MG tablet   furosemide (LASIX) 20 MG tablet   Other Relevant Orders   Basic metabolic panel   Ischemic heart disease (Chronic)    Thankfully, no active anginal symptoms. Continue current medications. Adding spironolactone for reducing morbidity.      Relevant Medications   spironolactone (ALDACTONE) 25 MG tablet   furosemide (LASIX) 20 MG tablet   Chronic combined systolic and diastolic heart failure, NYHA class 3 (HCC) (Chronic)    Unfortunately he has had continued worsening EF which only serves to exacerbate his existing pulmonary disease. He really doesn't have any signs of volume overload on exam. In order to add some mild diuretic, we will start spironolactone. He had been on a diuretic in the past, we will continue to monitor he may need to be on standing Lasix at least a couple days a week but we will monitor him on spironolactone for now. He has no overactive bladder and therefore we've been reluctant to put him on diuretic.  Continuing ARB and carvedilol for now.  We will initiate sliding scale Lasix. He will need daily weights. -- If he gains more than 3 pounds in a day, he will take Lasix until his weight goes down.       Relevant Medications   spironolactone (ALDACTONE) 25 MG tablet   furosemide (LASIX) 20 MG tablet   Hyperlipidemia with target LDL less than 70; Statin intolerant (Chronic)   Relevant Medications   spironolactone (ALDACTONE) 25 MG tablet   furosemide (LASIX) 20 MG tablet   Other Relevant Orders   Basic metabolic panel   Essential hypertension (Chronic)    Not is well-controlled as we would like. Since he does have some diastolic dysfunction from his almost ischemic cardiomyopathy, we will add spironolactone to 12.5 mg for some additional diuretic effect. He is not actually on any Lasix.  He is on ARB, amlodipine and losartan      Relevant Medications   spironolactone (ALDACTONE) 25 MG tablet   furosemide (LASIX) 20 MG tablet   Other Relevant Orders   Basic metabolic panel   Chronic respiratory failure with hypoxia (HCC)    Combined chronic lung disease and CHF      Cardiomyopathy, ischemic - Primary   Relevant Medications   spironolactone (ALDACTONE) 25 MG tablet   furosemide (LASIX) 20 MG tablet   Other Relevant Orders   Basic metabolic panel   HYPERKALEMIA    He had a remote history of this. We need to be careful with his adding spironolactone. Will have his labs drawn next week at his PCPs office. We can see what reaction he has a spironolactone. Will need to  continually monitor potassium. May need to consider reinitiating low-dose loop diuretic       Other Visit Diagnoses    Encounter for medication adjustment       Relevant Orders   Basic metabolic panel      Current medicines are reviewed at length with the patient today. (+/- concerns) none The following changes have been made:   Patient Instructions  START SPIROLACTONE 12.5 MG ONCE DAILY  Sliding scale Lasix: Weigh yourself  Daily in the Morning. Your dry weight will be what your scale says on the day you return home.(here is 184 lbs.).   If you gain more than 3 pounds from dry weight: TAKE the Lasix dosing  to 40 mg in the morning a until weight returns to baseline dry weight  PLEASE HAVE LABS DONE Monday - MAY HAVE PRIMARY TO DO ,IF NOT HAVE ASSISTANT LIVING TO DRAW LABS  Your physician wants you to follow-up in: [place] HARDINGYou will receive a reminder letter in the mail two months in advance. If you don't receive a letter, please call our office to schedule the follow-up appointment.    Studies Ordered:   Orders Placed This Encounter  Procedures  . Basic metabolic panel      Glenetta Hew, M.D., M.S. Interventional Cardiologist   Pager # [phone] Phone # [phone] [address] [place], [place] [address]

## 2016-10-27 ENCOUNTER — Encounter: Payer: Self-pay | Admitting: Cardiology

## 2016-10-27 DIAGNOSIS — I5042 Chronic combined systolic (congestive) and diastolic (congestive) heart failure: Secondary | ICD-10-CM | POA: Insufficient documentation

## 2016-10-27 NOTE — Assessment & Plan Note (Signed)
Thankfully, no active anginal symptoms. Continue current medications. Adding spironolactone for reducing morbidity.

## 2016-10-27 NOTE — Assessment & Plan Note (Addendum)
Still has a pretty extensive coronary disease history. Stress test was abnormal, unfortunately the only obvious lesion is not approachable from a PCI standpoint. [facility] medical management is our only option. He is on carvedilol, aspirin and amlodipine for antianginal effect. He has afterload reduction with losartan. With his lung disease we may want to consider converting from carvedilol to Bystolic.  He really does truly does not have angina symptoms, but we would have the options of adding Ranexa and/or Imdur.  Not on statin because of intolerance. At his age, I would be reluctant to push it too much.

## 2016-10-27 NOTE — Assessment & Plan Note (Signed)
He had a remote history of this. We need to be careful with his adding spironolactone. Will have his labs drawn next week at his PCPs office. We can see what reaction he has a spironolactone. Will need to continually monitor potassium. May need to consider reinitiating low-dose loop diuretic

## 2016-10-27 NOTE — Assessment & Plan Note (Signed)
Combined chronic lung disease and CHF

## 2016-10-27 NOTE — Assessment & Plan Note (Addendum)
Not is well-controlled as we would like. Since he does have some diastolic dysfunction from his almost ischemic cardiomyopathy, we will add spironolactone to 12.5 mg for some additional diuretic effect. He is not actually on any Lasix.  He is on ARB, amlodipine and losartan

## 2016-10-27 NOTE — Assessment & Plan Note (Addendum)
Unfortunately he has had continued worsening EF which only serves to exacerbate his existing pulmonary disease. He really doesn't have any signs of volume overload on exam. In order to add some mild diuretic, we will start spironolactone. He had been on a diuretic in the past, we will continue to monitor he may need to be on standing Lasix at least a couple days a week but we will monitor him on spironolactone for now. He has no overactive bladder and therefore we've been reluctant to put him on diuretic.  Continuing ARB and carvedilol for now.  We will initiate sliding scale Lasix. He will need daily weights. -- If he gains more than 3 pounds in a day, he will take Lasix until his weight goes down.

## 2016-10-30 ENCOUNTER — Telehealth: Payer: Self-pay

## 2016-10-30 MED ORDER — ALUM HYDROXIDE-MAG TRISILICATE 80-20 MG PO CHEW: 2.0000 | CHEWABLE_TABLET | Freq: Two times a day (BID) | ORAL | 2 refills | Status: AC | PRN

## 2016-10-30 NOTE — Telephone Encounter (Signed)
Verbal clarification from PCP.   New rx with dose instructions faxed over to pharmacy of assisted living facility.

## 2016-10-30 NOTE — Telephone Encounter (Signed)
Krisitn with [facility]isted living [facility] and stated that they needed some clarification on medication.  Call back number [phone].   Called facility and the medication that they needed clarification on is the Gaviscon.   Current sig is chew 2 tabs po.   How often is pt to do this per day?   Fax: [phone]

## 2016-11-01 ENCOUNTER — Encounter: Payer: Self-pay | Admitting: Internal Medicine

## 2016-11-01 ENCOUNTER — Other Ambulatory Visit (INDEPENDENT_AMBULATORY_CARE_PROVIDER_SITE_OTHER): Payer: PPO

## 2016-11-01 ENCOUNTER — Ambulatory Visit (INDEPENDENT_AMBULATORY_CARE_PROVIDER_SITE_OTHER): Payer: PPO | Admitting: Internal Medicine

## 2016-11-01 ENCOUNTER — Ambulatory Visit: Payer: PPO | Admitting: Internal Medicine

## 2016-11-01 DIAGNOSIS — R945 Abnormal results of liver function studies: Principal | ICD-10-CM

## 2016-11-01 DIAGNOSIS — J84112 Idiopathic pulmonary fibrosis: Secondary | ICD-10-CM

## 2016-11-01 DIAGNOSIS — Z7409 Other reduced mobility: Secondary | ICD-10-CM | POA: Diagnosis not present

## 2016-11-01 DIAGNOSIS — R7989 Other specified abnormal findings of blood chemistry: Secondary | ICD-10-CM

## 2016-11-01 DIAGNOSIS — J9611 Chronic respiratory failure with hypoxia: Secondary | ICD-10-CM

## 2016-11-01 LAB — HEPATIC FUNCTION PANEL
ALBUMIN: 3.5 g/dL (ref 3.5–5.2)
ALT: 33 U/L (ref 0–53)
AST: 26 U/L (ref 0–37)
Alkaline Phosphatase: 93 U/L (ref 39–117)
BILIRUBIN TOTAL: 0.6 mg/dL (ref 0.2–1.2)
Bilirubin, Direct: 0.1 mg/dL (ref 0.0–0.3)
Total Protein: 7.4 g/dL (ref 6.0–8.3)

## 2016-11-01 LAB — BASIC METABOLIC PANEL
BUN: 19 mg/dL (ref 6–23)
CO2: 26 mEq/L (ref 19–32)
CREATININE: 1.07 mg/dL (ref 0.40–1.50)
Calcium: 9.1 mg/dL (ref 8.4–10.5)
Chloride: 108 mEq/L (ref 96–112)
GFR: 69.79 mL/min (ref >60.00)
GLUCOSE: 141 mg/dL — AB (ref 70–99)
POTASSIUM: 3.7 meq/L (ref 3.5–5.1)
Sodium: 141 mEq/L (ref 135–145)

## 2016-11-01 LAB — TSH: TSH: 6 u[IU]/mL — AB (ref 0.35–4.50)

## 2016-11-01 MED ORDER — LEVOTHYROXINE SODIUM 150 MCG PO TABS: 150.0000 ug | ORAL_TABLET | Freq: Every day | ORAL | 3 refills | Status: DC

## 2016-11-01 NOTE — Assessment & Plan Note (Signed)
03/10/16 - pt needs a scooter for mobility

## 2016-11-01 NOTE — Progress Notes (Signed)
Subjective:  Patient ID: Chris Stokes, male    DOB: 1929-11-07  Age: 82 y.o. MRN: [id number]  CC: No chief complaint on file.   HPI Chris Stokes presents to document a need for a power scooter need Unable to ambulate 20 ft due to PF (SOB)  Past Medical History:  Diagnosis Date  . Cataract 1999   bilateral cataracts   . Chronic kidney disease    multiple kidney stone flare ups  . Colon polyps    adenomatous  . Coronary artery disease, occlusive    Dr. Mare Ferrari; Cath 02/2003 for Inf STEMI -> dLM CAD with moderate to severe LAD, RI & Cx stenoses, subtotal RCA.--> CABG x 4: LIMA-LAD, SVG-RI, SVG-rPDA-rPL  . GERD (gastroesophageal reflux disease) 2012  . Hiatal hernia   . Hyperlipidemia   . Hypertension   . Hypothyroidism   . Pulmonary fibrosis ([place]) 12/08/2012   on Home O2 (asbestos exposure); Dr. Halford Chessman  . Rectal fissure   . Seasonal allergies   . ST elevation myocardial infarction (STEMI) of inferior wall ([facility]) 1984   Inferior MI  . Subsequent ST elevation (STEMI) myocardial infarction of inferior wall 02/2003   Subtotal RCA occlusion -- MV CAD on Cath --> CABG x 4  . Ulcerative colitis    Dr Henrene Pastor   Past Surgical History:  Procedure Laterality Date  . ABDOMINAL AORTIC ANEURYSM REPAIR  1992  . CARDIAC CATHETERIZATION  02/2003   Inferior STEMI: dLM 60-70%; LAD heavy Ca2+ - mLAD 70% after D2, small pD1 60-70%, Ostial sm-mod D2 60-70%, Mod pRI 60%, Mod pCx 60%; Large/Dominant RCA - ectatic & severely diseased throughout with ectasia/stenoses & thrombus sub-totalled pre-PDA &rPL. --> Referred for CABG  . CARDIAC CATHETERIZATION N/A 09/21/2016   Procedure: Right/Left Heart Cath and Coronary/Graft Angiography;  Surgeon: Leonie Man, MD;  Location: [facility];  Service: Cardiovascular;  Laterality: N/A;  . COLONOSCOPY    . CORONARY ARTERY BYPASS GRAFT  2004   4 vessel bypass: LIMA-LAD, SVG-RI, SVG-rPDA-rPL; unable to bypass the small Cx-OM vessels  . NM MYOVIEW LTD   09/2012   There is evidence of a small/moderate scar affecting the inferior wall in the inferolateral wall. There may be slight peri-infarct ischemia. LV Ejection Fraction: 53%.  There is mild hypokinesis of the septum and the apex.  [name] NM MYOVIEW LTD  08/31/2016   Severely reduced LVEF of 28%. Akinesis of the apical lateral and entire inferior-inferolateral wall - consistent with large size severe intensity fixed defect indicative the entire inferior and inferolateral walls as well as apical lateral walls. No evidence of ischemia. Read as HIGH RISK because of low EF.  [name] POLYPECTOMY    . TRANSTHORACIC ECHOCARDIOGRAM  03/2005   Normal LV function with no regional wall motion abnormality.. GR 1 DD. Mild aortic sclerosis without stenosis.  . TRANSTHORACIC ECHOCARDIOGRAM  09/09/2016   Severely reduced LV function with EF 25-30%. Diffuse hypokinesis. GR 1 DD. Diastolic flattening of the septum. Mildly dilated aortic root. Moderate RV dilation with moderately reduced function. PA pressure roughly 50 mmHg.    reports that he quit smoking about 49 years ago. His smoking use included Cigarettes. He has a 50.00 pack-year smoking history. He has never used smokeless tobacco. He reports that he does not drink alcohol or use drugs. family history includes Arthritis/Rheumatoid in his mother; Colon cancer in his cousin; Emphysema in his brother and father; Hyperlipidemia in his brother, father, and sister; Hypertension in his father and other; Lymphoma  in his sister. Allergies  Allergen Reactions  . Lipitor [Atorvastatin]     cramps  . Ramipril     cough  . Simvastatin     REACTION: myalgia  . Sulfonamide Derivatives     REACTION: rash     Outpatient Medications Prior to Visit  Medication Sig Dispense Refill  . allopurinol (ZYLOPRIM) 100 MG tablet TAKE TWO TABLETS BY MOUTH ONCE DAILY (Patient taking differently: TAKE ONE TABLET (100 MG)  BY MOUTH TWICE DAILY) 180 tablet 3  . alum hydroxide-mag  trisilicate (GAVISCON) [id number] MG CHEW chewable tablet Chew 2 tablets by mouth 2 (two) times daily as needed for indigestion or heartburn. 120 tablet 2  . amLODipine (NORVASC) 10 MG tablet Take 1 tablet (10 mg total) by mouth daily. Take instead of 5mg  tabs currently prescribed at same time of day.   May take 2 5mg  tabs daily until current Rx complete 30 tablet 3  . aspirin EC 81 MG tablet Take 81 mg by mouth daily.    . calcium carbonate (TUMS - DOSED IN MG ELEMENTAL CALCIUM) 500 MG chewable tablet Chew 1 tablet by mouth 3 (three) times daily as needed for indigestion or heartburn. Reported on 01/27/2016    . carboxymethylcellulose (REFRESH PLUS) 0.5 % SOLN Place 1-2 drops into both eyes 3 (three) times daily as needed (for eye dryness.).    [name] carvedilol (COREG) 25 MG tablet TAKE ONE-HALF TABLET BY MOUTH TWICE DAILY WITH A MEAL (Patient taking differently: TAKE ONE-HALF (12.5 MG) TABLET BY MOUTH TWICE DAILY WITH A MEAL) 90 tablet 3  . cetirizine (ZYRTEC) 10 MG tablet Take 10 mg by mouth daily. ALLERTEC.    . cholecalciferol (VITAMIN D) 1000 units tablet Take 1,000 Units by mouth 2 (two) times daily.    [name] donepezil (ARICEPT) 10 MG tablet TAKE ONE TABLET BY MOUTH AT BEDTIME 90 tablet 3  . folic acid (FOLVITE) [id number] MCG tablet Take 800 mcg by mouth daily.      . furosemide (LASIX) 20 MG tablet Take 1 tablet (20 mg total) by mouth daily as needed. 90 tablet 3  . gabapentin (NEURONTIN) 300 MG capsule Take 300-600 mg by mouth 2 (two) times daily. 300 MG IN THE MORNING AND 600 MG IN THE EVENING    . ibuprofen (ADVIL,MOTRIN) 200 MG tablet Take 400 mg by mouth 2 (two) times daily as needed for moderate pain. Reported on 01/27/2016    . ipratropium (ATROVENT) 0.06 % nasal spray Place 2 sprays into the nose 4 (four) times daily as needed for rhinitis (runny nose). 45 mL 2  . levothyroxine (SYNTHROID, LEVOTHROID) 137 MCG tablet TAKE ONE TABLET BY MOUTH ONCE DAILY BEFORE BREAKFAST 90 tablet 3  . losartan (COZAAR) 100  MG tablet TAKE ONE TABLET BY MOUTH ONCE DAILY 90 tablet 3  . Mesalamine 800 MG TBEC TAKE TWO TABLETS BY MOUTH THREE TIMES DAILY (Patient taking differently: TAKE THREE TABLETS BY MOUTH (2400 MG) IN THE MORNING & TAKE TWO TABLETS (1600 MG) BY MOUTH IN THE EVENING) 180 tablet 0  . mirabegron ER (MYRBETRIQ) 50 MG TB24 tablet Take 50 mg by mouth daily.    . Omega-3 Fatty Acids (FISH OIL) 1000 MG CAPS Take 1,000 mg by mouth every evening.     [name] OVER THE COUNTER MEDICATION Take 1 oz by mouth daily. Liquid Life 1 oz. daily     . primidone (MYSOLINE) 50 MG tablet TAKE ONE TABLET BY MOUTH THREE TIMES DAILY FOR  TREMOR (Patient taking differently:  TAKE 1 TABLET (50 MG) IN THE MORNING AND 2 TABLETS (100 MG) IN THE EVENING.) 270 tablet 3  . Probiotic Product (PROBIOTIC DAILY PO) Take 1 capsule by mouth daily.    [name] spironolactone (ALDACTONE) 25 MG tablet Take 0.5 tablets (12.5 mg total) by mouth daily. 45 tablet 3   No facility-administered medications prior to visit.     ROS Review of Systems  Constitutional: Positive for fatigue. Negative for appetite change and unexpected weight change.  HENT: Negative for congestion, nosebleeds, sneezing, sore throat and trouble swallowing.   Eyes: Negative for itching and visual disturbance.  Respiratory: Positive for shortness of breath and wheezing. Negative for cough.   Cardiovascular: Negative for chest pain, palpitations and leg swelling.  Gastrointestinal: Negative for abdominal distention, blood in stool, diarrhea and nausea.  Genitourinary: Negative for frequency and hematuria.  Musculoskeletal: Positive for gait problem. Negative for back pain, joint swelling and neck pain.  Skin: Negative for rash.  Neurological: Positive for weakness. Negative for dizziness, tremors and speech difficulty.  Psychiatric/Behavioral: Negative for agitation, dysphoric mood and sleep disturbance. The patient is not nervous/anxious.     Objective:  BP 134/88   Pulse 68   Wt  184 lb (83.5 kg)   SpO2 (!) 82% Comment: On 2 L O2  BMI 26.03 kg/m   BP Readings from Last 3 Encounters:  11/01/16 134/88  10/25/16 (!) 154/84  09/21/16 (!) 147/93    Wt Readings from Last 3 Encounters:  11/01/16 184 lb (83.5 kg)  10/25/16 184 lb 6.4 oz (83.6 kg)  09/21/16 180 lb (81.6 kg)    Physical Exam  Constitutional: He is oriented to person, place, and time. He appears well-developed. No distress.  NAD  HENT:  Mouth/Throat: Oropharynx is clear and moist.  Eyes: Conjunctivae are normal. Pupils are equal, round, and reactive to light.  Neck: Normal range of motion. No JVD present. No thyromegaly present.  Cardiovascular: Normal rate, regular rhythm, normal heart sounds and intact distal pulses.  Exam reveals no gallop and no friction rub.   No murmur heard. Pulmonary/Chest: Breath sounds normal. He is in respiratory distress. He has no wheezes. He has no rales. He exhibits no tenderness.  Abdominal: Soft. Bowel sounds are normal. He exhibits no distension and no mass. There is no tenderness. There is no rebound and no guarding.  Musculoskeletal: Normal range of motion. He exhibits no edema or tenderness.  Lymphadenopathy:    He has no cervical adenopathy.  Neurological: He is alert and oriented to person, place, and time. He has normal reflexes. No cranial nerve deficit. He exhibits normal muscle tone. He displays a negative Romberg sign. Coordination and gait normal.  Skin: Skin is warm and dry. No rash noted.  Psychiatric: He has a normal mood and affect. His behavior is normal. Judgment and thought content normal.  in a w/c SOB   03/10/16 - The visit was made to assess mobility. The pt has a markedly decreased mobility due to his IPF and SOB and he would benefit from using a power scooter.  Lab Results  Component Value Date   WBC 7.0 09/20/2016   HGB 14.9 09/20/2016   HCT 44.8 09/20/2016   PLT 106 (L) 09/20/2016   GLUCOSE 94 09/20/2016   CHOL 211 (H) 07/30/2016     TRIG 85 07/30/2016   HDL 38 (L) 07/30/2016   LDLCALC 156 (H) 07/30/2016   ALT 83 (H) 07/30/2016   AST 47 (H) 07/30/2016   NA 140  09/20/2016   K 4.6 09/20/2016   CL 105 09/20/2016   CREATININE 1.03 09/20/2016   BUN 17 09/20/2016   CO2 26 09/20/2016   TSH 18.71 (H) 05/03/2016   PSA 1.91 11/09/2010   INR 1.1 09/20/2016    No results found.  Assessment & Plan:   There are no diagnoses linked to this encounter. I am having Mr. Pellerito maintain his Fish Oil, folic acid, ipratropium, OVER THE COUNTER MEDICATION, ibuprofen, calcium carbonate, gabapentin, levothyroxine, donepezil, losartan, allopurinol, carvedilol, primidone, Mesalamine, aspirin EC, cetirizine, cholecalciferol, Probiotic Product (PROBIOTIC DAILY PO), carboxymethylcellulose, amLODipine, mirabegron ER, spironolactone, furosemide, and alum hydroxide-mag trisilicate.  No orders of the defined types were placed in this encounter.    Follow-up: No Follow-up on file.  [name], MD

## 2016-11-01 NOTE — Assessment & Plan Note (Addendum)
Severe 03/10/16 - pt has a markedly decreased mobility due to his IPF and SOB and he would benefit from using a power scooter

## 2016-11-01 NOTE — Progress Notes (Signed)
Pre visit review using our clinic review tool, if applicable. No additional management support is needed unless otherwise documented below in the visit note. 

## 2016-11-11 DIAGNOSIS — J849 Interstitial pulmonary disease, unspecified: Secondary | ICD-10-CM | POA: Diagnosis not present

## 2016-11-11 DIAGNOSIS — J9611 Chronic respiratory failure with hypoxia: Secondary | ICD-10-CM | POA: Diagnosis not present

## 2016-11-11 DIAGNOSIS — J84112 Idiopathic pulmonary fibrosis: Secondary | ICD-10-CM | POA: Diagnosis not present

## 2016-11-18 ENCOUNTER — Telehealth: Payer: Self-pay | Admitting: Emergency Medicine

## 2016-11-18 DIAGNOSIS — Z7409 Other reduced mobility: Secondary | ICD-10-CM

## 2016-11-18 DIAGNOSIS — R262 Difficulty in walking, not elsewhere classified: Secondary | ICD-10-CM | POA: Diagnosis not present

## 2016-11-18 NOTE — Telephone Encounter (Signed)
Pts daughter called and stated her dad was in to see Dr Camila Li a few weeks ago. He was going to send an order over to [place] for a motorized scooter. [facility] has not received the order. Can it be refaxed to [phone]. Please advise thanks.

## 2016-11-18 NOTE — Telephone Encounter (Signed)
Thx

## 2016-11-18 NOTE — Telephone Encounter (Signed)
11/01/16 OV note and Rx for electric power scooter faxed to [place] at number below. [name], pt's daughter informed.

## 2016-11-19 DIAGNOSIS — R262 Difficulty in walking, not elsewhere classified: Secondary | ICD-10-CM | POA: Diagnosis not present

## 2016-11-19 NOTE — Telephone Encounter (Signed)
[name] called to advised that [facility] states they didn't et a fax, but it wouldn't matter bc it wouldn't be covered. She states [facility] told her that they will only cover a motorized wheelchair.

## 2016-11-19 NOTE — Telephone Encounter (Signed)
Ok a motorized w/c Thx

## 2016-11-23 ENCOUNTER — Encounter (HOSPITAL_COMMUNITY): Payer: Self-pay | Admitting: Emergency Medicine

## 2016-11-23 ENCOUNTER — Inpatient Hospital Stay (HOSPITAL_COMMUNITY)
Admission: EM | Admit: 2016-11-23 | Discharge: 2016-12-01 | DRG: 189 | Disposition: A | Discharge status: Hospice | Payer: PPO | Attending: Internal Medicine | Admitting: Internal Medicine

## 2016-11-23 ENCOUNTER — Emergency Department (HOSPITAL_COMMUNITY): Payer: PPO

## 2016-11-23 DIAGNOSIS — Z800 Family history of malignant neoplasm of digestive organs: Secondary | ICD-10-CM

## 2016-11-23 DIAGNOSIS — N170 Acute kidney failure with tubular necrosis: Secondary | ICD-10-CM | POA: Diagnosis present

## 2016-11-23 DIAGNOSIS — I10 Essential (primary) hypertension: Secondary | ICD-10-CM | POA: Diagnosis present

## 2016-11-23 DIAGNOSIS — Z807 Family history of other malignant neoplasms of lymphoid, hematopoietic and related tissues: Secondary | ICD-10-CM

## 2016-11-23 DIAGNOSIS — D696 Thrombocytopenia, unspecified: Secondary | ICD-10-CM | POA: Diagnosis not present

## 2016-11-23 DIAGNOSIS — I2510 Atherosclerotic heart disease of native coronary artery without angina pectoris: Secondary | ICD-10-CM | POA: Diagnosis present

## 2016-11-23 DIAGNOSIS — I252 Old myocardial infarction: Secondary | ICD-10-CM

## 2016-11-23 DIAGNOSIS — Z825 Family history of asthma and other chronic lower respiratory diseases: Secondary | ICD-10-CM

## 2016-11-23 DIAGNOSIS — N183 Chronic kidney disease, stage 3 (moderate): Secondary | ICD-10-CM | POA: Diagnosis present

## 2016-11-23 DIAGNOSIS — Z7189 Other specified counseling: Secondary | ICD-10-CM

## 2016-11-23 DIAGNOSIS — E0590 Thyrotoxicosis, unspecified without thyrotoxic crisis or storm: Secondary | ICD-10-CM | POA: Diagnosis not present

## 2016-11-23 DIAGNOSIS — I712 Thoracic aortic aneurysm, without rupture: Secondary | ICD-10-CM | POA: Diagnosis present

## 2016-11-23 DIAGNOSIS — Z87891 Personal history of nicotine dependence: Secondary | ICD-10-CM

## 2016-11-23 DIAGNOSIS — R0602 Shortness of breath: Secondary | ICD-10-CM

## 2016-11-23 DIAGNOSIS — Z7982 Long term (current) use of aspirin: Secondary | ICD-10-CM

## 2016-11-23 DIAGNOSIS — I509 Heart failure, unspecified: Secondary | ICD-10-CM | POA: Diagnosis not present

## 2016-11-23 DIAGNOSIS — J432 Centrilobular emphysema: Secondary | ICD-10-CM | POA: Diagnosis not present

## 2016-11-23 DIAGNOSIS — Z8249 Family history of ischemic heart disease and other diseases of the circulatory system: Secondary | ICD-10-CM

## 2016-11-23 DIAGNOSIS — Z79899 Other long term (current) drug therapy: Secondary | ICD-10-CM

## 2016-11-23 DIAGNOSIS — I130 Hypertensive heart and chronic kidney disease with heart failure and stage 1 through stage 4 chronic kidney disease, or unspecified chronic kidney disease: Secondary | ICD-10-CM | POA: Diagnosis present

## 2016-11-23 DIAGNOSIS — I255 Ischemic cardiomyopathy: Secondary | ICD-10-CM | POA: Diagnosis not present

## 2016-11-23 DIAGNOSIS — Z888 Allergy status to other drugs, medicaments and biological substances status: Secondary | ICD-10-CM

## 2016-11-23 DIAGNOSIS — N179 Acute kidney failure, unspecified: Secondary | ICD-10-CM | POA: Diagnosis not present

## 2016-11-23 DIAGNOSIS — J8410 Pulmonary fibrosis, unspecified: Secondary | ICD-10-CM | POA: Diagnosis not present

## 2016-11-23 DIAGNOSIS — I272 Pulmonary hypertension, unspecified: Secondary | ICD-10-CM | POA: Diagnosis not present

## 2016-11-23 DIAGNOSIS — Z882 Allergy status to sulfonamides status: Secondary | ICD-10-CM

## 2016-11-23 DIAGNOSIS — L89151 Pressure ulcer of sacral region, stage 1: Secondary | ICD-10-CM | POA: Diagnosis present

## 2016-11-23 DIAGNOSIS — Z66 Do not resuscitate: Secondary | ICD-10-CM | POA: Diagnosis present

## 2016-11-23 DIAGNOSIS — I5043 Acute on chronic combined systolic (congestive) and diastolic (congestive) heart failure: Secondary | ICD-10-CM | POA: Diagnosis not present

## 2016-11-23 DIAGNOSIS — I440 Atrioventricular block, first degree: Secondary | ICD-10-CM | POA: Diagnosis present

## 2016-11-23 DIAGNOSIS — R0902 Hypoxemia: Secondary | ICD-10-CM | POA: Diagnosis not present

## 2016-11-23 DIAGNOSIS — J9621 Acute and chronic respiratory failure with hypoxia: Secondary | ICD-10-CM | POA: Diagnosis not present

## 2016-11-23 DIAGNOSIS — J84112 Idiopathic pulmonary fibrosis: Secondary | ICD-10-CM

## 2016-11-23 DIAGNOSIS — I5022 Chronic systolic (congestive) heart failure: Secondary | ICD-10-CM | POA: Diagnosis not present

## 2016-11-23 DIAGNOSIS — E785 Hyperlipidemia, unspecified: Secondary | ICD-10-CM | POA: Diagnosis not present

## 2016-11-23 DIAGNOSIS — Z8673 Personal history of transient ischemic attack (TIA), and cerebral infarction without residual deficits: Secondary | ICD-10-CM

## 2016-11-23 DIAGNOSIS — R531 Weakness: Secondary | ICD-10-CM | POA: Diagnosis not present

## 2016-11-23 DIAGNOSIS — J189 Pneumonia, unspecified organism: Secondary | ICD-10-CM | POA: Diagnosis present

## 2016-11-23 DIAGNOSIS — I5023 Acute on chronic systolic (congestive) heart failure: Secondary | ICD-10-CM | POA: Diagnosis not present

## 2016-11-23 DIAGNOSIS — M79609 Pain in unspecified limb: Secondary | ICD-10-CM | POA: Diagnosis not present

## 2016-11-23 DIAGNOSIS — E039 Hypothyroidism, unspecified: Secondary | ICD-10-CM | POA: Diagnosis present

## 2016-11-23 DIAGNOSIS — L8990 Pressure ulcer of unspecified site, unspecified stage: Secondary | ICD-10-CM | POA: Insufficient documentation

## 2016-11-23 DIAGNOSIS — Z515 Encounter for palliative care: Secondary | ICD-10-CM | POA: Diagnosis not present

## 2016-11-23 DIAGNOSIS — E034 Atrophy of thyroid (acquired): Secondary | ICD-10-CM | POA: Diagnosis not present

## 2016-11-23 DIAGNOSIS — J9610 Chronic respiratory failure, unspecified whether with hypoxia or hypercapnia: Secondary | ICD-10-CM | POA: Diagnosis not present

## 2016-11-23 DIAGNOSIS — R05 Cough: Secondary | ICD-10-CM | POA: Diagnosis not present

## 2016-11-23 DIAGNOSIS — J9600 Acute respiratory failure, unspecified whether with hypoxia or hypercapnia: Secondary | ICD-10-CM | POA: Diagnosis not present

## 2016-11-23 DIAGNOSIS — M6281 Muscle weakness (generalized): Secondary | ICD-10-CM

## 2016-11-23 DIAGNOSIS — Z9981 Dependence on supplemental oxygen: Secondary | ICD-10-CM

## 2016-11-23 DIAGNOSIS — R069 Unspecified abnormalities of breathing: Secondary | ICD-10-CM | POA: Diagnosis not present

## 2016-11-23 LAB — URINALYSIS, ROUTINE W REFLEX MICROSCOPIC
BILIRUBIN URINE: NEGATIVE
Bacteria, UA: NONE SEEN
Glucose, UA: NEGATIVE mg/dL
Hgb urine dipstick: NEGATIVE
KETONES UR: NEGATIVE mg/dL
Leukocytes, UA: NEGATIVE
Nitrite: NEGATIVE
PROTEIN: 30 mg/dL — AB
Specific Gravity, Urine: 1.016 (ref 1.005–1.030)
pH: 5 (ref 5.0–8.0)

## 2016-11-23 LAB — BASIC METABOLIC PANEL
Anion gap: 8 (ref 5–15)
BUN: 18 mg/dL (ref 6–20)
CALCIUM: 9.4 mg/dL (ref 8.9–10.3)
CHLORIDE: 107 mmol/L (ref 101–111)
CO2: 26 mmol/L (ref 22–32)
CREATININE: 1.4 mg/dL — AB (ref 0.61–1.24)
GFR calc Af Amer: 51 mL/min — ABNORMAL LOW (ref >60)
GFR calc non Af Amer: 44 mL/min — ABNORMAL LOW (ref >60)
Glucose, Bld: 124 mg/dL — ABNORMAL HIGH (ref 65–99)
Potassium: 4.4 mmol/L (ref 3.5–5.1)
SODIUM: 141 mmol/L (ref 135–145)

## 2016-11-23 LAB — CBC WITH DIFFERENTIAL/PLATELET
Basophils Absolute: 0 10*3/uL (ref 0.0–0.1)
Basophils Relative: 0 %
EOS PCT: 3 %
Eosinophils Absolute: 0.2 10*3/uL (ref 0.0–0.7)
HCT: 46.4 % (ref 39.0–52.0)
HEMOGLOBIN: 15 g/dL (ref 13.0–17.0)
LYMPHS ABS: 0.8 10*3/uL (ref 0.7–4.0)
LYMPHS PCT: 13 %
MCH: 33.4 pg (ref 26.0–34.0)
MCHC: 32.3 g/dL (ref 30.0–36.0)
MCV: 103.3 fL — AB (ref 78.0–100.0)
MONOS PCT: 9 %
Monocytes Absolute: 0.6 10*3/uL (ref 0.1–1.0)
NEUTROS PCT: 75 %
Neutro Abs: 4.5 10*3/uL (ref 1.7–7.7)
Platelets: 77 10*3/uL — ABNORMAL LOW (ref 150–400)
RBC: 4.49 MIL/uL (ref 4.22–5.81)
RDW: 14.8 % (ref 11.5–15.5)
WBC: 6 10*3/uL (ref 4.0–10.5)

## 2016-11-23 LAB — CREATININE, SERUM
Creatinine, Ser: 1.2 mg/dL (ref 0.61–1.24)
GFR calc non Af Amer: 53 mL/min — ABNORMAL LOW (ref >60)

## 2016-11-23 LAB — LACTIC ACID, PLASMA
Lactic Acid, Venous: 2.6 mmol/L (ref 0.5–1.9)
Lactic Acid, Venous: 1.6 mmol/L (ref 0.5–1.9)

## 2016-11-23 LAB — CBC
HEMATOCRIT: 50.4 % (ref 39.0–52.0)
Hemoglobin: 16.5 g/dL (ref 13.0–17.0)
MCH: 34.3 pg — AB (ref 26.0–34.0)
MCHC: 32.7 g/dL (ref 30.0–36.0)
MCV: 104.8 fL — AB (ref 78.0–100.0)
Platelets: 81 10*3/uL — ABNORMAL LOW (ref 150–400)
RBC: 4.81 MIL/uL (ref 4.22–5.81)
RDW: 14.7 % (ref 11.5–15.5)
WBC: 6.8 10*3/uL (ref 4.0–10.5)

## 2016-11-23 LAB — PHOSPHORUS: Phosphorus: 2.5 mg/dL (ref 2.5–4.6)

## 2016-11-23 LAB — CREATININE, URINE, RANDOM

## 2016-11-23 LAB — MAGNESIUM: Magnesium: 1.8 mg/dL (ref 1.7–2.4)

## 2016-11-23 LAB — TROPONIN I
TROPONIN I: 0.03 ng/mL — AB (ref <0.03)
TROPONIN I: 0.03 ng/mL — AB (ref <0.03)
Troponin I: 0.03 ng/mL (ref <0.03)

## 2016-11-23 LAB — MRSA PCR SCREENING: MRSA BY PCR: NEGATIVE

## 2016-11-23 LAB — STREP PNEUMONIAE URINARY ANTIGEN: Strep Pneumo Urinary Antigen: NEGATIVE

## 2016-11-23 LAB — BRAIN NATRIURETIC PEPTIDE: B NATRIURETIC PEPTIDE 5: 1680.4 pg/mL — AB (ref 0.0–100.0)

## 2016-11-23 MED ORDER — FUROSEMIDE 10 MG/ML IJ SOLN
20.0000 mg | Freq: Once | INTRAMUSCULAR | Status: DC
  Filled 2016-11-23: qty 2

## 2016-11-23 MED ORDER — MIRABEGRON ER 50 MG PO TB24
50.0000 mg | ORAL_TABLET | Freq: Every day | ORAL | Status: DC
  Administered 2016-11-24 – 2016-12-01 (×8): 50 mg via ORAL
  Filled 2016-11-23 (×8): qty 1

## 2016-11-23 MED ORDER — FOLIC ACID 1 MG PO TABS
1.0000 mg | ORAL_TABLET | Freq: Every day | ORAL | Status: DC
  Administered 2016-11-24 – 2016-12-01 (×8): 1 mg via ORAL
  Filled 2016-11-23 (×8): qty 1

## 2016-11-23 MED ORDER — DEXTROSE 5 % IV SOLN
500.0000 mg | INTRAVENOUS | Status: DC
  Filled 2016-11-23: qty 500

## 2016-11-23 MED ORDER — ENOXAPARIN SODIUM 150 MG/ML ~~LOC~~ SOLN
1.5000 mg/kg | SUBCUTANEOUS | Status: DC
  Administered 2016-11-23: 125 mg via SUBCUTANEOUS
  Filled 2016-11-23: qty 0.83

## 2016-11-23 MED ORDER — IPRATROPIUM-ALBUTEROL 0.5-2.5 (3) MG/3ML IN SOLN
3.0000 mL | Freq: Four times a day (QID) | RESPIRATORY_TRACT | Status: DC
  Administered 2016-11-23 (×2): 3 mL via RESPIRATORY_TRACT
  Filled 2016-11-23 (×2): qty 3

## 2016-11-23 MED ORDER — LEVOTHYROXINE SODIUM 75 MCG PO TABS
150.0000 ug | ORAL_TABLET | Freq: Every day | ORAL | Status: DC
  Administered 2016-11-24 – 2016-12-01 (×8): 150 ug via ORAL
  Filled 2016-11-23 (×8): qty 2

## 2016-11-23 MED ORDER — FUROSEMIDE 10 MG/ML IJ SOLN
INTRAMUSCULAR | Status: AC
  Filled 2016-11-23: qty 4

## 2016-11-23 MED ORDER — METHYLPREDNISOLONE SODIUM SUCC 125 MG IJ SOLR
125.0000 mg | Freq: Once | INTRAMUSCULAR | Status: AC
  Administered 2016-11-23: 125 mg via INTRAVENOUS
  Filled 2016-11-23: qty 2

## 2016-11-23 MED ORDER — OMEGA-3-ACID ETHYL ESTERS 1 G PO CAPS
1.0000 g | ORAL_CAPSULE | Freq: Every day | ORAL | Status: DC
  Administered 2016-11-24 – 2016-12-01 (×8): 1 g via ORAL
  Filled 2016-11-23 (×8): qty 1

## 2016-11-23 MED ORDER — FUROSEMIDE 10 MG/ML IJ SOLN
40.0000 mg | Freq: Once | INTRAMUSCULAR | Status: AC
  Administered 2016-11-23: 40 mg via INTRAVENOUS

## 2016-11-23 MED ORDER — IPRATROPIUM-ALBUTEROL 0.5-2.5 (3) MG/3ML IN SOLN
3.0000 mL | Freq: Once | RESPIRATORY_TRACT | Status: AC
  Administered 2016-11-23: 3 mL via RESPIRATORY_TRACT
  Filled 2016-11-23: qty 3

## 2016-11-23 MED ORDER — POLYVINYL ALCOHOL 1.4 % OP SOLN: 1.0000 [drp] | Freq: Three times a day (TID) | OPHTHALMIC | Status: DC | PRN

## 2016-11-23 MED ORDER — AMLODIPINE BESYLATE 10 MG PO TABS
10.0000 mg | ORAL_TABLET | Freq: Every day | ORAL | Status: DC
  Administered 2016-11-24 – 2016-12-01 (×8): 10 mg via ORAL
  Filled 2016-11-23 (×4): qty 2
  Filled 2016-11-23: qty 1
  Filled 2016-11-23: qty 2
  Filled 2016-11-23 (×2): qty 1
  Filled 2016-11-23: qty 2
  Filled 2016-11-23 (×2): qty 1

## 2016-11-23 MED ORDER — RISAQUAD PO CAPS
ORAL_CAPSULE | Freq: Every day | ORAL | Status: DC
  Administered 2016-11-24 – 2016-12-01 (×8): 1 via ORAL
  Filled 2016-11-23 (×8): qty 1

## 2016-11-23 MED ORDER — DEXTROSE 5 % IV SOLN
1.0000 g | INTRAVENOUS | Status: DC
  Filled 2016-11-23: qty 10

## 2016-11-23 MED ORDER — ALBUTEROL SULFATE (2.5 MG/3ML) 0.083% IN NEBU: 2.5000 mg | INHALATION_SOLUTION | RESPIRATORY_TRACT | Status: DC | PRN

## 2016-11-23 MED ORDER — ALBUTEROL SULFATE (2.5 MG/3ML) 0.083% IN NEBU
2.5000 mg | INHALATION_SOLUTION | Freq: Once | RESPIRATORY_TRACT | Status: AC
  Administered 2016-11-23: 2.5 mg via RESPIRATORY_TRACT
  Filled 2016-11-23: qty 3

## 2016-11-23 MED ORDER — ORAL CARE MOUTH RINSE
15.0000 mL | Freq: Two times a day (BID) | OROMUCOSAL | Status: DC
  Administered 2016-11-23 – 2016-11-30 (×12): 15 mL via OROMUCOSAL

## 2016-11-23 MED ORDER — ENOXAPARIN SODIUM 30 MG/0.3ML ~~LOC~~ SOLN: 30.0000 mg | Freq: Two times a day (BID) | SUBCUTANEOUS | Status: DC

## 2016-11-23 NOTE — Telephone Encounter (Signed)
Printed DME for motorized w/c & faxed to [facility] # below...Chris Stokes

## 2016-11-23 NOTE — Progress Notes (Signed)
Bladder scan shows post-void residual: 104 ml

## 2016-11-23 NOTE — ED Provider Notes (Signed)
[facility] DEPT Provider Note   CSN: [id number] Arrival date & time: 11/23/16  1110     History   Chief Complaint Chief Complaint  Patient presents with  . Pneumonia  . Shortness of Breath    HPI Chris Stokes is a 82 y.o. male.  The history is provided by the patient, the EMS personnel and the nursing home. The history is limited by the condition of the patient (Hx dementia).  Pneumonia  Associated symptoms include shortness of breath.  Shortness of Breath   Pt was seen at 1130. Per EMS, NH report and pt: Pt c/o coughing and SOB for the past week. Pt was dx pneumonia 5 days ago and started on antibiotics. Pt has hx of pulmonary fibrosis, baseline O2 Sats 85% on O2 2L N/C. Pt told NH staff he was more SOB last night and today. On arrival to ED, pt was 78% on O2 2L N/C. Pt denies this currently. Pt has significant hx of dementia.    Past Medical History:  Diagnosis Date  . Cataract 1999   bilateral cataracts   . Chronic kidney disease    multiple kidney stone flare ups  . Colon polyps    adenomatous  . Coronary artery disease, occlusive    Dr. Mare Ferrari; Cath 02/2003 for Inf STEMI -> dLM CAD with moderate to severe LAD, RI & Cx stenoses, subtotal RCA.--> CABG x 4: LIMA-LAD, SVG-RI, SVG-rPDA-rPL  . GERD (gastroesophageal reflux disease) 2012  . Hiatal hernia   . Hyperlipidemia   . Hypertension   . Hypothyroidism   . Pulmonary fibrosis ([place]) 12/08/2012   on Home O2 (asbestos exposure); Dr. Halford Chessman  . Rectal fissure   . Seasonal allergies   . ST elevation myocardial infarction (STEMI) of inferior wall (HCC) 1984   Inferior MI  . Subsequent ST elevation (STEMI) myocardial infarction of inferior wall 02/2003   Subtotal RCA occlusion -- MV CAD on Cath --> CABG x 4  . Ulcerative colitis    Dr Henrene Pastor    Patient Active Problem List   Diagnosis Date Noted  . Impaired mobility 11/01/2016  . Chronic combined systolic and diastolic heart failure, NYHA class 3 ([place]) 10/27/2016    . DOE (dyspnea on exertion) 09/15/2016  . Cardiomyopathy, ischemic 09/15/2016  . Advance directive indicates patient wish for do-not-resuscitate status 09/15/2016  . Abnormal nuclear stress test 09/01/2016  . Elevated LFTs 08/05/2016  . Coronary artery disease, occlusive   . Diarrhea 03/15/2016  . Well adult exam 01/27/2016  . Gait disorder 10/28/2015  . Lumbar radiculopathy 07/30/2015  . Chronic respiratory failure with hypoxia ([place]) 06/04/2015  . Greater trochanteric bursitis of right hip 08/20/2014  . Low back pain 07/30/2014  . Acute right hip pain 07/23/2014  . Urinary incontinence 07/23/2014  . Neuropathy ([place]) 11/27/2013  . Dementia 02/06/2013  . Action tremor 01/04/2013  . IPF (idiopathic pulmonary fibrosis) ([place]) 11/07/2012  . Ischemic heart disease 07/20/2011  . HYPERKALEMIA 07/16/2010  . HYPERGLYCEMIA 07/16/2010  . HAND PAIN 10/31/2009  . TOBACCO USE, QUIT 10/31/2009  . GERD 08/27/2009  . HEMATOCHEZIA 10/29/2008  . COLONIC POLYPS 03/30/2008  . ABDOMINAL AORTIC ANEURYSM 03/30/2008  . Ulcerative colitis ([place]) 03/30/2008  . NEPHROLITHIASIS, HX OF 03/30/2008  . Hypothyroidism 01/18/2008  . Hyperlipidemia with target LDL less than 70; Statin intolerant 01/18/2008  . Essential hypertension 01/18/2008  . OTHER SPECIFIED ACQUIRED HYPOTHYROIDISM 01/12/2008  . Coronary artery disease involving native heart without angina pectoris 01/12/2008  . Bladder neck obstruction 01/12/2008  .  OTHER DRUG ALLERGY 01/12/2008    Past Surgical History:  Procedure Laterality Date  . ABDOMINAL AORTIC ANEURYSM REPAIR  1992  . CARDIAC CATHETERIZATION  02/2003   Inferior STEMI: dLM 60-70%; LAD heavy Ca2+ - mLAD 70% after D2, small pD1 60-70%, Ostial sm-mod D2 60-70%, Mod pRI 60%, Mod pCx 60%; Large/Dominant RCA - ectatic & severely diseased throughout with ectasia/stenoses & thrombus sub-totalled pre-PDA &rPL. --> Referred for CABG  . CARDIAC CATHETERIZATION N/A 09/21/2016   Procedure:  Right/Left Heart Cath and Coronary/Graft Angiography;  Surgeon: Leonie Man, MD;  Location: [facility];  Service: Cardiovascular;  Laterality: N/A;  . COLONOSCOPY    . CORONARY ARTERY BYPASS GRAFT  2004   4 vessel bypass: LIMA-LAD, SVG-RI, SVG-rPDA-rPL; unable to bypass the small Cx-OM vessels  . NM MYOVIEW LTD  09/2012   There is evidence of a small/moderate scar affecting the inferior wall in the inferolateral wall. There may be slight peri-infarct ischemia. LV Ejection Fraction: 53%.  There is mild hypokinesis of the septum and the apex.  [name] NM MYOVIEW LTD  08/31/2016   Severely reduced LVEF of 28%. Akinesis of the apical lateral and entire inferior-inferolateral wall - consistent with large size severe intensity fixed defect indicative the entire inferior and inferolateral walls as well as apical lateral walls. No evidence of ischemia. Read as HIGH RISK because of low EF.  [name] POLYPECTOMY    . TRANSTHORACIC ECHOCARDIOGRAM  03/2005   Normal LV function with no regional wall motion abnormality.. GR 1 DD. Mild aortic sclerosis without stenosis.  . TRANSTHORACIC ECHOCARDIOGRAM  09/09/2016   Severely reduced LV function with EF 25-30%. Diffuse hypokinesis. GR 1 DD. Diastolic flattening of the septum. Mildly dilated aortic root. Moderate RV dilation with moderately reduced function. PA pressure roughly 50 mmHg.       Home Medications    Prior to Admission medications   Medication Sig Start Date End Date Taking? Authorizing Provider  allopurinol (ZYLOPRIM) 100 MG tablet TAKE TWO TABLETS BY MOUTH ONCE DAILY Patient taking differently: TAKE ONE TABLET (100 MG)  BY MOUTH TWICE DAILY 03/22/16   Cassandria Anger, MD  alum hydroxide-mag trisilicate (GAVISCON) [id number] MG CHEW chewable tablet Chew 2 tablets by mouth 2 (two) times daily as needed for indigestion or heartburn. 10/30/16   Evie Lacks Plotnikov, MD  amLODipine (NORVASC) 10 MG tablet Take 1 tablet (10 mg total) by mouth daily. Take  instead of 5mg  tabs currently prescribed at same time of day.   May take 2 5mg  tabs daily until current Rx complete 09/21/16   Leonie Man, MD  aspirin EC 81 MG tablet Take 81 mg by mouth daily.    Historical Provider, MD  calcium carbonate (TUMS - DOSED IN MG ELEMENTAL CALCIUM) 500 MG chewable tablet Chew 1 tablet by mouth 3 (three) times daily as needed for indigestion or heartburn. Reported on 01/27/2016    Historical Provider, MD  carboxymethylcellulose (REFRESH PLUS) 0.5 % SOLN Place 1-2 drops into both eyes 3 (three) times daily as needed (for eye dryness.).    Historical Provider, MD  carvedilol (COREG) 25 MG tablet TAKE ONE-HALF TABLET BY MOUTH TWICE DAILY WITH A MEAL Patient taking differently: TAKE ONE-HALF (12.5 MG) TABLET BY MOUTH TWICE DAILY WITH A MEAL 03/22/16   Cassandria Anger, MD  cetirizine (ZYRTEC) 10 MG tablet Take 10 mg by mouth daily. ALLERTEC.    Historical Provider, MD  cholecalciferol (VITAMIN D) 1000 units tablet Take 1,000 Units by mouth  2 (two) times daily.    Historical Provider, MD  donepezil (ARICEPT) 10 MG tablet TAKE ONE TABLET BY MOUTH AT BEDTIME 03/09/16   Evie Lacks Plotnikov, MD  folic acid (FOLVITE) [id number] MCG tablet Take 800 mcg by mouth daily.      Historical Provider, MD  furosemide (LASIX) 20 MG tablet Take 1 tablet (20 mg total) by mouth daily as needed. 10/25/16 01/23/17  Leonie Man, MD  gabapentin (NEURONTIN) 300 MG capsule Take 300-600 mg by mouth 2 (two) times daily. 300 MG IN THE MORNING AND 600 MG IN THE EVENING    Historical Provider, MD  ibuprofen (ADVIL,MOTRIN) 200 MG tablet Take 400 mg by mouth 2 (two) times daily as needed for moderate pain. Reported on 01/27/2016    Historical Provider, MD  ipratropium (ATROVENT) 0.06 % nasal spray Place 2 sprays into the nose 4 (four) times daily as needed for rhinitis (runny nose). 02/15/13   Cassandria Anger, MD  levothyroxine (SYNTHROID, LEVOTHROID) 150 MCG tablet Take 1 tablet (150 mcg total) by mouth  daily before breakfast. 11/01/16   Cassandria Anger, MD  losartan (COZAAR) 100 MG tablet TAKE ONE TABLET BY MOUTH ONCE DAILY 03/09/16   Cassandria Anger, MD  Mesalamine 800 MG TBEC TAKE TWO TABLETS BY MOUTH THREE TIMES DAILY Patient taking differently: TAKE THREE TABLETS BY MOUTH (2400 MG) IN THE MORNING & TAKE TWO TABLETS (1600 MG) BY MOUTH IN THE EVENING 09/13/16   Irene Shipper, MD  mirabegron ER (MYRBETRIQ) 50 MG TB24 tablet Take 50 mg by mouth daily.    Historical Provider, MD  Omega-3 Fatty Acids (FISH OIL) 1000 MG CAPS Take 1,000 mg by mouth every evening.     Historical Provider, MD  OVER THE COUNTER MEDICATION Take 1 oz by mouth daily. Liquid Life 1 oz. daily     Historical Provider, MD  primidone (MYSOLINE) 50 MG tablet TAKE ONE TABLET BY MOUTH THREE TIMES DAILY FOR  TREMOR Patient taking differently: TAKE 1 TABLET (50 MG) IN THE MORNING AND 2 TABLETS (100 MG) IN THE EVENING. 03/22/16   Cassandria Anger, MD  Probiotic Product (PROBIOTIC DAILY PO) Take 1 capsule by mouth daily.    Historical Provider, MD  spironolactone (ALDACTONE) 25 MG tablet Take 0.5 tablets (12.5 mg total) by mouth daily. 10/25/16 01/23/17  Leonie Man, MD    Family History Family History  Problem Relation Age of Onset  . Arthritis/Rheumatoid Mother   . Hypertension Father   . Emphysema Father   . Hyperlipidemia Father   . Colon cancer Cousin     mat side  . Lymphoma Sister   . Hypertension Other   . Emphysema Brother   . Hyperlipidemia Sister   . Hyperlipidemia Brother     Social History Social History  Substance Use Topics  . Smoking status: Former Smoker    Packs/day: 2.00    Years: 25.00    Types: Cigarettes    Quit date: 11/29/1966  . Smokeless tobacco: Never Used  . Alcohol use No     Allergies   Lipitor [atorvastatin]; Ramipril; Simvastatin; and Sulfonamide derivatives   Review of Systems Review of Systems  Unable to perform ROS: Dementia  Respiratory: Positive for shortness  of breath.      Physical Exam Updated Vital Signs BP 133/94   Temp 97.5 F (36.4 C)   Resp 16   SpO2 90%    Patient Vitals for the past 24 hrs:  BP Temp Pulse  Resp SpO2  11/23/16 1500 (!) 153/101 - - - (!) 87 %  11/23/16 1445 - - 81 19 -  11/23/16 1415 157/95 - 71 (!) 27 (!) 88 %  11/23/16 1400 137/95 - 70 19 (!) 83 %  11/23/16 1130 126/96 - 79 22 (!) 86 %  11/23/16 1128 - 97.5 F (36.4 C) - - -  11/23/16 1124 133/94 97.5 F (36.4 C) - 16 90 %     Physical Exam 1135: Physical examination:  Nursing notes reviewed; Vital signs and O2 SAT reviewed;  Constitutional: Well developed, Well nourished, Well hydrated, In no acute distress; Head:  Normocephalic, atraumatic; Eyes: EOMI, PERRL, No scleral icterus; ENMT: Mouth and pharynx normal, Mucous membranes moist; Neck: Supple, Full range of motion, No lymphadenopathy; Cardiovascular: Regular rate and rhythm, No gallop; Respiratory: Breath sounds diminished & equal bilaterally, faint wheezes. No audible wheezing. Speaking full sentences, Normal respiratory effort/excursion; Chest: Nontender, Movement normal; Abdomen: Soft, Nontender, Nondistended, Normal bowel sounds; Genitourinary: No CVA tenderness; Extremities: Pulses normal, No tenderness, +1 pedal edema bilat. No calf asymmetry.; Neuro: Awake, alert, confused per hx dementia. Major CN grossly intact.  Speech clear. No facial droop. Moves extremities spontaneously and to command without apparent gross focal motor deficits.; Skin: Color normal, Warm, Dry.   ED Treatments / Results  Labs (all labs ordered are listed, but only abnormal results are displayed)   EKG  EKG Interpretation  Date/Time:  Tuesday November 23 2016 11:20:28 EST Ventricular Rate:  73 PR Interval:    QRS Duration: 109 QT Interval:  445 QTC Calculation: 491 R Axis:   -55 Text Interpretation:  Sinus arrhythmia with 1st degree A-V block Abnormal R-wave progression, early transition Inferior infarct, old  Abnormal lateral Q waves No significant change since last tracing Confirmed by [facility]  MD, Nunzio Cory [phone]) on 11/23/2016 12:31:39 PM       Radiology   Procedures Procedures (including critical care time)  Medications Ordered in ED Medications  albuterol (PROVENTIL) (2.5 MG/3ML) 0.083% nebulizer solution 2.5 mg (not administered)  ipratropium-albuterol (DUONEB) 0.5-2.5 (3) MG/3ML nebulizer solution 3 mL (not administered)     Initial Impression / Assessment and Plan / ED Course  I have reviewed the triage vital signs and the nursing notes.  Pertinent labs & imaging results that were available during my care of the patient were reviewed by me and considered in my medical decision making (see chart for details).  MDM Reviewed: previous chart, nursing note and vitals Reviewed previous: labs and ECG Interpretation: labs, ECG and x-ray   Results for orders placed or performed during the hospital encounter of [id number]  Basic metabolic panel  Result Value Ref Range   Sodium 141 135 - 145 mmol/L   Potassium 4.4 3.5 - 5.1 mmol/L   Chloride 107 101 - 111 mmol/L   CO2 26 22 - 32 mmol/L   Glucose, Bld 124 (H) 65 - 99 mg/dL   BUN 18 6 - 20 mg/dL   Creatinine, Ser 1.40 (H) 0.61 - 1.24 mg/dL   Calcium 9.4 8.9 - 10.3 mg/dL   GFR calc non Af Amer 44 (L) >60 mL/min   GFR calc Af Amer 51 (L) >60 mL/min   Anion gap 8 5 - 15  Brain natriuretic peptide  Result Value Ref Range   B Natriuretic Peptide 1,680.4 (H) 0.0 - 100.0 pg/mL  Troponin I  Result Value Ref Range   Troponin I 0.03 (HH) <0.03 ng/mL  Lactic acid, plasma  Result Value Ref Range  Lactic Acid, Venous 2.6 (HH) 0.5 - 1.9 mmol/L  CBC with Differential  Result Value Ref Range   WBC 6.0 4.0 - 10.5 K/uL   RBC 4.49 4.22 - 5.81 MIL/uL   Hemoglobin 15.0 13.0 - 17.0 g/dL   HCT 46.4 39.0 - 52.0 %   MCV 103.3 (H) 78.0 - 100.0 fL   MCH 33.4 26.0 - 34.0 pg   MCHC 32.3 30.0 - 36.0 g/dL   RDW 14.8 11.5 - 15.5 %   Platelets 77  (L) 150 - 400 K/uL   Neutrophils Relative % 75 %   Neutro Abs 4.5 1.7 - 7.7 K/uL   Lymphocytes Relative 13 %   Lymphs Abs 0.8 0.7 - 4.0 K/uL   Monocytes Relative 9 %   Monocytes Absolute 0.6 0.1 - 1.0 K/uL   Eosinophils Relative 3 %   Eosinophils Absolute 0.2 0.0 - 0.7 K/uL   Basophils Relative 0 %   Basophils Absolute 0.0 0.0 - 0.1 K/uL  Urinalysis, Routine w reflex microscopic  Result Value Ref Range   Color, Urine YELLOW YELLOW   APPearance CLEAR CLEAR   Specific Gravity, Urine 1.016 1.005 - 1.030   pH 5.0 5.0 - 8.0   Glucose, UA NEGATIVE NEGATIVE mg/dL   Hgb urine dipstick NEGATIVE NEGATIVE   Bilirubin Urine NEGATIVE NEGATIVE   Ketones, ur NEGATIVE NEGATIVE mg/dL   Protein, ur 30 (A) NEGATIVE mg/dL   Nitrite NEGATIVE NEGATIVE   Leukocytes, UA NEGATIVE NEGATIVE   RBC / HPF 0-5 0 - 5 RBC/hpf   WBC, UA 0-5 0 - 5 WBC/hpf   Bacteria, UA NONE SEEN NONE SEEN   Squamous Epithelial / LPF 0-5 (A) NONE SEEN   Mucous PRESENT    Hyaline Casts, UA PRESENT    Dg Chest 2 View Result Date: 11/23/2016 CLINICAL DATA:  Short of breath and cough. History of pulmonary fibrosis. EXAM: CHEST  2 VIEW COMPARISON:  07/16/2016 FINDINGS: Prior median sternotomy. Midline trachea. Moderate cardiomegaly. Right superior mediastinal soft tissue fullness is present back to 06/03/2014 and is likely attributed to tortuous aorta and possibly mild adenopathy. Bilateral pleural thickening or trace pleural fluid. No pneumothorax. Severe interstitial lung disease is lower lobe and peripheral predominant. Given this underlying chronic finding, there is no convincing evidence of superimposed acute pneumonia or overt congestive failure. Apparent increase in interstitial prominence is felt to be due to AP portable technique and resultant decreased inspiratory effort. IMPRESSION: Interstitial lung disease, consistent with the history of pulmonary fibrosis. No convincing evidence of acute superimposed process. Cardiomegaly.   No overt congestive failure. Electronically Signed   By: Abigail Miyamoto M.D.   On: 11/23/2016 12:14    1505:  Short neb given on arrival with improvement of pt's O2 Sats to his baseline. BNP elevated, pt does have hx EF 30%; will dose IV lasix. Dx and testing d/w pt and family.  Questions answered.  Verb understanding, agreeable to admit. T/C to [facility] Dr. Gerald Dexter, case discussed, including:  HPI, pertinent PM/SHx, VS/PE, dx testing, ED course and treatment:  Agreeable to admit, requests she will come to the ED for evaluation.   Final Clinical Impressions(s) / ED Diagnoses   Final diagnoses:  None    New Prescriptions New Prescriptions   No medications on file     Francine Graven, DO 11/26/16 1825

## 2016-11-23 NOTE — Progress Notes (Signed)
Patient arrived from ED to 3e16 and assisted into bed.  Patient oriented to room and educated on how to use call bell and phone.  Patient denies pain.  Will continue to monitor.

## 2016-11-23 NOTE — ED Triage Notes (Addendum)
Per EMS- pt diagnosed with pneumonia on Thursday. Taking antibiotics at [facility] assited living. Pt has hx of pulmonary fibrosis and typically sats 85% on 2L that he wears at all times. PT reports increased SOB last night and again this morning. Denies chest pain. Upon arrival to ED 78% on 2L. 20G PIV placed to LFA by EMS.  Pt has dementia but is alert and oriented. DNR at bedside.

## 2016-11-23 NOTE — Addendum Note (Signed)
Addended by: Earnstine Regal on: 11/23/2016 11:08 AM   Modules accepted: Orders

## 2016-11-23 NOTE — H&P (Signed)
History and Physical    Angle Easterly AY:[id number] DOB: May 13, 1929 DOA: 11/23/2016  PCP: [name], MD Patient coming from: Assisted living facility at [facility]  Chief Complaint: Worsening shortness of breath  HPI: Chris Stokes is a 82 y.o. male with medical history significant of pulmonary fibrosis with chronic respiratory failure on continuous supplemental oxygen (he is usually on 2 L O2 and his oxygen saturation around surround 85%), coronary artery disease, status post remote CABG and the recent cath in October of this year showing occlusion of SVG to ramus intermedius, ischemic cardiomyopathy with EF 25-30%, chronic kidney disease, hypertension, BPH, hypothyroidism, mild dementia who presented to ED from the ALF after he was found acutely short of breath this morning.  The family at bedside reported that the patient had been treated for pneumonia and received 5 day course of Levaquin therapy 750 mg daily. Patient also complains of dry nonproductive cough, progressive weakness, nausea but denied chest pain, abdominal pain, diarrhea, bloody stools dysuria hematuria  ED Course:  Upon arrival to the emergency department patient was found to be hypoxic with oxygen saturation 78% on 2 L, CBC showed normal white blood cells count and thrombocytopenia of 77,000. BMP showed elevated creatinine at 1.40, troponin was slightly elevated at 1.40 and he had abnormal BNP of 1680.4 EKG shows sinus rhythm, first-degree AV block and Q waves in the inferior leads that are not new chest x-ray demonstrated interstitial lung disease consistent with the history of IPF no convincing evidence of acute superimposed process cardiomegaly and no overt findings of congestive heart failure   Review of Systems: As per HPI otherwise 10 point review of systems negative.   Ambulatory Status: Uses a wheelchair  Past Medical History:  Diagnosis Date  . Cataract 1999   bilateral cataracts   . Chronic kidney disease      multiple kidney stone flare ups  . Colon polyps    adenomatous  . Coronary artery disease, occlusive    Dr. Mare Ferrari; Cath 02/2003 for Inf STEMI -> dLM CAD with moderate to severe LAD, RI & Cx stenoses, subtotal RCA.--> CABG x 4: LIMA-LAD, SVG-RI, SVG-rPDA-rPL  . GERD (gastroesophageal reflux disease) 2012  . Hiatal hernia   . Hyperlipidemia   . Hypertension   . Hypothyroidism   . Pulmonary fibrosis ([place]) 12/08/2012   on Home O2 (asbestos exposure); Dr. Halford Chessman  . Rectal fissure   . Seasonal allergies   . ST elevation myocardial infarction (STEMI) of inferior wall (HCC) 1984   Inferior MI  . Subsequent ST elevation (STEMI) myocardial infarction of inferior wall 02/2003   Subtotal RCA occlusion -- MV CAD on Cath --> CABG x 4  . Ulcerative colitis    Dr Henrene Pastor    Past Surgical History:  Procedure Laterality Date  . ABDOMINAL AORTIC ANEURYSM REPAIR  1992  . CARDIAC CATHETERIZATION  02/2003   Inferior STEMI: dLM 60-70%; LAD heavy Ca2+ - mLAD 70% after D2, small pD1 60-70%, Ostial sm-mod D2 60-70%, Mod pRI 60%, Mod pCx 60%; Large/Dominant RCA - ectatic & severely diseased throughout with ectasia/stenoses & thrombus sub-totalled pre-PDA &rPL. --> Referred for CABG  . CARDIAC CATHETERIZATION N/A 09/21/2016   Procedure: Right/Left Heart Cath and Coronary/Graft Angiography;  Surgeon: Leonie Man, MD;  Location: [facility];  Service: Cardiovascular;  Laterality: N/A;  . COLONOSCOPY    . CORONARY ARTERY BYPASS GRAFT  2004   4 vessel bypass: LIMA-LAD, SVG-RI, SVG-rPDA-rPL; unable to bypass the small Cx-OM vessels  .  NM MYOVIEW LTD  09/2012   There is evidence of a small/moderate scar affecting the inferior wall in the inferolateral wall. There may be slight peri-infarct ischemia. LV Ejection Fraction: 53%.  There is mild hypokinesis of the septum and the apex.  [name] NM MYOVIEW LTD  08/31/2016   Severely reduced LVEF of 28%. Akinesis of the apical lateral and entire  inferior-inferolateral wall - consistent with large size severe intensity fixed defect indicative the entire inferior and inferolateral walls as well as apical lateral walls. No evidence of ischemia. Read as HIGH RISK because of low EF.  [name] POLYPECTOMY    . TRANSTHORACIC ECHOCARDIOGRAM  03/2005   Normal LV function with no regional wall motion abnormality.. GR 1 DD. Mild aortic sclerosis without stenosis.  . TRANSTHORACIC ECHOCARDIOGRAM  09/09/2016   Severely reduced LV function with EF 25-30%. Diffuse hypokinesis. GR 1 DD. Diastolic flattening of the septum. Mildly dilated aortic root. Moderate RV dilation with moderately reduced function. PA pressure roughly 50 mmHg.    Social History   Social History  . Marital status: Married    Spouse name: N/A  . Number of children: 2  . Years of education: N/A   Occupational History  . retired   .     Social History Main Topics  . Smoking status: Former Smoker    Packs/day: 2.00    Years: 25.00    Types: Cigarettes    Quit date: 11/29/1966  . Smokeless tobacco: Never Used  . Alcohol use No  . Drug use: No  . Sexual activity: No   Other Topics Concern  . Not on file   Social History Narrative  . No narrative on file    Allergies  Allergen Reactions  . Lipitor [Atorvastatin] Other (See Comments)    CRAMPS  . Ramipril Cough  . Simvastatin Other (See Comments)    MYALGIA  . Sulfonamide Derivatives Rash    Family History  Problem Relation Age of Onset  . Arthritis/Rheumatoid Mother   . Hypertension Father   . Emphysema Father   . Hyperlipidemia Father   . Colon cancer Cousin     mat side  . Lymphoma Sister   . Hypertension Other   . Emphysema Brother   . Hyperlipidemia Sister   . Hyperlipidemia Brother     Prior to Admission medications   Medication Sig Start Date End Date Taking? Authorizing Provider  allopurinol (ZYLOPRIM) 100 MG tablet TAKE TWO TABLETS BY MOUTH ONCE DAILY Patient taking differently: TAKE ONE TABLET  (100 MG)  BY MOUTH TWICE DAILY 03/22/16   Cassandria Anger, MD  alum hydroxide-mag trisilicate (GAVISCON) [id number] MG CHEW chewable tablet Chew 2 tablets by mouth 2 (two) times daily as needed for indigestion or heartburn. 10/30/16   Evie Lacks Plotnikov, MD  amLODipine (NORVASC) 10 MG tablet Take 1 tablet (10 mg total) by mouth daily. Take instead of 5mg  tabs currently prescribed at same time of day.   May take 2 5mg  tabs daily until current Rx complete 09/21/16   Leonie Man, MD  aspirin EC 81 MG tablet Take 81 mg by mouth daily.    Historical Provider, MD  calcium carbonate (TUMS - DOSED IN MG ELEMENTAL CALCIUM) 500 MG chewable tablet Chew 1 tablet by mouth 3 (three) times daily as needed for indigestion or heartburn. Reported on 01/27/2016    Historical Provider, MD  carboxymethylcellulose (REFRESH PLUS) 0.5 % SOLN Place 1-2 drops into both eyes 3 (three) times daily as needed (  for eye dryness.).    Historical Provider, MD  carvedilol (COREG) 25 MG tablet TAKE ONE-HALF TABLET BY MOUTH TWICE DAILY WITH A MEAL Patient taking differently: TAKE ONE-HALF (12.5 MG) TABLET BY MOUTH TWICE DAILY WITH A MEAL 03/22/16   Cassandria Anger, MD  cetirizine (ZYRTEC) 10 MG tablet Take 10 mg by mouth daily. ALLERTEC.    Historical Provider, MD  cholecalciferol (VITAMIN D) 1000 units tablet Take 1,000 Units by mouth 2 (two) times daily.    Historical Provider, MD  donepezil (ARICEPT) 10 MG tablet TAKE ONE TABLET BY MOUTH AT BEDTIME 03/09/16   Evie Lacks Plotnikov, MD  folic acid (FOLVITE) [id number] MCG tablet Take 800 mcg by mouth daily.      Historical Provider, MD  furosemide (LASIX) 20 MG tablet Take 1 tablet (20 mg total) by mouth daily as needed. 10/25/16 01/23/17  Leonie Man, MD  gabapentin (NEURONTIN) 300 MG capsule Take 300-600 mg by mouth 2 (two) times daily. 300 MG IN THE MORNING AND 600 MG IN THE EVENING    Historical Provider, MD  ibuprofen (ADVIL,MOTRIN) 200 MG tablet Take 400 mg by mouth 2 (two) times  daily as needed for moderate pain. Reported on 01/27/2016    Historical Provider, MD  ipratropium (ATROVENT) 0.06 % nasal spray Place 2 sprays into the nose 4 (four) times daily as needed for rhinitis (runny nose). 02/15/13   Cassandria Anger, MD  levothyroxine (SYNTHROID, LEVOTHROID) 150 MCG tablet Take 1 tablet (150 mcg total) by mouth daily before breakfast. 11/01/16   Cassandria Anger, MD  losartan (COZAAR) 100 MG tablet TAKE ONE TABLET BY MOUTH ONCE DAILY 03/09/16   Cassandria Anger, MD  Mesalamine 800 MG TBEC TAKE TWO TABLETS BY MOUTH THREE TIMES DAILY Patient taking differently: TAKE THREE TABLETS BY MOUTH (2400 MG) IN THE MORNING & TAKE TWO TABLETS (1600 MG) BY MOUTH IN THE EVENING 09/13/16   Irene Shipper, MD  mirabegron ER (MYRBETRIQ) 50 MG TB24 tablet Take 50 mg by mouth daily.    Historical Provider, MD  Omega-3 Fatty Acids (FISH OIL) 1000 MG CAPS Take 1,000 mg by mouth every evening.     Historical Provider, MD  OVER THE COUNTER MEDICATION Take 1 oz by mouth daily. Liquid Life 1 oz. daily     Historical Provider, MD  primidone (MYSOLINE) 50 MG tablet TAKE ONE TABLET BY MOUTH THREE TIMES DAILY FOR  TREMOR Patient taking differently: TAKE 1 TABLET (50 MG) IN THE MORNING AND 2 TABLETS (100 MG) IN THE EVENING. 03/22/16   Cassandria Anger, MD  Probiotic Product (PROBIOTIC DAILY PO) Take 1 capsule by mouth daily.    Historical Provider, MD  spironolactone (ALDACTONE) 25 MG tablet Take 0.5 tablets (12.5 mg total) by mouth daily. 10/25/16 01/23/17  Leonie Man, MD    Physical Exam: Vitals:   11/23/16 1445 11/23/16 1500 11/23/16 1638 11/23/16 1713  BP:  (!) 153/101 96/73   Pulse: 81  89 74  Resp: 19  (!) 22 18  Temp:      SpO2:  (!) 87% (!) 85% (!) 86%  Weight:   82.8 kg (182 lb 8 oz)   Height:   5\' 10"  (1.778 m)      General: Appears calm and comfortable Eyes: PERRLA, EOMI, normal lids, iris ENT:  grossly normal hearing, lips & tongue, mucous membranes moist and  intact Neck: no lymphoadenopathy, masses or thyromegaly Cardiovascular: RRR, no [email or web address] JVD, carotid bruits. No LE edema.  Respiratory: bilateral no wheezes, rales, rhonchi, scattered cracles. Normal respiratory effort. No accessory muscle use observed Abdomen: soft, non-tender, non-distended, no organomegaly or masses appreciated. BS present in all quadrants Skin: no rash, ulcers or induration seen on limited exam Musculoskeletal: grossly normal tone BUE/BLE, good ROM, no bony abnormality or joint deformities observed Psychiatric: grossly normal mood and affect, speech fluent and appropriate, alert and oriented x3 Neurologic: CN II-XII grossly intact, moves all extremities in coordinated fashion, sensation intact  Labs on Admission: I have personally reviewed following labs and imaging studies  CBC, BMP  GFR: Estimated Creatinine Clearance: 39.8 mL/min (by C-G formula based on SCr of 1.4 mg/dL (H)).   Creatinine Clearance: Estimated Creatinine Clearance: 39.8 mL/min (by C-G formula based on SCr of 1.4 mg/dL (H)).    Radiological Exams on Admission: Dg Chest 2 View  Result Date: 11/23/2016 CLINICAL DATA:  Short of breath and cough. History of pulmonary fibrosis. EXAM: CHEST  2 VIEW COMPARISON:  07/16/2016 FINDINGS: Prior median sternotomy. Midline trachea. Moderate cardiomegaly. Right superior mediastinal soft tissue fullness is present back to 06/03/2014 and is likely attributed to tortuous aorta and possibly mild adenopathy. Bilateral pleural thickening or trace pleural fluid. No pneumothorax. Severe interstitial lung disease is lower lobe and peripheral predominant. Given this underlying chronic finding, there is no convincing evidence of superimposed acute pneumonia or overt congestive failure. Apparent increase in interstitial prominence is felt to be due to AP portable technique and resultant decreased inspiratory effort. IMPRESSION: Interstitial lung disease, consistent with the  history of pulmonary fibrosis. No convincing evidence of acute superimposed process. Cardiomegaly.  No overt congestive failure. Electronically Signed   By: Abigail Miyamoto M.D.   On: 11/23/2016 12:14   EKG: Independently reviewed - sinus rhythm, first-degree AV block and Q waves in the inferior leads  Assessment/Plan Principal Problem:   Acute on chronic respiratory failure with hypoxia (HCC) Active Problems:   Hypothyroidism   Hyperlipidemia with target LDL less than 70; Statin intolerant   Essential hypertension   Coronary artery disease involving native heart without angina pectoris   IPF (idiopathic pulmonary fibrosis) (HCC)   Systolic CHF (HCC)   AKI (acute kidney injury) ([place])   Acute on chronic respiratory failure with hypoxia Probably multifactorial and associated with underlying pulmonary fibrosis, recently treated pneumonia in the assisted living facility, probable fluid volume overload given his low EF and elevated BNP, although there are no findings of CHF on x-ray. 1 dose of IV Lasix 40 mg given in the emergency department Continue to monitor daily weights and urinary output, nebulizer therapy, migratory blood cells count and fevers if needed and if needed to restart antibiotic therapy Will give one dose of Solu-Medrol IV 125 mg  Continue supplemental oxygen and monitor O2 saturations PE could not be completely excluded, although his Wells PE score is low; given his h/o IPF and pulmonary HTN, VQ scan might not be reliable.  At this point will refrain from ordering CTPA scan given acute renal insufficiency. He was given therapeutic dose of Lovenox and if renal function returns to normal and condition does not improve, consider CT scan to r/o PE  Ischemic cardiomyopathy with EF 25-30% As mentioned above, he was given a dose of Lasix 40 mg IV - monitor response to this intervention and continue monitor daily weights, urinary output Follow renal indices Patient might need an  adjustment of the home doses of diuretics - he is on Aldactone and Lasix at home  Acute kidney injury Creatinine  is 1.40 and creatinine clearance is >30 Continue to monitor, adjust medications doses to renal  Thrombocytopenia  Continue to monitor especially after the therapeutic dose of Lovenox This is not a new problem and patient was supposed to follow-up with oncologist and ambulatory setting if condition continues to worsen  Hyperthyroidism - most recent TSH Continue Synthroid    DVT prophylaxis: Lovenox Code Status: DNR  Family Communication: at bedside Disposition Plan: Telemetry  Consults called: None Admission status: inpatient   York Grice, [place] Pager: [phone] [facility]  If 7PM-7AM, please contact night-coverage [email or web address] Password [email or web address]  11/23/2016, 5:29 PM

## 2016-11-23 NOTE — Progress Notes (Signed)
Patient's oxygen saturation 85-87% on 5L while resting in bed.  With movement, patient desaturates to 78-80% and will recover into the high 80s upon rest.  Dr. Aggie Moats made aware.  Goal is for oxygen saturation between 85-88%.  Will continue to monitor.

## 2016-11-23 NOTE — ED Notes (Signed)
Charted an error. Wasn't Catheterized, the urine was a clean catch.

## 2016-11-24 ENCOUNTER — Inpatient Hospital Stay (HOSPITAL_COMMUNITY): Payer: PPO

## 2016-11-24 DIAGNOSIS — J9621 Acute and chronic respiratory failure with hypoxia: Principal | ICD-10-CM

## 2016-11-24 DIAGNOSIS — E785 Hyperlipidemia, unspecified: Secondary | ICD-10-CM

## 2016-11-24 DIAGNOSIS — M79609 Pain in unspecified limb: Secondary | ICD-10-CM

## 2016-11-24 DIAGNOSIS — J84112 Idiopathic pulmonary fibrosis: Secondary | ICD-10-CM

## 2016-11-24 LAB — URINE CULTURE: CULTURE: NO GROWTH

## 2016-11-24 LAB — BASIC METABOLIC PANEL
ANION GAP: 6 (ref 5–15)
BUN: 17 mg/dL (ref 6–20)
CALCIUM: 9.2 mg/dL (ref 8.9–10.3)
CHLORIDE: 104 mmol/L (ref 101–111)
CO2: 29 mmol/L (ref 22–32)
Creatinine, Ser: 1.21 mg/dL (ref 0.61–1.24)
GFR calc non Af Amer: 53 mL/min — ABNORMAL LOW (ref >60)
Glucose, Bld: 130 mg/dL — ABNORMAL HIGH (ref 65–99)
POTASSIUM: 4 mmol/L (ref 3.5–5.1)
Sodium: 139 mmol/L (ref 135–145)

## 2016-11-24 LAB — CBC
HEMATOCRIT: 44.9 % (ref 39.0–52.0)
HEMOGLOBIN: 14.7 g/dL (ref 13.0–17.0)
MCH: 33.6 pg (ref 26.0–34.0)
MCHC: 32.7 g/dL (ref 30.0–36.0)
MCV: 102.7 fL — AB (ref 78.0–100.0)
Platelets: 71 10*3/uL — ABNORMAL LOW (ref 150–400)
RBC: 4.37 MIL/uL (ref 4.22–5.81)
RDW: 14.4 % (ref 11.5–15.5)
WBC: 5.2 10*3/uL (ref 4.0–10.5)

## 2016-11-24 LAB — UREA NITROGEN, URINE: Urea Nitrogen, Ur: 82 mg/dL

## 2016-11-24 LAB — TROPONIN I: Troponin I: 0.03 ng/mL (ref <0.03)

## 2016-11-24 MED ORDER — SPIRONOLACTONE 25 MG PO TABS
12.5000 mg | ORAL_TABLET | Freq: Every day | ORAL | Status: DC
  Administered 2016-11-24 – 2016-12-01 (×8): 12.5 mg via ORAL
  Filled 2016-11-24 (×8): qty 1

## 2016-11-24 MED ORDER — IPRATROPIUM-ALBUTEROL 0.5-2.5 (3) MG/3ML IN SOLN
3.0000 mL | Freq: Three times a day (TID) | RESPIRATORY_TRACT | Status: DC
  Administered 2016-11-24 – 2016-11-28 (×12): 3 mL via RESPIRATORY_TRACT
  Filled 2016-11-24 (×13): qty 3

## 2016-11-24 MED ORDER — CARVEDILOL 12.5 MG PO TABS
12.5000 mg | ORAL_TABLET | Freq: Two times a day (BID) | ORAL | Status: DC
  Administered 2016-11-24 – 2016-12-01 (×14): 12.5 mg via ORAL
  Filled 2016-11-24 (×10): qty 1
  Filled 2016-11-24: qty 2
  Filled 2016-11-24 (×3): qty 1

## 2016-11-24 MED ORDER — FUROSEMIDE 20 MG PO TABS
20.0000 mg | ORAL_TABLET | Freq: Every day | ORAL | Status: DC
  Administered 2016-11-24: 20 mg via ORAL
  Filled 2016-11-24: qty 1

## 2016-11-24 MED ORDER — TECHNETIUM TC 99M DIETHYLENETRIAME-PENTAACETIC ACID: 31.3000 | Freq: Once | INTRAVENOUS | Status: DC | PRN

## 2016-11-24 MED ORDER — DONEPEZIL HCL 10 MG PO TABS
10.0000 mg | ORAL_TABLET | Freq: Every day | ORAL | Status: DC
  Administered 2016-11-24 – 2016-11-30 (×7): 10 mg via ORAL
  Filled 2016-11-24 (×7): qty 1

## 2016-11-24 MED ORDER — LOSARTAN POTASSIUM 50 MG PO TABS
100.0000 mg | ORAL_TABLET | Freq: Every day | ORAL | Status: DC
  Administered 2016-11-24 – 2016-12-01 (×8): 100 mg via ORAL
  Filled 2016-11-24 (×9): qty 2

## 2016-11-24 MED ORDER — TECHNETIUM TO 99M ALBUMIN AGGREGATED
4.2000 | Freq: Once | INTRAVENOUS | Status: AC | PRN
  Administered 2016-11-24: 4.2 via INTRAVENOUS

## 2016-11-24 NOTE — Consult Note (Signed)
[facility] Nurse wound consult note Reason for Consult:stage I sacrum Wound type:stage I Pressure Ulcer POA: Yes Measurement: 8cm x 7cm x 0cm Wound bed: na Drainage (amount, consistency, odor) none Periwound:intact Dressing procedure/placement/frequency:I have provided nurses with orders for Allevyn sacral dressing to protect sacral area, placed one today, orders to change prn.  Instructed pt and family of importance of changing position.  Pt lies in laid back recliner all day at home.  Demonstrated amount of turn needed to allow this to heal and prevent further deterioration. We will not follow, but will remain available to this patient, to nursing, and the medical and/or surgical teams.  Please re-consult if we need to assist further.     Fara Olden, RN-C, WTA-[facility]

## 2016-11-24 NOTE — Progress Notes (Signed)
VASCULAR LAB PRELIMINARY  ARTERIAL  ABI completed:    RIGHT    LEFT    PRESSURE WAVEFORM  PRESSURE WAVEFORM  BRACHIAL 119 Triphasic BRACHIAL 127 Triphasic  DP 141 Triphasic DP 136 Triphasic  PT 159 Triphasic PT 158 Triphasic  GREAT TOE 0.89 NA GREAT TOE 0.97 NA    RIGHT LEFT  ABI 1.2 1.2     Cailyn Houdek D, RVT 11/24/2016, 4:13 PM

## 2016-11-24 NOTE — Consult Note (Signed)
Name: Chris Stokes MRN: [id number] DOB: [birth year]    ADMISSION DATE:  11/23/2016 CONSULTATION DATE:  11/24/2016  REFERRING MD :  Chris Stokes  CHIEF COMPLAINT:  Worsening hypoxia   HISTORY OF PRESENT ILLNESS:  82 year old remote smoker with no need hepatic pulmonary fibrosis maintained on 2 L oxygen , has seen my partner Chris Stokes in the office. History is obtained from the patient and his daughter Chris Stokes the bedside. She reports gradually worsening dyspnea over the past few months. He change her cardiologist from [facility] to [facility] who assessed and found that he had a new cardiomyopathy and collapse of one of his venous grafts-but only medical treatment was advised. For his IPF, he was tried on Ofev earlier in the year but did not tolerate due to GI side effects, note that he also has a history of ulcerative colitis. He is just moved to assisted living with his wife for a significant debility due to strokes He was felt to have pneumonia and treated with 5 days of Levaquin about a week ago. He reports a nonproductive cough, no preceding URI symptoms are fevers. He developed increased dyspnea and respiratory distress and hypoxia with saturations of 78% on 2 L and was admitted via the emergency room. Initial labs showed a BNP of 1680 and creatinine of 1.4-he was diuresed with Lasix and feels marginally better He is currently on 5 L oxygen and reports dyspnea on minimal exertion   SIGNIFICANT EVENTS    STUDIES:  10/2012 CT chest noncontrast >> UIP pattern of fibrosis 12/27 VQ scan negative     PAST MEDICAL HISTORY :   has a past medical history of Cataract (1999); Chronic kidney disease; Colon polyps; Coronary artery disease, occlusive; GERD (gastroesophageal reflux disease) (2012); Hiatal hernia; Hyperlipidemia; Hypertension; Hypothyroidism; Pulmonary fibrosis (Chris Stokes) (12/08/2012); Rectal fissure; Seasonal allergies; ST elevation myocardial infarction (STEMI) of inferior wall ([place]) (1984);  Subsequent ST elevation (STEMI) myocardial infarction of inferior wall (02/2003); and Ulcerative colitis.  has a past surgical history that includes Abdominal aortic aneurysm repair (1992); Coronary artery bypass graft (2004); Colonoscopy; Polypectomy; transthoracic echocardiogram (03/2005); NM MYOVIEW LTD (09/2012); Cardiac catheterization (02/2003); NM MYOVIEW LTD (08/31/2016); transthoracic echocardiogram (09/09/2016); and Cardiac catheterization (N/A, 09/21/2016). Prior to Admission medications   Medication Sig Start Date End Date Taking? Authorizing Provider  allopurinol (ZYLOPRIM) 100 MG tablet TAKE TWO TABLETS BY MOUTH ONCE DAILY Patient taking differently: Take 200 mg by mouth in the morning 03/22/16  Yes Chris Anger, MD  alum hydroxide-mag trisilicate (GAVISCON) [id number] MG CHEW chewable tablet Chew 2 tablets by mouth 2 (two) times daily as needed for indigestion or heartburn. 10/30/16  Yes Chris Lacks Plotnikov, MD  amLODipine (NORVASC) 10 MG tablet Take 1 tablet (10 mg total) by mouth daily. Take instead of 5mg  tabs currently prescribed at same time of day.   May take 2 5mg  tabs daily until current Rx complete Patient taking differently: Take 10 mg by mouth daily.  09/21/16  Yes Chris Man, MD  aspirin EC 81 MG tablet Take 81 mg by mouth daily.   Yes Historical Provider, MD  calcium carbonate (TUMS - DOSED IN MG ELEMENTAL CALCIUM) 500 MG chewable tablet Chew 1 tablet by mouth 3 (three) times daily as needed for indigestion or heartburn. Reported on 01/27/2016   Yes Historical Provider, MD  carboxymethylcellulose (REFRESH PLUS) 0.5 % SOLN Place 1 drop into both eyes 3 (three) times daily as needed (for dry eyes).    Yes Historical Provider, MD  carvedilol (  COREG) 12.5 MG tablet Take 12.5 mg by mouth 2 (two) times daily with a meal.   Yes Historical Provider, MD  cetirizine (ZYRTEC) 10 MG tablet Take 10 mg by mouth daily.    Yes Historical Provider, MD  cholecalciferol (VITAMIN D) 1000  units tablet Take 1,000 Units by mouth 2 (two) times daily.   Yes Historical Provider, MD  donepezil (ARICEPT) 10 MG tablet TAKE ONE TABLET BY MOUTH AT BEDTIME Patient taking differently: Take 10 mg by mouth at bedtime 03/09/16  Yes Chris Anger, MD  folic acid (FOLVITE) [id number] MCG tablet Take 800 mcg by mouth daily.     Yes Historical Provider, MD  furosemide (LASIX) 40 MG tablet Take 40 mg by mouth daily as needed (for a weight gain of 3 pounds in a day or worsening edema).   Yes Historical Provider, MD  gabapentin (NEURONTIN) 300 MG capsule Take 300-600 mg by mouth See admin instructions. 300 MG IN THE MORNING AND 600 MG IN THE EVENING   Yes Historical Provider, MD  ibuprofen (ADVIL,MOTRIN) 200 MG tablet Take 400 mg by mouth 2 (two) times daily as needed for moderate pain. Reported on 01/27/2016   Yes Historical Provider, MD  ipratropium (ATROVENT) 0.06 % nasal spray Place 2 sprays into the nose 4 (four) times daily as needed for rhinitis (runny nose). Patient taking differently: Place 2 sprays into both nostrils 4 (four) times daily as needed for rhinitis.  02/15/13  Yes Chris Lacks Plotnikov, MD  levofloxacin (LEVAQUIN) 750 MG tablet Take 750 mg by mouth daily. For 7 days   Yes Historical Provider, MD  levothyroxine (SYNTHROID, LEVOTHROID) 137 MCG tablet Take 137 mcg by mouth daily before breakfast.   Yes Historical Provider, MD  losartan (COZAAR) 100 MG tablet TAKE ONE TABLET BY MOUTH ONCE DAILY Patient taking differently: Take 100 mg by mouth in the morning 03/09/16  Yes Chris Lacks Plotnikov, MD  Mesalamine 800 MG TBEC TAKE TWO TABLETS BY MOUTH THREE TIMES DAILY Patient taking differently: Take 1,600 mg by mouth three times a day 09/13/16  Yes Chris Shipper, MD  Omega-3 Fatty Acids (FISH OIL) 1000 MG CAPS Take 1,000 mg by mouth every evening.    Yes Historical Provider, MD  OXYGEN Inhale 2 L/min into the lungs See admin instructions. Via nasal cannula as needed for activity and sleep   Yes  Historical Provider, MD  primidone (MYSOLINE) 50 MG tablet TAKE ONE TABLET BY MOUTH THREE TIMES DAILY FOR  TREMOR Patient taking differently: Take 50 mg by mouth three times a day for tremor(s) 03/22/16  Yes Chris Anger, MD  Probiotic Product (PROBIOTIC DAILY PO) Take 1 capsule by mouth daily.   Yes Historical Provider, MD  spironolactone (ALDACTONE) 25 MG tablet Take 0.5 tablets (12.5 mg total) by mouth daily. 10/25/16 01/23/17 Yes Chris Man, MD  tolterodine (DETROL LA) 4 MG 24 hr capsule Take 4 mg by mouth daily.   Yes Historical Provider, MD  UNABLE TO FIND Liquid Life: Drink 1 ounce by mouth every morning   Yes Historical Provider, MD  carvedilol (COREG) 25 MG tablet TAKE ONE-HALF TABLET BY MOUTH TWICE DAILY WITH A MEAL Patient not taking: Reported on 11/23/2016 03/22/16   Chris Lacks Plotnikov, MD  furosemide (LASIX) 20 MG tablet Take 1 tablet (20 mg total) by mouth daily as needed. Patient not taking: Reported on 11/23/2016 10/25/16 01/23/17  Chris Man, MD  levothyroxine (SYNTHROID, LEVOTHROID) 150 MCG tablet Take 1 tablet (150 mcg  total) by mouth daily before breakfast. Patient not taking: Reported on 11/23/2016 11/01/16   Chris Lacks Plotnikov, MD  mirabegron ER (MYRBETRIQ) 50 MG TB24 tablet Take 50 mg by mouth daily.    Historical Provider, MD   Allergies  Allergen Reactions  . Lipitor [Atorvastatin] Other (See Comments)    CRAMPS  . Ramipril Cough  . Simvastatin Other (See Comments)    MYALGIA  . Sulfonamide Derivatives Rash    FAMILY HISTORY:  family history includes Arthritis/Rheumatoid in his mother; Colon cancer in his cousin; Emphysema in his brother and father; Hyperlipidemia in his brother, father, and sister; Hypertension in his father and other; Lymphoma in his sister. SOCIAL HISTORY:  reports that he quit smoking about 50 years ago. His smoking use included Cigarettes. He has a 50.00 pack-year smoking history. He has never used smokeless tobacco. He reports  that he does not drink alcohol or use drugs.  REVIEW OF SYSTEMS:   Positive for fatigue, shortness of breath, dry nonproductive cough  Constitutional: Negative for fever, chills, weight loss,  and diaphoresis.  HENT: Negative for hearing loss, ear pain, nosebleeds, congestion, sore throat, neck pain, tinnitus and ear discharge.   Eyes: Negative for blurred vision, double vision, photophobia, pain, discharge and redness.  Respiratory: Negative for  hemoptysis, sputum production,  wheezing and stridor.   Cardiovascular: Negative for chest pain, palpitations, orthopnea, claudication, leg swelling and PND.  Gastrointestinal: Negative for heartburn, nausea, vomiting, abdominal pain, diarrhea, constipation, blood in stool and melena.  Genitourinary: Negative for dysuria, urgency, frequency, hematuria and flank pain.  Musculoskeletal: Negative for myalgias, back pain, joint pain and falls.  Skin: Negative for itching and rash.  Neurological: Negative for dizziness, tingling, tremors, sensory change, speech change, focal weakness, seizures, loss of consciousness, weakness and headaches.  Endo/Heme/Allergies: Negative for environmental allergies and polydipsia. Does not bruise/bleed easily.  SUBJECTIVE:   VITAL SIGNS: Temp:  [97.4 F (36.3 C)-98.4 F (36.9 C)] 97.5 F (36.4 C) (12/27 1253) Pulse Rate:  [70-91] 74 (12/27 1253) Resp:  [18-27] 20 (12/27 1253) BP: (96-157)/(73-101) 136/87 (12/27 1253) SpO2:  [79 %-92 %] 87 % (12/27 1253) FiO2 (%):  [35 %] 35 % (12/26 2135) Weight:  [176 lb 4.8 oz (80 kg)-182 lb 8 oz (82.8 kg)] 176 lb 4.8 oz (80 kg) (12/27 0453)  PHYSICAL EXAMINATION: Gen. Pleasant, well-nourished, in no distress, normal affect ENT - no lesions, no post nasal drip Neck: No JVD, no thyromegaly, no carotid bruits Lungs: no use of accessory muscles, no dullness to percussion, bibasal fine rales, no rhonchi  Cardiovascular: Rhythm regular, heart sounds  normal, no murmurs or  gallops, no peripheral edema Abdomen: soft and non-tender, no hepatosplenomegaly, BS normal. Musculoskeletal: No deformities, no cyanosis or clubbing Neuro:  alert, non focal    Recent Labs Lab 11/23/16 1140 11/23/16 1700 11/24/16 0407  NA 141  --  139  K 4.4  --  4.0  CL 107  --  104  CO2 26  --  29  BUN 18  --  17  CREATININE 1.40* 1.20 1.21  GLUCOSE 124*  --  130*    Recent Labs Lab 11/23/16 1140 11/23/16 1700 11/24/16 0407  HGB 15.0 16.5 14.7  HCT 46.4 50.4 44.9  WBC 6.0 6.8 5.2  PLT 77* 81* 71*   Dg Chest 2 View  Result Date: 11/23/2016 CLINICAL DATA:  Short of breath and cough. History of pulmonary fibrosis. EXAM: CHEST  2 VIEW COMPARISON:  07/16/2016 FINDINGS: Prior median sternotomy.  Midline trachea. Moderate cardiomegaly. Right superior mediastinal soft tissue fullness is present back to 06/03/2014 and is likely attributed to tortuous aorta and possibly mild adenopathy. Bilateral pleural thickening or trace pleural fluid. No pneumothorax. Severe interstitial lung disease is lower lobe and peripheral predominant. Given this underlying chronic finding, there is no convincing evidence of superimposed acute pneumonia or overt congestive failure. Apparent increase in interstitial prominence is felt to be due to AP portable technique and resultant decreased inspiratory effort. IMPRESSION: Interstitial lung disease, consistent with the history of pulmonary fibrosis. No convincing evidence of acute superimposed process. Cardiomegaly.  No overt congestive failure. Electronically Signed   By: Abigail Miyamoto M.D.   On: 11/23/2016 12:14   Nm Pulmonary Perf And Vent  Result Date: 11/24/2016 CLINICAL DATA:  Chronic respiratory failure through severe hypoxia. Congestive heart failure. EXAM: NUCLEAR MEDICINE VENTILATION - PERFUSION LUNG SCAN TECHNIQUE: Ventilation images were obtained in multiple projections using inhaled aerosol Tc-31m DTPA. Perfusion images were obtained in multiple  projections after intravenous injection of Tc-67m MAA. RADIOPHARMACEUTICALS:  31.3 mCi Technetium-73m DTPA aerosol inhalation and 4.2 mCi Technetium-41m MAA IV COMPARISON:  Focal full FINDINGS: Ventilation: No significant focal ventilation defect. Perfusion: No wedge shaped peripheral perfusion defects to suggest acute pulmonary embolism. IMPRESSION: No evidence suggestive recent pulmonary embolism. Electronically Signed   By: Lorriane Shire M.D.   On: 11/24/2016 10:11    ASSESSMENT / PLAN:  Acute on chronic respiratory failure Idiopathic pulmonary fibrosis Ischemic cardiomyopathy/ acute systolic heart failure  Cause of his gradual worsening dyspnea could be related to worsening pulmonary fibrosis, he does not seem to be fluid overloaded although the BNP was high and diuresis would be indicated. The absence of leukocytosis on her lobar infiltrate I would doubt pneumonia. Hopefully for him, this  IPF flare will be self-limited.  Recommend- High-resolution CT of the chest to look for worsening fibrosis Unfortunately he has not tolerated medications in the past due to GI side effects  Continue diuresis as you are doing- careful with losartan and Aldactone in the setting of renal insufficiency  Review oxygen requirements close to discharge-he will likely require higher O2 during exertion, currently on 5 L at rest. He may also benefit from short-term rehabilitation facility once his acute symptoms of subsided  Kara Mead MD. FCCP. [facility] Pager [phone] If no response call [phone]    11/24/2016, 1:34 PM

## 2016-11-24 NOTE — Consult Note (Signed)
   [facility] CM Inpatient Consult   11/24/2016  Chris Stokes 13-Feb-1929 [id number]   Patient screened for [place] Management services. Went to bedside to offer and explain [facility] Care Management program with patient through his [employer] plan. Patient and daughter, [name] was at the bedside. Patient verified HIPAA information.   [name] [facility]) states the patient has been at [facility] for a little over a month and they are trying to get acclimated to their services being availalble to him there. She states she would like to review the information and she was trying to make sure he was getting the things they anticipated at [facility].  He is to be seen by the facility MD as his new primary care provider but they were actively seeing Dr. Tyrone Apple Plotnikov prior to going to the facility.  She politely declined [facility] Care Management follow up. Accepted [facility] Care Management brochure with contact information to call in future if changes mind. Explained to them that [facility] does not interfere with or replace any services arranged by the inpatient RNCM.  They verbalized understanding as the daughter was concern for having home health care arranged.   For questions, please contact:  Natividad Brood, RN BSN [facility] Liaison  [phone] business mobile phone Toll free office [phone]

## 2016-11-24 NOTE — Progress Notes (Signed)
PROGRESS NOTE                                                                                                                                                                                                             Patient Demographics:    Chris Stokes, is a 82 y.o. male, DOB - February 19, 1929, [id number]  Admit date - 11/23/2016   Admitting Physician Elwin Mocha, MD  Outpatient Primary MD for the patient is [name], MD  LOS - 1  Outpatient Specialists: Pulm dr Halford Chessman, Cards dr Ellyn Hack  Chief Complaint  Patient presents with  . Pneumonia  . Shortness of Breath       Brief Narrative   82 y.o. male with a Past Medical History low thyroid, HLD, IPF, HTN who presents with SOB. Dx acute on chronic resp failure.    Subjective:    Chris Stokes today has, No headache, No chest pain, No abdominal pain -reports SOB, weakness.   Assessment  & Plan :    Principal Problem:   Acute on chronic respiratory failure with hypoxia (HCC) Active Problems:   Hypothyroidism   Hyperlipidemia with target LDL less than 70; Statin intolerant   Essential hypertension   Coronary artery disease involving native heart without angina pectoris   IPF (idiopathic pulmonary fibrosis) (HCC)   Systolic CHF (HCC)   AKI (acute kidney injury) (HCC)   Thrombocytopenia (HCC)   Pressure injury of skin  Acute on chronic hypoxic respiratory failure - VQ scan negative for PE, will stop Lovenox - No evidence of volume overload on chest x-ray or clinical exam, but significantly elevated BNP, known cardiomyopathy at baseline. - With known history of IPF, unknown is a major contributing factor to his respiratory failure, pulmonary consulted.  Ischemic  cardiomyopathy with EF 25-30% - Most recent echo 08/2016 with EF 25-30% with diffuse hypokinesis and grade 1 diastolic dysfunction - continuewith Coreg, Cozaar and Aldactone, daily weight, strict ins and outs, will keep on  low-dose Lasix.  IPF - Continue with oxygen, PCCM consulted  Thrombocytopenia - New onset, will continue to monitor, patient usually baseline on the lower side 120s to 130s, no evidence of active bleed, no indication for transfusion  Hyperlipidemia - Continue statin  Hypertension - Continue with home medication  Hypothyroidism - Continue Synthroid  Code Status : DNR  Family  Communication  : Wife and daughter at bedside  Disposition Plan  : Home when stable  Consults  :  PCCM  Procedures  : None  DVT Prophylaxis  :  SCDs , no chemical prophylaxis secondary to significant thrombocytopenia  Lab Results  Component Value Date   PLT 71 (L) 11/24/2016    Antibiotics  :   Anti-infectives    Start     Dose/Rate Route Frequency Ordered Stop   11/23/16 1800  cefTRIAXone (ROCEPHIN) 1 g in dextrose 5 % 50 mL IVPB  Status:  Discontinued     1 g 100 mL/hr over 30 Minutes Intravenous Every 24 hours 11/23/16 1628 11/23/16 1705   11/23/16 1800  azithromycin (ZITHROMAX) 500 mg in dextrose 5 % 250 mL IVPB  Status:  Discontinued     500 mg 250 mL/hr over 60 Minutes Intravenous Every 24 hours 11/23/16 1628 11/23/16 1705        Objective:   Vitals:   11/23/16 2300 11/23/16 2355 11/24/16 0453 11/24/16 1253  BP:  117/74 131/85 136/87  Pulse:  91 70 74  Resp:  (!) 22 20 20   Temp:  98.4 F (36.9 C) 98.2 F (36.8 C) 97.5 F (36.4 C)  TempSrc:  Oral Oral Oral  SpO2: (!) 87% 92% (!) 84% (!) 87%  Weight:   80 kg (176 lb 4.8 oz)   Height:        Wt Readings from Last 3 Encounters:  11/24/16 80 kg (176 lb 4.8 oz)  11/01/16 83.5 kg (184 lb)  10/25/16 83.6 kg (184 lb 6.4 oz)     Intake/Output Summary (Last 24 hours) at 11/24/16 1316 Last data filed at 11/24/16 0916  Gross per 24 hour  Intake              240 ml  Output             2279 ml  Net            -2039 ml     Physical Exam  Awake Alert, Oriented X 3,frail. Supple Neck,No JVD.  Symmetrical Chest wall movement,  Good air movement bilaterally, no wheezing or crackles RRR,No Gallops,Rubs or new Murmurs, No Parasternal Heave +ve B.Sounds, Abd Soft, No tenderness,  No rebound - guarding or rigidity. No Cyanosis, Clubbing or edema, No new Rash or bruise     Data Review:    CBC  Recent Labs Lab 11/23/16 1140 11/23/16 1700 11/24/16 0407  WBC 6.0 6.8 5.2  HGB 15.0 16.5 14.7  HCT 46.4 50.4 44.9  PLT 77* 81* 71*  MCV 103.3* 104.8* 102.7*  MCH 33.4 34.3* 33.6  MCHC 32.3 32.7 32.7  RDW 14.8 14.7 14.4  LYMPHSABS 0.8  --   --   MONOABS 0.6  --   --   EOSABS 0.2  --   --   BASOSABS 0.0  --   --     Chemistries   Recent Labs Lab 11/23/16 1140 11/23/16 1700 11/23/16 2236 11/24/16 0407  NA 141  --   --  139  K 4.4  --   --  4.0  CL 107  --   --  104  CO2 26  --   --  29  GLUCOSE 124*  --   --  130*  BUN 18  --   --  17  CREATININE 1.40* 1.20  --  1.21  CALCIUM 9.4  --   --  9.2  MG  --   --  1.8  --    ------------------------------------------------------------------------------------------------------------------ No results for input(s): CHOL, HDL, LDLCALC, TRIG, CHOLHDL, LDLDIRECT in the last 72 hours.  No results found for: HGBA1C ------------------------------------------------------------------------------------------------------------------ No results for input(s): TSH, T4TOTAL, T3FREE, THYROIDAB in the last 72 hours.  Invalid input(s): FREET3 ------------------------------------------------------------------------------------------------------------------ No results for input(s): VITAMINB12, FOLATE, FERRITIN, TIBC, IRON, RETICCTPCT in the last 72 hours.  Coagulation profile No results for input(s): INR, PROTIME in the last 168 hours.  No results for input(s): DDIMER in the last 72 hours.  Cardiac Enzymes  Recent Labs Lab 11/23/16 1700 11/23/16 2236 11/24/16 0407  TROPONINI 0.03* 0.03* 0.03*    ------------------------------------------------------------------------------------------------------------------    Component Value Date/Time   BNP 1,680.4 (H) 11/23/2016 1139    Inpatient Medications  Scheduled Meds: . acidophilus   Oral Daily  . amLODipine  10 mg Oral Daily  . folic acid  1 mg Oral Daily  . ipratropium-albuterol  3 mL Nebulization TID  . levothyroxine  150 mcg Oral QAC breakfast  . mouth rinse  15 mL Mouth Rinse BID  . mirabegron ER  50 mg Oral Daily  . omega-3 acid ethyl esters  1 g Oral Daily   Continuous Infusions: PRN Meds:.albuterol, polyvinyl alcohol, technetium TC 26M diethylenetriame-pentaacetic acid  Micro Results Recent Results (from the past 240 hour(s))  MRSA PCR Screening     Status: None   Collection Time: 11/23/16  4:59 PM  Result Value Ref Range Status   MRSA by PCR NEGATIVE NEGATIVE Final    Comment:        The GeneXpert MRSA Assay (FDA approved for NASAL specimens only), is one component of a comprehensive MRSA colonization surveillance program. It is not intended to diagnose MRSA infection nor to guide or monitor treatment for MRSA infections.   Culture, blood (routine x 2) Call MD if unable to obtain prior to antibiotics being given     Status: None (Preliminary result)   Collection Time: 11/23/16  5:00 PM  Result Value Ref Range Status   Specimen Description BLOOD LEFT ARM  Final   Special Requests BOTTLES DRAWN AEROBIC ONLY 6CC  Final   Culture PENDING  Incomplete   Report Status PENDING  Incomplete  Culture, blood (routine x 2) Call MD if unable to obtain prior to antibiotics being given     Status: None (Preliminary result)   Collection Time: 11/23/16  5:10 PM  Result Value Ref Range Status   Specimen Description BLOOD LEFT HAND  Final   Special Requests BOTTLES DRAWN AEROBIC ONLY  [place]  Final   Culture PENDING  Incomplete   Report Status PENDING  Incomplete    Radiology Reports Dg Chest 2 View  Result Date:  11/23/2016 CLINICAL DATA:  Short of breath and cough. History of pulmonary fibrosis. EXAM: CHEST  2 VIEW COMPARISON:  07/16/2016 FINDINGS: Prior median sternotomy. Midline trachea. Moderate cardiomegaly. Right superior mediastinal soft tissue fullness is present back to 06/03/2014 and is likely attributed to tortuous aorta and possibly mild adenopathy. Bilateral pleural thickening or trace pleural fluid. No pneumothorax. Severe interstitial lung disease is lower lobe and peripheral predominant. Given this underlying chronic finding, there is no convincing evidence of superimposed acute pneumonia or overt congestive failure. Apparent increase in interstitial prominence is felt to be due to AP portable technique and resultant decreased inspiratory effort. IMPRESSION: Interstitial lung disease, consistent with the history of pulmonary fibrosis. No convincing evidence of acute superimposed process. Cardiomegaly.  No overt congestive failure. Electronically Signed   By: Marylyn Ishihara  Jobe Igo M.D.   On: 11/23/2016 12:14   Nm Pulmonary Perf And Vent  Result Date: 11/24/2016 CLINICAL DATA:  Chronic respiratory failure through severe hypoxia. Congestive heart failure. EXAM: NUCLEAR MEDICINE VENTILATION - PERFUSION LUNG SCAN TECHNIQUE: Ventilation images were obtained in multiple projections using inhaled aerosol Tc-47m DTPA. Perfusion images were obtained in multiple projections after intravenous injection of Tc-10m MAA. RADIOPHARMACEUTICALS:  31.3 mCi Technetium-4m DTPA aerosol inhalation and 4.2 mCi Technetium-95m MAA IV COMPARISON:  Focal full FINDINGS: Ventilation: No significant focal ventilation defect. Perfusion: No wedge shaped peripheral perfusion defects to suggest acute pulmonary embolism. IMPRESSION: No evidence suggestive recent pulmonary embolism. Electronically Signed   By: Lorriane Shire M.D.   On: 11/24/2016 10:11     Warnell Rasnic M.D on 11/24/2016 at 1:16 PM  Between 7am to 7pm - Pager -  [phone]  After 7pm go to [email or web address] - password [facility]  [facility] -  Office  [phone]

## 2016-11-25 DIAGNOSIS — I5023 Acute on chronic systolic (congestive) heart failure: Secondary | ICD-10-CM

## 2016-11-25 DIAGNOSIS — D696 Thrombocytopenia, unspecified: Secondary | ICD-10-CM

## 2016-11-25 DIAGNOSIS — I10 Essential (primary) hypertension: Secondary | ICD-10-CM

## 2016-11-25 DIAGNOSIS — I5022 Chronic systolic (congestive) heart failure: Secondary | ICD-10-CM

## 2016-11-25 LAB — BASIC METABOLIC PANEL
Anion gap: 8 (ref 5–15)
BUN: 16 mg/dL (ref 6–20)
CO2: 28 mmol/L (ref 22–32)
CREATININE: 1.25 mg/dL — AB (ref 0.61–1.24)
Calcium: 9 mg/dL (ref 8.9–10.3)
Chloride: 102 mmol/L (ref 101–111)
GFR calc Af Amer: 59 mL/min — ABNORMAL LOW (ref >60)
GFR, EST NON AFRICAN AMERICAN: 51 mL/min — AB (ref >60)
Glucose, Bld: 90 mg/dL (ref 65–99)
POTASSIUM: 4 mmol/L (ref 3.5–5.1)
SODIUM: 138 mmol/L (ref 135–145)

## 2016-11-25 LAB — CBC
HCT: 43.5 % (ref 39.0–52.0)
HEMOGLOBIN: 14.1 g/dL (ref 13.0–17.0)
MCH: 33.4 pg (ref 26.0–34.0)
MCHC: 32.4 g/dL (ref 30.0–36.0)
MCV: 103.1 fL — ABNORMAL HIGH (ref 78.0–100.0)
PLATELETS: 76 10*3/uL — AB (ref 150–400)
RBC: 4.22 MIL/uL (ref 4.22–5.81)
RDW: 14.6 % (ref 11.5–15.5)
WBC: 7.8 10*3/uL (ref 4.0–10.5)

## 2016-11-25 MED ORDER — FUROSEMIDE 40 MG PO TABS
40.0000 mg | ORAL_TABLET | Freq: Every day | ORAL | Status: DC
Start: 2016-11-25 — End: 2016-12-01
  Administered 2016-11-25 – 2016-12-01 (×7): 40 mg via ORAL
  Filled 2016-11-25 (×7): qty 1

## 2016-11-25 NOTE — Consult Note (Addendum)
Cardiology Consult    Patient ID: Chris Stokes MRN: [id number], DOB/AGE: [birth year]   Admit date: 11/23/2016 Date of Consult: 11/25/2016  Primary Physician: [name], MD Reason for Consult: CHF Primary Cardiologist: Dr. Ellyn Hack Requesting Provider: Dr. Waldron Labs   History of Present Illness    Chris Stokes is a 82 y.o. male with past medical history of CAD (s/p CABG in 2004 with LIMA-LAD, SVG-RI, SVG-rPDA-rPL), chronic combined systolic and diastolic CHF (EF 123XX123 by echo in 08/2016), ischemic cardiomyopathy, and pulmonary fibrosis (on home O2) who presented to [facility] ED on 11/23/2016 for acute hypoxic respiratory failure.  He was recently diagnosed with PNA on 11/18/2016. By review of notes, his baseline O2 saturation is at 85% on room air. Upon arrival to the ED, saturations were at 78%. Labs showed a WBC of 6.0, Hgb 15.0, and platelets 77K. K+ 4.4. Creatinine 1.40 (was 1.07 three weeks prior). Lactic Acid 2.6. BNP 1680. Cyclic troponin values have been flat at 0.03. CXR showed interstitial lung disease, consistent with the history of pulmonary fibrosis but no evidence of acute superimposed processes. VQ scan was negative for PE. CT Chest shows his known interstitial lung disease, mild diffuse bronchial wall thickening with mild to moderate centrilobular and paraseptal emphysema, aortic atherosclerosis in addition to left main and 3 vessel coronary artery disease, and aneurysmal dilatation of the ascending thoracic aorta (4.9 cm in diameter - semi-annual imaging recommended).  Pulmonology was consulted today as he remains on 5L [place] and is still experiencing significant dyspnea with minimal exertion. It is thought his worsening dyspnea may be related to progressive pulmonary fibrosis but with elevated BNP, further diuresis was recommended.    He has been on PO Lasix 20mg  daily since admission with this being increased to 40mg  daily earlier today. He reports good urine output and is  overall -2.2L since admission. Weight down 4 lbs. Has not noticed improvement in his breathing with diuresis, as he still experiences labored breathing with even sitting up in bed. He denies any chest discomfort or palpitations.   He was last seen by Dr. Ellyn Hack on 10/25/2016 and reported significant exertional dyspnea at that time, being on 2-3 [place]. His last cardiac catheterization was in 08/2016 and showed severe pulmonary HTN, extensive CAD with a new 75% LM lesion with patent LIMA-LAD, Seq SVG-rPDA-rPL with the initial limb to the PDA being widely patent and the distal limb 90% stenosed. SVG-RI also occluded. The new lesion is not thought to be approachable from a PCI standpoint and medical management is recommended. He has been continued on Coreg, ASA, Amlodipine, and Losartan. Imdur has not been added to his regimen as he had denied any chest discomfort. He was started on Spironolactone at this last office visit due to his worsening EF.    Past Medical History   Past Medical History:  Diagnosis Date  . Cataract 1999   bilateral cataracts   . Chronic kidney disease    multiple kidney stone flare ups  . Colon polyps    adenomatous  . Coronary artery disease, occlusive    Dr. Mare Ferrari; Cath 02/2003 for Inf STEMI -> dLM CAD with moderate to severe LAD, RI & Cx stenoses, subtotal RCA.--> CABG x 4: LIMA-LAD, SVG-RI, SVG-rPDA-rPL  . GERD (gastroesophageal reflux disease) 2012  . Hiatal hernia   . Hyperlipidemia   . Hypertension   . Hypothyroidism   . Pulmonary fibrosis ([place]) 12/08/2012   on Home O2 (asbestos exposure); Dr. Halford Chessman  . Rectal fissure   .  Seasonal allergies   . ST elevation myocardial infarction (STEMI) of inferior wall (HCC) 1984   Inferior MI  . Subsequent ST elevation (STEMI) myocardial infarction of inferior wall 02/2003   Subtotal RCA occlusion -- MV CAD on Cath --> CABG x 4  . Ulcerative colitis    Dr Henrene Pastor    Past Surgical History:  Procedure Laterality Date  .  ABDOMINAL AORTIC ANEURYSM REPAIR  1992  . CARDIAC CATHETERIZATION  02/2003   Inferior STEMI: dLM 60-70%; LAD heavy Ca2+ - mLAD 70% after D2, small pD1 60-70%, Ostial sm-mod D2 60-70%, Mod pRI 60%, Mod pCx 60%; Large/Dominant RCA - ectatic & severely diseased throughout with ectasia/stenoses & thrombus sub-totalled pre-PDA &rPL. --> Referred for CABG  . CARDIAC CATHETERIZATION N/A 09/21/2016   Procedure: Right/Left Heart Cath and Coronary/Graft Angiography;  Surgeon: Leonie Man, MD;  Location: [place] [facility];  Service: Cardiovascular;  Laterality: N/A;  . COLONOSCOPY    . CORONARY ARTERY BYPASS GRAFT  2004   4 vessel bypass: LIMA-LAD, SVG-RI, SVG-rPDA-rPL; unable to bypass the small Cx-OM vessels  . NM MYOVIEW LTD  09/2012   There is evidence of a small/moderate scar affecting the inferior wall in the inferolateral wall. There may be slight peri-infarct ischemia. LV Ejection Fraction: 53%.  There is mild hypokinesis of the septum and the apex.  [name] NM MYOVIEW LTD  08/31/2016   Severely reduced LVEF of 28%. Akinesis of the apical lateral and entire inferior-inferolateral wall - consistent with large size severe intensity fixed defect indicative the entire inferior and inferolateral walls as well as apical lateral walls. No evidence of ischemia. Read as HIGH RISK because of low EF.  [name] POLYPECTOMY    . TRANSTHORACIC ECHOCARDIOGRAM  03/2005   Normal LV function with no regional wall motion abnormality.. GR 1 DD. Mild aortic sclerosis without stenosis.  . TRANSTHORACIC ECHOCARDIOGRAM  09/09/2016   Severely reduced LV function with EF 25-30%. Diffuse hypokinesis. GR 1 DD. Diastolic flattening of the septum. Mildly dilated aortic root. Moderate RV dilation with moderately reduced function. PA pressure roughly 50 mmHg.     Allergies  Allergies  Allergen Reactions  . Lipitor [Atorvastatin] Other (See Comments)    CRAMPS  . Ramipril Cough  . Simvastatin Other (See Comments)    MYALGIA  .  Sulfonamide Derivatives Rash    Inpatient Medications    . acidophilus   Oral Daily  . amLODipine  10 mg Oral Daily  . carvedilol  12.5 mg Oral BID WC  . donepezil  10 mg Oral QHS  . folic acid  1 mg Oral Daily  . furosemide  40 mg Oral Daily  . ipratropium-albuterol  3 mL Nebulization TID  . levothyroxine  150 mcg Oral QAC breakfast  . losartan  100 mg Oral Daily  . mouth rinse  15 mL Mouth Rinse BID  . mirabegron ER  50 mg Oral Daily  . omega-3 acid ethyl esters  1 g Oral Daily  . spironolactone  12.5 mg Oral Daily    Family History    Family History  Problem Relation Age of Onset  . Arthritis/Rheumatoid Mother   . Hypertension Father   . Emphysema Father   . Hyperlipidemia Father   . Colon cancer Cousin     mat side  . Lymphoma Sister   . Hypertension Other   . Emphysema Brother   . Hyperlipidemia Sister   . Hyperlipidemia Brother     Social History    Social  History   Social History  . Marital status: Married    Spouse name: N/A  . Number of children: 2  . Years of education: N/A   Occupational History  . retired   .     Social History Main Topics  . Smoking status: Former Smoker    Packs/day: 2.00    Years: 25.00    Types: Cigarettes    Quit date: 11/29/1966  . Smokeless tobacco: Never Used  . Alcohol use No  . Drug use: No  . Sexual activity: No   Other Topics Concern  . Not on file   Social History Narrative  . No narrative on file     Review of Systems    General:  No chills, fever, night sweats or weight changes.  Cardiovascular:  No chest pain, palpitations, or paroxysmal nocturnal dyspnea. Positive for dyspnea on exertion, edema, and orthopnea. Dermatological: No rash, lesions/masses Respiratory: No cough, Positive for dyspnea. Urologic: No hematuria, dysuria Abdominal:   No nausea, vomiting, diarrhea, bright red blood per rectum, melena, or hematemesis Neurologic:  No visual changes, wkns, changes in mental status. All other  systems reviewed and are otherwise negative except as noted above.  Physical Exam    Blood pressure 136/89, pulse 68, temperature 97.3 F (36.3 C), temperature source Oral, resp. rate 18, height 5\' 10"  (1.778 m), weight 178 lb 1.6 oz (80.8 kg), SpO2 (!) 86 %.  General: Pleasant, elderly Caucasian male appearing in NAD. Psych: Normal affect. Neuro: Alert and oriented X 3. Moves all extremities spontaneously. HEENT: Normal  Neck: Supple without bruits. JVD at 8cm. Lungs:  Resp regular and unlabored, mild rhonchi in upper lung fields. Heart: RRR, no s3, s4, 2/6 SEM at Apex Abdomen: Soft, non-tender, non-distended, BS + x 4.  Extremities: No clubbing, cyanosis or edema. DP/PT/Radials 2+ and equal bilaterally.  Labs    Troponin (Point of Care Test) No results for input(s): TROPIPOC in the last 72 hours.  Recent Labs  11/23/16 1140 11/23/16 1700 11/23/16 2236 11/24/16 0407  TROPONINI 0.03* 0.03* 0.03* 0.03*   Lab Results  Component Value Date   WBC 7.8 11/25/2016   HGB 14.1 11/25/2016   HCT 43.5 11/25/2016   MCV 103.1 (H) 11/25/2016   PLT 76 (L) 11/25/2016    Recent Labs Lab 11/25/16 0447  NA 138  K 4.0  CL 102  CO2 28  BUN 16  CREATININE 1.25*  CALCIUM 9.0  GLUCOSE 90   Lab Results  Component Value Date   CHOL 211 (H) 07/30/2016   HDL 38 (L) 07/30/2016   LDLCALC 156 (H) 07/30/2016   TRIG 85 07/30/2016   No results found for: [facility]   Radiology Studies    Dg Chest 2 View  Result Date: 11/23/2016 CLINICAL DATA:  Short of breath and cough. History of pulmonary fibrosis. EXAM: CHEST  2 VIEW COMPARISON:  07/16/2016 FINDINGS: Prior median sternotomy. Midline trachea. Moderate cardiomegaly. Right superior mediastinal soft tissue fullness is present back to 06/03/2014 and is likely attributed to tortuous aorta and possibly mild adenopathy. Bilateral pleural thickening or trace pleural fluid. No pneumothorax. Severe interstitial lung disease is lower lobe and  peripheral predominant. Given this underlying chronic finding, there is no convincing evidence of superimposed acute pneumonia or overt congestive failure. Apparent increase in interstitial prominence is felt to be due to AP portable technique and resultant decreased inspiratory effort. IMPRESSION: Interstitial lung disease, consistent with the history of pulmonary fibrosis. No convincing evidence of acute superimposed process.  Cardiomegaly.  No overt congestive failure. Electronically Signed   By: Abigail Miyamoto M.D.   On: 11/23/2016 12:14   Ct Chest High Resolution  Result Date: 11/24/2016 CLINICAL DATA:  82 year old male with history of idiopathic pulmonary fibrosis. Follow-up study. EXAM: CT CHEST WITHOUT CONTRAST TECHNIQUE: Multidetector CT imaging of the chest was performed following the standard protocol without intravenous contrast. High resolution imaging of the lungs, as well as inspiratory and expiratory imaging, was performed. COMPARISON:  Chest CT 11/10/2012. FINDINGS: Cardiovascular: Heart size is enlarged with severe right atrial dilatation. There is no significant pericardial fluid, thickening or pericardial calcification. There is aortic atherosclerosis, as well as atherosclerosis of the great vessels of the mediastinum and the coronary arteries, including calcified atherosclerotic plaque in the left main, left anterior descending, left circumflex and right coronary arteries. Status post median sternotomy for CABG, including LIMA to the LAD. Calcifications of the aortic valve. Aneurysmal dilatation of ascending thoracic aorta which measures up to 4.9 cm in diameter. Ectasia of the aortic arch which measures up to 4.1 cm in diameter distally. Mediastinum/Nodes: Multiple borderline enlarged and enlarged mediastinal and bilateral hilar lymph nodes measuring up to 2 cm in short axis in the low right paratracheal nodal station. Esophagus is unremarkable in appearance. No axillary lymphadenopathy.  Lungs/Pleura: High-resolution images demonstrate widespread subpleural reticulation, parenchymal banding, traction bronchiectasis and honeycombing, all of which demonstrates a craniocaudal gradient. These findings have progressed compared to prior study from 11/10/2012, and are considered diagnostic from an imaging standpoint of usual interstitial pneumonia (UIP). Inspiratory and expiratory imaging is unremarkable. Mild diffuse bronchial wall thickening with mild to moderate centrilobular and paraseptal emphysema noted predominantly in the lung apices. Small bilateral pleural effusions lying dependently. No acute consolidative airspace disease. Upper Abdomen: Aortic atherosclerosis. Incompletely visualized low-attenuation lesion measuring at least 3.3 cm in the upper pole of left kidney, slightly larger than remote prior study from 2013, presumably a cyst. Multiple small calcified granulomas are noted in the liver. Musculoskeletal: Median sternotomy wires. There are no aggressive appearing lytic or blastic lesions noted in the visualized portions of the skeleton. IMPRESSION: 1. The appearance of the lungs is compatible with interstitial lung disease, and findings are considered diagnostic of usual interstitial pneumonia (UIP). 2. There is also mild diffuse bronchial wall thickening with mild to moderate centrilobular and paraseptal emphysema noted predominantly in the lung apices. 3. Cardiomegaly with severe right atrial dilatation. 4. Aortic atherosclerosis, in addition to left main and 3 vessel coronary artery disease. Status post median sternotomy for CABG, including LIMA to the LAD. 5. There are calcifications of the aortic valve. Echocardiographic correlation for evaluation of potential valvular dysfunction may be warranted if clinically indicated. 6. Aneurysmal dilatation of the ascending thoracic aorta (4.9 cm in diameter). Ascending thoracic aortic aneurysm. Recommend semi-annual imaging followup by CTA or  MRA and referral to cardiothoracic surgery if not already obtained. This recommendation follows 2010 ACCF/AHA/AATS/ACR/ASA/SCA/SCAI/SIR/STS/SVM Guidelines for the Diagnosis and Management of Patients With Thoracic Aortic Disease. Circulation. 2010; 121: [id number]. 7. Additional incidental findings, as above. Electronically Signed   By: Vinnie Langton M.D.   On: 11/24/2016 16:13   Nm Pulmonary Perf And Vent  Result Date: 11/24/2016 CLINICAL DATA:  Chronic respiratory failure through severe hypoxia. Congestive heart failure. EXAM: NUCLEAR MEDICINE VENTILATION - PERFUSION LUNG SCAN TECHNIQUE: Ventilation images were obtained in multiple projections using inhaled aerosol Tc-77m DTPA. Perfusion images were obtained in multiple projections after intravenous injection of Tc-50m MAA. RADIOPHARMACEUTICALS:  31.3 mCi Technetium-23m DTPA aerosol  inhalation and 4.2 mCi Technetium-82m MAA IV COMPARISON:  Focal full FINDINGS: Ventilation: No significant focal ventilation defect. Perfusion: No wedge shaped peripheral perfusion defects to suggest acute pulmonary embolism. IMPRESSION: No evidence suggestive recent pulmonary embolism. Electronically Signed   By: Lorriane Shire M.D.   On: 11/24/2016 10:11    EKG & Cardiac Imaging    EKG: Sinus rhythm with 1st degree AV Block, HR 73. TWI in anterior leads appears improved when compared to prior tracings.   Echocardiogram: 09/09/2016 Study Conclusions  - Left ventricle: The cavity size was normal. Systolic function was   severely reduced. The estimated ejection fraction was in the   range of 25% to 30%. Diffuse hypokinesis. Doppler parameters are   consistent with abnormal left ventricular relaxation (grade 1   diastolic dysfunction). - Ventricular septum: The contour showed diastolic flattening. - Aortic valve: There was trivial regurgitation. - Aorta: Aortic root dimension: 38 mm (ED). - Ascending aorta: The ascending aorta was mildly dilated. - Mitral  valve: There was mild regurgitation. - Right ventricle: The cavity size was moderately dilated. Wall   thickness was normal. Systolic function was moderately reduced. - Right atrium: The atrium was moderately dilated. - Tricuspid valve: There was mild regurgitation. - Pulmonary arteries: Systolic pressure was moderately increased.   PA peak pressure: 50 mm Hg (S).  Impressions:  - Compared to the prior study, there has been no significant   interval change.  Cardiac Catheterization: 09/21/2016  Hemodynamic findings consistent with severe pulmonary hypertension - mixed with moderate we elevated LVEDP in setting of severe systemic hypertension  There is severe left ventricular systolic dysfunction - reported roughly 30-35% by echo with inferior hypokinesis.  LV end diastolic pressure is moderately elevated.  Ost RPDA to RPDA lesion, 100 %stenosed.  Ost RCA to Dist RCA lesion, 100 %stenosed. Dist RCA lesion, 90 %stenosed.  Seq SVG-rPDA-rPL and is normal in caliber. The initial limb to the PDA is widely patent, however Distal limb of SVG-rPDA-rPL Graft to rPL Insertion lesion, 90 %stenosed. The upstream PL also has disease.  Post Atrio lesion, 50 %stenosed. 2nd RPLB lesion, 80 %stenosed.  RPDA lesion, 90 %stenosed distal to the anastomosis of the vein graft. The distal vessels fill via collaterals  Ost LM to LM lesion, 75 %stenosed - new lesion. LM lesion, 65 %stenosed - original lesion.  Prox LAD lesion, 90 %stenosed. Mid LAD lesion, 100 %stenosed. distal LAD fills via LIMA  LIMA-LAD is normal in caliber and anatomically normal. Distal LAD has mild diffuse disease and is a wraparound LAD.  SVG-RI: Origin to Prox Graft lesion, 100 %stenosed.  Ramus lesion, 70 %stenosed. Minimal disease past the lesion  Ost 1st Mrg to 1st Mrg lesion, 70 %stenosed.    Severe native coronary disease with now progression of left main disease to have an ostial lesion. The circumflex and ramus  intermedius are in jeopardy based on the left main lesions and the LIMA-LAD is patent. The entire RCA is occluded. The vein graft to the PDA-PL has severe anastomotic disease in the PL fill be a very difficult to reach branch point. Additionally the PL beyond the graft has moderate to min severe disease in the major branches. Minimal distal runoff.  Potential PCI options would be distal vein graft anastomoses into the PL which would be a very difficult procedure with low likelihood of long-term success. Other option would be rotational atherectomy of the left main into the ramus with provisional stenting of the ostial circumflex and the  proximal OM1.  Given his underlying lung disease with severe pulmonary hypertension, medical management is probably the best option.  I will review with interventional colleagues, however I doubt there is much to offer from an interventional standpoint  Plan:  Return to short sale the area for sheath removal and blood pressure control.  Continue aggressive cardiac myopathy management: We will increase amlodipine dose to 10 mg based on his systemic hypertension.  Can further address diuretics and afterload reduction in the outpatient setting.  He will follow me in 1-2 weeks to discuss findings and recommendations.   Assessment & Plan    1. Acute on Chronic Combined Systolic and Diastolic CHF - diagnosed with PNA on 11/18/2016 and noted to have acute hypoxic respiratory failure with saturations at 78% on admission (baseline O2 saturation is at 85% on room air). BNP elevated to 1680 on admission with CXR showing interstitial lung disease, consistent with the history of pulmonary fibrosis but no evidence of acute superimposed processes. VQ scan was negative for PE.  - EF 25-30% by echo in 08/2016. Was started on PO Lasix 20mg  daily on admission with this being increased to 40mg  daily earlier today. He reports good urine output and is overall -2.2L since  admission. Weight down 4 lbs.  - while he is responding to Lasix, he reports no improvement in his respiratory status with diuresis and he does not appear volume overloaded on physical exam. Will repeat BNP in AM. Continue PO Lasix for now. Monitor kidney function closely as we do not want to overdiurese him.   2. CAD - s/p CABG in 2004 with LIMA-LAD, SVG-RI, SVG-rPDA-rPL. Cath in 08/2016 showed extensive CAD with a new 75% LM lesion with patent LIMA-LAD, Seq SVG-rPDA-rPL with the initial limb to the PDA being widely patent and the distal limb 90% stenosed, and SVG-RI occluded. PCI not performed and medical management recommended.  - continue ASA, Amlodipine, BB and Losartan. Intolerant to statin therapy.   3. HTN - BP at 131/89 - 156/104 in the past 24 hours.  - continue Amlodipine, Coreg, Lasix, and Losartan. Can consider increasing Coreg dosing or starting Imdur if BP remains elevated.  4. Pulmonary Fibrosis - Pulmonology following.   5. AKI - creatinine 1.40 on admission. Improved to 1.25 this AM.   6. Ascending Thoracic Aorta Aneurysm - 4.9 cm in diameter by CT this admission. Semi-annual imaging recommended, however with his age and co-morbidities, he would not be a good surgical candidate.    Signed, Erma Heritage, PA-C 11/25/2016, 12:41 PM Pager: [phone] The patient has been seen in conjunction with Bernerd Pho, PA-C. All aspects of care have been considered and discussed. The patient has been personally interviewed, examined, and all clinical data has been reviewed.   The patient has end-stage idiopathic pulmonary fibrosis, chronic oxygen requirement, coronary artery disease with prior CABG, essential hypertension, right ventricular dysfunction secondary to pulmonary hypertension, severe left ventricular systolic dysfunction (estimated EF 15-20% October 2017).  Rales of pulmonary fibrosis audible. Moderate JVD is noted. BNP 1680. Personally reviewed the echo from  October 2017 and was impressed by severe global left ventricular hypokinesis.  Overall, the mix of pulmonary fibrosis with hypoxia, severe pulmonary hypertension, and severe left ventricular systolic dysfunction are very very poor prognostic indicators.  Recommend diuresis as tolerated by kidney function and blood pressure. Hospice and Palliative care should be considered.  Overall prognosis is extremely poor.

## 2016-11-25 NOTE — Consult Note (Signed)
Name: Chris Stokes MRN: [id number] DOB: Jun 10, 1929    ADMISSION DATE:  11/23/2016 CONSULTATION DATE:  11/24/2016  REFERRING MD :  [place], [facility]  CHIEF COMPLAINT:  Worsening hypoxia  HISTORY OF PRESENT ILLNESS:   82 yo former smoker with progressive dyspnea and hypoxia after outpt tx for pneumonia.  He has hx of IPF (intolerant of esbriet/ofev due to diarrhea), chronic systolic CHF, ulcerative colitis, CVA.  SIGNIFICANT EVENTS  12/27 Admit  STUDIES:  12/27 VQ scan >> negative 12/27 HRCT >> UIP pattern. Emphysema in apices. Aneurysmal dilatation of the ascending thoracic aorta (4.9 cm in diameter).   SUBJECTIVE:   VITAL SIGNS: Temp:  [97.5 F (36.4 C)-97.9 F (36.6 C)] 97.9 F (36.6 C) (12/28 0705) Pulse Rate:  [62-74] 62 (12/28 0647) Resp:  [20] 20 (12/27 2212) BP: (131-156)/(87-104) 156/104 (12/28 0705) SpO2:  [87 %-92 %] 89 % (12/28 0810) Weight:  [80.8 kg (178 lb 1.6 oz)] 80.8 kg (178 lb 1.6 oz) (12/28 0705)  PHYSICAL EXAMINATION: Gen. Pleasant, well-nourished, in no distress, normal affect ENT - no lesions, no post nasal drip Neck: No JVD, no thyromegaly, no carotid bruits Lungs: no use of accessory muscles,  bibasal fine rales. 5L Russell up from baseline 2. Cardiovascular: Rhythm regular, heart sounds  normal, no murmurs or gallops, no peripheral edema Abdomen: soft and non-tender, no hepatosplenomegaly, BS normal. Musculoskeletal: No deformities, no cyanosis or clubbing Neuro:  alert, non focal    Recent Labs Lab 11/23/16 1140 11/23/16 1700 11/24/16 0407 11/25/16 0447  NA 141  --  139 138  K 4.4  --  4.0 4.0  CL 107  --  104 102  CO2 26  --  29 28  BUN 18  --  17 16  CREATININE 1.40* 1.20 1.21 1.25*  GLUCOSE 124*  --  130* 90    Recent Labs Lab 11/23/16 1700 11/24/16 0407 11/25/16 0447  HGB 16.5 14.7 14.1  HCT 50.4 44.9 43.5  WBC 6.8 5.2 7.8  PLT 81* 71* 76*   Dg Chest 2 View  Result Date: 11/23/2016 CLINICAL DATA:  Short of breath and  cough. History of pulmonary fibrosis. EXAM: CHEST  2 VIEW COMPARISON:  07/16/2016 FINDINGS: Prior median sternotomy. Midline trachea. Moderate cardiomegaly. Right superior mediastinal soft tissue fullness is present back to 06/03/2014 and is likely attributed to tortuous aorta and possibly mild adenopathy. Bilateral pleural thickening or trace pleural fluid. No pneumothorax. Severe interstitial lung disease is lower lobe and peripheral predominant. Given this underlying chronic finding, there is no convincing evidence of superimposed acute pneumonia or overt congestive failure. Apparent increase in interstitial prominence is felt to be due to AP portable technique and resultant decreased inspiratory effort. IMPRESSION: Interstitial lung disease, consistent with the history of pulmonary fibrosis. No convincing evidence of acute superimposed process. Cardiomegaly.  No overt congestive failure. Electronically Signed   By: Abigail Miyamoto M.D.   On: 11/23/2016 12:14   Ct Chest High Resolution  Result Date: 11/24/2016 CLINICAL DATA:  82 year old male with history of idiopathic pulmonary fibrosis. Follow-up study. EXAM: CT CHEST WITHOUT CONTRAST TECHNIQUE: Multidetector CT imaging of the chest was performed following the standard protocol without intravenous contrast. High resolution imaging of the lungs, as well as inspiratory and expiratory imaging, was performed. COMPARISON:  Chest CT 11/10/2012. FINDINGS: Cardiovascular: Heart size is enlarged with severe right atrial dilatation. There is no significant pericardial fluid, thickening or pericardial calcification. There is aortic atherosclerosis, as well as atherosclerosis of the great vessels of the  mediastinum and the coronary arteries, including calcified atherosclerotic plaque in the left main, left anterior descending, left circumflex and right coronary arteries. Status post median sternotomy for CABG, including LIMA to the LAD. Calcifications of the aortic  valve. Aneurysmal dilatation of ascending thoracic aorta which measures up to 4.9 cm in diameter. Ectasia of the aortic arch which measures up to 4.1 cm in diameter distally. Mediastinum/Nodes: Multiple borderline enlarged and enlarged mediastinal and bilateral hilar lymph nodes measuring up to 2 cm in short axis in the low right paratracheal nodal station. Esophagus is unremarkable in appearance. No axillary lymphadenopathy. Lungs/Pleura: High-resolution images demonstrate widespread subpleural reticulation, parenchymal banding, traction bronchiectasis and honeycombing, all of which demonstrates a craniocaudal gradient. These findings have progressed compared to prior study from 11/10/2012, and are considered diagnostic from an imaging standpoint of usual interstitial pneumonia (UIP). Inspiratory and expiratory imaging is unremarkable. Mild diffuse bronchial wall thickening with mild to moderate centrilobular and paraseptal emphysema noted predominantly in the lung apices. Small bilateral pleural effusions lying dependently. No acute consolidative airspace disease. Upper Abdomen: Aortic atherosclerosis. Incompletely visualized low-attenuation lesion measuring at least 3.3 cm in the upper pole of left kidney, slightly larger than remote prior study from 2013, presumably a cyst. Multiple small calcified granulomas are noted in the liver. Musculoskeletal: Median sternotomy wires. There are no aggressive appearing lytic or blastic lesions noted in the visualized portions of the skeleton. IMPRESSION: 1. The appearance of the lungs is compatible with interstitial lung disease, and findings are considered diagnostic of usual interstitial pneumonia (UIP). 2. There is also mild diffuse bronchial wall thickening with mild to moderate centrilobular and paraseptal emphysema noted predominantly in the lung apices. 3. Cardiomegaly with severe right atrial dilatation. 4. Aortic atherosclerosis, in addition to left main and 3  vessel coronary artery disease. Status post median sternotomy for CABG, including LIMA to the LAD. 5. There are calcifications of the aortic valve. Echocardiographic correlation for evaluation of potential valvular dysfunction may be warranted if clinically indicated. 6. Aneurysmal dilatation of the ascending thoracic aorta (4.9 cm in diameter). Ascending thoracic aortic aneurysm. Recommend semi-annual imaging followup by CTA or MRA and referral to cardiothoracic surgery if not already obtained. This recommendation follows 2010 ACCF/AHA/AATS/ACR/ASA/SCA/SCAI/SIR/STS/SVM Guidelines for the Diagnosis and Management of Patients With Thoracic Aortic Disease. Circulation. 2010; 121: [id number]. 7. Additional incidental findings, as above. Electronically Signed   By: Vinnie Langton M.D.   On: 11/24/2016 16:13   Nm Pulmonary Perf And Vent  Result Date: 11/24/2016 CLINICAL DATA:  Chronic respiratory failure through severe hypoxia. Congestive heart failure. EXAM: NUCLEAR MEDICINE VENTILATION - PERFUSION LUNG SCAN TECHNIQUE: Ventilation images were obtained in multiple projections using inhaled aerosol Tc-69m DTPA. Perfusion images were obtained in multiple projections after intravenous injection of Tc-82m MAA. RADIOPHARMACEUTICALS:  31.3 mCi Technetium-59m DTPA aerosol inhalation and 4.2 mCi Technetium-32m MAA IV COMPARISON:  Focal full FINDINGS: Ventilation: No significant focal ventilation defect. Perfusion: No wedge shaped peripheral perfusion defects to suggest acute pulmonary embolism. IMPRESSION: No evidence suggestive recent pulmonary embolism. Electronically Signed   By: Lorriane Shire M.D.   On: 11/24/2016 10:11    ASSESSMENT / PLAN:  Acute on chronic hypoxic respiratory failure in setting of IPF with ischemic CM. - he has been intolerant of therapy for IPF as outpt due to diarrhea - adjust oxygen to keep SpO2 88 to 95% - negative fluid balance as tolerated - consider palliative care  assessment  Georgann Housekeeper, AGACNP-BC [facility] Pulmonology/Critical Care Pager [phone] or [phone]  11/25/2016 11:23 AM   He has progress dyspnea, and hypoxia.  His systolic CHF is likely playing some role in his symptoms, but I think the biggest contributor is the natural progression of his IPF.  I had detailed d/w him and his family and raised the issue of palliative care assessment.  They are very open to this.  D/w Dr. Waldron Labs.  Chesley Mires, MD [facility] Pulmonary/Critical Care 11/25/2016, 3:11 PM Pager:  [phone] After 3pm call: [phone]

## 2016-11-25 NOTE — Progress Notes (Signed)
PROGRESS NOTE                                                                                                                                                                                                             Patient Demographics:    Chris Stokes, is a 82 y.o. male, DOB - 1929-04-29, [id number]  Admit date - 11/23/2016   Admitting Physician Chris Mocha, MD  Outpatient Primary MD for the patient is Chris Kehr, MD  LOS - 2  Outpatient Specialists: Pulm Dr Chris Stokes, Cards Dr Chris Stokes  Chief Complaint  Patient presents with  . Pneumonia  . Shortness of Breath       Brief Narrative   82 y.o. male with a Past Medical History low thyroid, HLD, IPF, HTN who presents with SOB. Dx acute on chronic resp failure.    Subjective:    Chris Stokes today has, No headache, No chest pain, No abdominal pain -reports SOB, weakness.   Assessment  & Plan :    Principal Problem:   Acute on chronic respiratory failure with hypoxia (HCC) Active Problems:   Hypothyroidism   Hyperlipidemia with target LDL less than 70; Statin intolerant   Essential hypertension   Coronary artery disease involving native heart without angina pectoris   IPF (idiopathic pulmonary fibrosis) (HCC)   Systolic CHF (HCC)   AKI (acute kidney injury) (HCC)   Thrombocytopenia (HCC)   Pressure injury of skin  Acute on chronic hypoxic respiratory failure - VQ scan negative for PE, stopped Lovenox - No evidence of volume overload on chest x-ray or clinical exam, but significantly elevated BNP, known cardiomyopathy at baseline, Consult cardiology to see if they have any recommendation. - With known history of IPF, likely is the major contributing factor to his respiratory failure,so  pulmonary consulted.  Ischemic  cardiomyopathy with EF 25-30% - Most recent echo 08/2016 with EF 25-30% with diffuse hypokinesis and grade 1 diastolic dysfunction - continue with Coreg, Cozaar and  Aldactone, daily weight, strict ins and outs, will keep on low-dose Lasix. - Cardiology consulted to assist with management.  IPF - Continue with oxygen, PCCM consult appreciated.  Ascending aortic aneurysm 4.9cm was 4.4 on prior CT. - Doubt he is a candidate for any intervention on this,  - CTA or MRA in 6 month.  Thrombocytopenia - New onset,  will continue to monitor, patient usually baseline on the lower side 120s to 130s, no evidence of active bleed, no indication for transfusion  Hyperlipidemia - Continue statin  Hypertension - Continue with home medication  Hypothyroidism - Continue Synthroid  Code Status : DNR  Family Communication  : Wife at bedside  Disposition Plan  : Home when stable  Consults  :  PCCM, cardiology  Procedures  : None  DVT Prophylaxis  :  SCDs , no chemical prophylaxis secondary to significant thrombocytopenia  Lab Results  Component Value Date   PLT 76 (L) 11/25/2016    Antibiotics  :   Anti-infectives    Start     Dose/Rate Route Frequency Ordered Stop   11/23/16 1800  cefTRIAXone (ROCEPHIN) 1 g in dextrose 5 % 50 mL IVPB  Status:  Discontinued     1 g 100 mL/hr over 30 Minutes Intravenous Every 24 hours 11/23/16 1628 11/23/16 1705   11/23/16 1800  azithromycin (ZITHROMAX) 500 mg in dextrose 5 % 250 mL IVPB  Status:  Discontinued     500 mg 250 mL/hr over 60 Minutes Intravenous Every 24 hours 11/23/16 1628 11/23/16 1705        Objective:   Vitals:   11/25/16 0649 11/25/16 0705 11/25/16 0810 11/25/16 1146  BP:  (!) 156/104  136/89  Pulse:    68  Resp:    18  Temp:  97.9 F (36.6 C)  97.3 F (36.3 C)  TempSrc:  Oral  Oral  SpO2: (!) 87% 90% (!) 89% (!) 86%  Weight:  80.8 kg (178 lb 1.6 oz)    Height:        Wt Readings from Last 3 Encounters:  11/25/16 80.8 kg (178 lb 1.6 oz)  11/01/16 83.5 kg (184 lb)  10/25/16 83.6 kg (184 lb 6.4 oz)     Intake/Output Summary (Last 24 hours) at 11/25/16 1153 Last data filed at  11/25/16 0900  Gross per 24 hour  Intake              600 ml  Output              900 ml  Net             -300 ml     Physical Exam  Awake Alert, Oriented X 3,frail. Supple Neck,No JVD.  Symmetrical Chest wall movement, Good air movement bilaterally, Bibasilar Rales RRR,No Gallops,Rubs or new Murmurs, No Parasternal Heave +ve B.Sounds, Abd Soft, No tenderness,  No rebound - guarding or rigidity. No Cyanosis, Clubbing or edema, No new Rash or bruise     Data Review:    CBC  Recent Labs Lab 11/23/16 1140 11/23/16 1700 11/24/16 0407 11/25/16 0447  WBC 6.0 6.8 5.2 7.8  HGB 15.0 16.5 14.7 14.1  HCT 46.4 50.4 44.9 43.5  PLT 77* 81* 71* 76*  MCV 103.3* 104.8* 102.7* 103.1*  MCH 33.4 34.3* 33.6 33.4  MCHC 32.3 32.7 32.7 32.4  RDW 14.8 14.7 14.4 14.6  LYMPHSABS 0.8  --   --   --   MONOABS 0.6  --   --   --   EOSABS 0.2  --   --   --   BASOSABS 0.0  --   --   --     Chemistries   Recent Labs Lab 11/23/16 1140 11/23/16 1700 11/23/16 2236 11/24/16 0407 11/25/16 0447  NA 141  --   --  139 138  K 4.4  --   --  4.0 4.0  CL 107  --   --  104 102  CO2 26  --   --  29 28  GLUCOSE 124*  --   --  130* 90  BUN 18  --   --  17 16  CREATININE 1.40* 1.20  --  1.21 1.25*  CALCIUM 9.4  --   --  9.2 9.0  MG  --   --  1.8  --   --    ------------------------------------------------------------------------------------------------------------------ No results for input(s): CHOL, HDL, LDLCALC, TRIG, CHOLHDL, LDLDIRECT in the last 72 hours.  No results found for: HGBA1C ------------------------------------------------------------------------------------------------------------------ No results for input(s): TSH, T4TOTAL, T3FREE, THYROIDAB in the last 72 hours.  Invalid input(s): FREET3 ------------------------------------------------------------------------------------------------------------------ No results for input(s): VITAMINB12, FOLATE, FERRITIN, TIBC, IRON, RETICCTPCT in  the last 72 hours.  Coagulation profile No results for input(s): INR, PROTIME in the last 168 hours.  No results for input(s): DDIMER in the last 72 hours.  Cardiac Enzymes  Recent Labs Lab 11/23/16 1700 11/23/16 2236 11/24/16 0407  TROPONINI 0.03* 0.03* 0.03*   ------------------------------------------------------------------------------------------------------------------    Component Value Date/Time   BNP 1,680.4 (H) 11/23/2016 1139    Inpatient Medications  Scheduled Meds: . acidophilus   Oral Daily  . amLODipine  10 mg Oral Daily  . carvedilol  12.5 mg Oral BID WC  . donepezil  10 mg Oral QHS  . folic acid  1 mg Oral Daily  . furosemide  40 mg Oral Daily  . ipratropium-albuterol  3 mL Nebulization TID  . levothyroxine  150 mcg Oral QAC breakfast  . losartan  100 mg Oral Daily  . mouth rinse  15 mL Mouth Rinse BID  . mirabegron ER  50 mg Oral Daily  . omega-3 acid ethyl esters  1 g Oral Daily  . spironolactone  12.5 mg Oral Daily   Continuous Infusions: PRN Meds:.albuterol, polyvinyl alcohol, technetium TC 28M diethylenetriame-pentaacetic acid  Micro Results Recent Results (from the past 240 hour(s))  Urine culture     Status: None   Collection Time: 11/23/16  2:25 PM  Result Value Ref Range Status   Specimen Description URINE, CATHETERIZED  Final   Special Requests NONE  Final   Culture NO GROWTH  Final   Report Status 11/24/2016 FINAL  Final  MRSA PCR Screening     Status: None   Collection Time: 11/23/16  4:59 PM  Result Value Ref Range Status   MRSA by PCR NEGATIVE NEGATIVE Final    Comment:        The GeneXpert MRSA Assay (FDA approved for NASAL specimens only), is one component of a comprehensive MRSA colonization surveillance program. It is not intended to diagnose MRSA infection nor to guide or monitor treatment for MRSA infections.   Culture, blood (routine x 2) Call MD if unable to obtain prior to antibiotics being given     Status:  None (Preliminary result)   Collection Time: 11/23/16  5:00 PM  Result Value Ref Range Status   Specimen Description BLOOD LEFT ARM  Final   Special Requests BOTTLES DRAWN AEROBIC ONLY 6CC  Final   Culture NO GROWTH < 24 HOURS  Final   Report Status PENDING  Incomplete  Culture, blood (routine x 2) Call MD if unable to obtain prior to antibiotics being given     Status: None (Preliminary result)   Collection Time: 11/23/16  5:10 PM  Result Value Ref Range Status   Specimen Description BLOOD LEFT HAND  Final  Special Requests BOTTLES DRAWN AEROBIC ONLY  6CC  Final   Culture NO GROWTH < 24 HOURS  Final   Report Status PENDING  Incomplete    Radiology Reports Dg Chest 2 View  Result Date: 11/23/2016 CLINICAL DATA:  Short of breath and cough. History of pulmonary fibrosis. EXAM: CHEST  2 VIEW COMPARISON:  07/16/2016 FINDINGS: Prior median sternotomy. Midline trachea. Moderate cardiomegaly. Right superior mediastinal soft tissue fullness is present back to 06/03/2014 and is likely attributed to tortuous aorta and possibly mild adenopathy. Bilateral pleural thickening or trace pleural fluid. No pneumothorax. Severe interstitial lung disease is lower lobe and peripheral predominant. Given this underlying chronic finding, there is no convincing evidence of superimposed acute pneumonia or overt congestive failure. Apparent increase in interstitial prominence is felt to be due to AP portable technique and resultant decreased inspiratory effort. IMPRESSION: Interstitial lung disease, consistent with the history of pulmonary fibrosis. No convincing evidence of acute superimposed process. Cardiomegaly.  No overt congestive failure. Electronically Signed   By: Abigail Miyamoto M.D.   On: 11/23/2016 12:14   Ct Chest High Resolution  Result Date: 11/24/2016 CLINICAL DATA:  82 year old male with history of idiopathic pulmonary fibrosis. Follow-up study. EXAM: CT CHEST WITHOUT CONTRAST TECHNIQUE: Multidetector  CT imaging of the chest was performed following the standard protocol without intravenous contrast. High resolution imaging of the lungs, as well as inspiratory and expiratory imaging, was performed. COMPARISON:  Chest CT 11/10/2012. FINDINGS: Cardiovascular: Heart size is enlarged with severe right atrial dilatation. There is no significant pericardial fluid, thickening or pericardial calcification. There is aortic atherosclerosis, as well as atherosclerosis of the great vessels of the mediastinum and the coronary arteries, including calcified atherosclerotic plaque in the left main, left anterior descending, left circumflex and right coronary arteries. Status post median sternotomy for CABG, including LIMA to the LAD. Calcifications of the aortic valve. Aneurysmal dilatation of ascending thoracic aorta which measures up to 4.9 cm in diameter. Ectasia of the aortic arch which measures up to 4.1 cm in diameter distally. Mediastinum/Nodes: Multiple borderline enlarged and enlarged mediastinal and bilateral hilar lymph nodes measuring up to 2 cm in short axis in the low right paratracheal nodal station. Esophagus is unremarkable in appearance. No axillary lymphadenopathy. Lungs/Pleura: High-resolution images demonstrate widespread subpleural reticulation, parenchymal banding, traction bronchiectasis and honeycombing, all of which demonstrates a craniocaudal gradient. These findings have progressed compared to prior study from 11/10/2012, and are considered diagnostic from an imaging standpoint of usual interstitial pneumonia (UIP). Inspiratory and expiratory imaging is unremarkable. Mild diffuse bronchial wall thickening with mild to moderate centrilobular and paraseptal emphysema noted predominantly in the lung apices. Small bilateral pleural effusions lying dependently. No acute consolidative airspace disease. Upper Abdomen: Aortic atherosclerosis. Incompletely visualized low-attenuation lesion measuring at least  3.3 cm in the upper pole of left kidney, slightly larger than remote prior study from 2013, presumably a cyst. Multiple small calcified granulomas are noted in the liver. Musculoskeletal: Median sternotomy wires. There are no aggressive appearing lytic or blastic lesions noted in the visualized portions of the skeleton. IMPRESSION: 1. The appearance of the lungs is compatible with interstitial lung disease, and findings are considered diagnostic of usual interstitial pneumonia (UIP). 2. There is also mild diffuse bronchial wall thickening with mild to moderate centrilobular and paraseptal emphysema noted predominantly in the lung apices. 3. Cardiomegaly with severe right atrial dilatation. 4. Aortic atherosclerosis, in addition to left main and 3 vessel coronary artery disease. Status post median sternotomy for CABG, including  LIMA to the LAD. 5. There are calcifications of the aortic valve. Echocardiographic correlation for evaluation of potential valvular dysfunction may be warranted if clinically indicated. 6. Aneurysmal dilatation of the ascending thoracic aorta (4.9 cm in diameter). Ascending thoracic aortic aneurysm. Recommend semi-annual imaging followup by CTA or MRA and referral to cardiothoracic surgery if not already obtained. This recommendation follows 2010 ACCF/AHA/AATS/ACR/ASA/SCA/SCAI/SIR/STS/SVM Guidelines for the Diagnosis and Management of Patients With Thoracic Aortic Disease. Circulation. 2010; 121: [id number]. 7. Additional incidental findings, as above. Electronically Signed   By: Vinnie Langton M.D.   On: 11/24/2016 16:13   Nm Pulmonary Perf And Vent  Result Date: 11/24/2016 CLINICAL DATA:  Chronic respiratory failure through severe hypoxia. Congestive heart failure. EXAM: NUCLEAR MEDICINE VENTILATION - PERFUSION LUNG SCAN TECHNIQUE: Ventilation images were obtained in multiple projections using inhaled aerosol Tc-45m DTPA. Perfusion images were obtained in multiple projections after  intravenous injection of Tc-40m MAA. RADIOPHARMACEUTICALS:  31.3 mCi Technetium-58m DTPA aerosol inhalation and 4.2 mCi Technetium-80m MAA IV COMPARISON:  Focal full FINDINGS: Ventilation: No significant focal ventilation defect. Perfusion: No wedge shaped peripheral perfusion defects to suggest acute pulmonary embolism. IMPRESSION: No evidence suggestive recent pulmonary embolism. Electronically Signed   By: Lorriane Shire M.D.   On: 11/24/2016 10:11     Ashtan Laton M.D on 11/25/2016 at 11:53 AM  Between 7am to 7pm - Pager - [phone]  After 7pm go to [email or web address] - password [facility]  [facility] -  Office  [phone]

## 2016-11-26 DIAGNOSIS — Z7189 Other specified counseling: Secondary | ICD-10-CM

## 2016-11-26 DIAGNOSIS — I509 Heart failure, unspecified: Secondary | ICD-10-CM

## 2016-11-26 DIAGNOSIS — R0602 Shortness of breath: Secondary | ICD-10-CM

## 2016-11-26 DIAGNOSIS — Z515 Encounter for palliative care: Secondary | ICD-10-CM

## 2016-11-26 DIAGNOSIS — R0902 Hypoxemia: Secondary | ICD-10-CM

## 2016-11-26 DIAGNOSIS — J8410 Pulmonary fibrosis, unspecified: Secondary | ICD-10-CM

## 2016-11-26 LAB — EXPECTORATED SPUTUM ASSESSMENT W REFEX TO RESP CULTURE

## 2016-11-26 LAB — EXPECTORATED SPUTUM ASSESSMENT W GRAM STAIN, RFLX TO RESP C

## 2016-11-26 LAB — BRAIN NATRIURETIC PEPTIDE: B NATRIURETIC PEPTIDE 5: 1713.8 pg/mL — AB (ref 0.0–100.0)

## 2016-11-26 MED ORDER — TRAZODONE HCL 50 MG PO TABS
25.0000 mg | ORAL_TABLET | Freq: Every day | ORAL | Status: DC
  Administered 2016-11-26 – 2016-11-30 (×5): 25 mg via ORAL
  Filled 2016-11-26 (×5): qty 1

## 2016-11-26 MED ORDER — MORPHINE SULFATE 10 MG/5ML PO SOLN: 2.0000 mg | ORAL | Status: DC | PRN

## 2016-11-26 MED ORDER — FUROSEMIDE 10 MG/ML IJ SOLN
20.0000 mg | Freq: Once | INTRAMUSCULAR | Status: AC
  Administered 2016-11-26: 20 mg via INTRAVENOUS
  Filled 2016-11-26: qty 2

## 2016-11-26 NOTE — Progress Notes (Signed)
Name: Odas Blaske MRN: [id number] DOB: 30-Oct-1929    ADMISSION DATE:  11/23/2016 CONSULTATION DATE:  11/24/2016  REFERRING MD :  [facility], [facility]  CHIEF COMPLAINT:  Worsening hypoxia  HISTORY OF PRESENT ILLNESS:   82 yo former smoker with progressive dyspnea and hypoxia after outpt tx for pneumonia.  He has hx of IPF (intolerant of esbriet/ofev due to diarrhea), chronic systolic CHF, ulcerative colitis, CVA.  SIGNIFICANT EVENTS  12/27 Admit  STUDIES:  12/27 VQ scan >> negative 12/27 HRCT >> UIP pattern. Emphysema in apices. Aneurysmal dilatation of the ascending thoracic aorta (4.9 cm in diameter).   SUBJECTIVE:  Dyspneic with any activity.  VITAL SIGNS: Temp:  [98.2 F (36.8 C)-98.8 F (37.1 C)] 98.8 F (37.1 C) (12/29 1207) Pulse Rate:  [58-71] 58 (12/29 1207) Resp:  [16-24] 24 (12/29 1207) BP: (131-169)/(80-88) 169/88 (12/29 1207) SpO2:  [86 %-91 %] 86 % (12/29 1207) Weight:  [172 lb 8 oz (78.2 kg)] 172 lb 8 oz (78.2 kg) (12/29 0522)  PHYSICAL EXAMINATION: General: alert Neuro: normal strength HEENT: no stridor Cardiac: regular Chest: b/l crackles Abd: soft, non tender Ext: ankle edema Skin: no rashes    Recent Labs Lab 11/23/16 1140 11/23/16 1700 11/24/16 0407 11/25/16 0447  NA 141  --  139 138  K 4.4  --  4.0 4.0  CL 107  --  104 102  CO2 26  --  29 28  BUN 18  --  17 16  CREATININE 1.40* 1.20 1.21 1.25*  GLUCOSE 124*  --  130* 90    Recent Labs Lab 11/23/16 1700 11/24/16 0407 11/25/16 0447  HGB 16.5 14.7 14.1  HCT 50.4 44.9 43.5  WBC 6.8 5.2 7.8  PLT 81* 71* 76*   Ct Chest High Resolution  Result Date: 11/24/2016 CLINICAL DATA:  82 year old male with history of idiopathic pulmonary fibrosis. Follow-up study. EXAM: CT CHEST WITHOUT CONTRAST TECHNIQUE: Multidetector CT imaging of the chest was performed following the standard protocol without intravenous contrast. High resolution imaging of the lungs, as well as inspiratory and  expiratory imaging, was performed. COMPARISON:  Chest CT 11/10/2012. FINDINGS: Cardiovascular: Heart size is enlarged with severe right atrial dilatation. There is no significant pericardial fluid, thickening or pericardial calcification. There is aortic atherosclerosis, as well as atherosclerosis of the great vessels of the mediastinum and the coronary arteries, including calcified atherosclerotic plaque in the left main, left anterior descending, left circumflex and right coronary arteries. Status post median sternotomy for CABG, including LIMA to the LAD. Calcifications of the aortic valve. Aneurysmal dilatation of ascending thoracic aorta which measures up to 4.9 cm in diameter. Ectasia of the aortic arch which measures up to 4.1 cm in diameter distally. Mediastinum/Nodes: Multiple borderline enlarged and enlarged mediastinal and bilateral hilar lymph nodes measuring up to 2 cm in short axis in the low right paratracheal nodal station. Esophagus is unremarkable in appearance. No axillary lymphadenopathy. Lungs/Pleura: High-resolution images demonstrate widespread subpleural reticulation, parenchymal banding, traction bronchiectasis and honeycombing, all of which demonstrates a craniocaudal gradient. These findings have progressed compared to prior study from 11/10/2012, and are considered diagnostic from an imaging standpoint of usual interstitial pneumonia (UIP). Inspiratory and expiratory imaging is unremarkable. Mild diffuse bronchial wall thickening with mild to moderate centrilobular and paraseptal emphysema noted predominantly in the lung apices. Small bilateral pleural effusions lying dependently. No acute consolidative airspace disease. Upper Abdomen: Aortic atherosclerosis. Incompletely visualized low-attenuation lesion measuring at least 3.3 cm in the upper pole of left kidney, slightly  larger than remote prior study from 2013, presumably a cyst. Multiple small calcified granulomas are noted in the  liver. Musculoskeletal: Median sternotomy wires. There are no aggressive appearing lytic or blastic lesions noted in the visualized portions of the skeleton. IMPRESSION: 1. The appearance of the lungs is compatible with interstitial lung disease, and findings are considered diagnostic of usual interstitial pneumonia (UIP). 2. There is also mild diffuse bronchial wall thickening with mild to moderate centrilobular and paraseptal emphysema noted predominantly in the lung apices. 3. Cardiomegaly with severe right atrial dilatation. 4. Aortic atherosclerosis, in addition to left main and 3 vessel coronary artery disease. Status post median sternotomy for CABG, including LIMA to the LAD. 5. There are calcifications of the aortic valve. Echocardiographic correlation for evaluation of potential valvular dysfunction may be warranted if clinically indicated. 6. Aneurysmal dilatation of the ascending thoracic aorta (4.9 cm in diameter). Ascending thoracic aortic aneurysm. Recommend semi-annual imaging followup by CTA or MRA and referral to cardiothoracic surgery if not already obtained. This recommendation follows 2010 ACCF/AHA/AATS/ACR/ASA/SCA/SCAI/SIR/STS/SVM Guidelines for the Diagnosis and Management of Patients With Thoracic Aortic Disease. Circulation. 2010; 121: [id number]. 7. Additional incidental findings, as above. Electronically Signed   By: Vinnie Langton M.D.   On: 11/24/2016 16:13   SUMMARY: 82 yo male with progressive hypoxic respiratory failure.  While his ischemic cardiomyopathy and systolic heart failure are contributing to this, the most significant factor likely is progression of his pulmonary fibrosis.  ASSESSMENT / PLAN:  Acute hypoxic respiratory failure. - goal SpO2 88 to 95% - will try to arrange for oxymizer pendant  IPF. - DNR/DNI - palliative care consulted >> likely would be eligible for hospice  Acute on chronic systolic CHF. - per primary team and cardiology  Updated family at  bedside.  PCCM will f/u on 11/30/16 >> call if help needed sooner.   Chesley Mires, MD [facility] Pulmonary/Critical Care 11/26/2016, 1:26 PM Pager:  [phone] After 3pm call: [phone]

## 2016-11-26 NOTE — Progress Notes (Signed)
PROGRESS NOTE                                                                                                                                                                                                             Patient Demographics:    Chris Stokes, is a 82 y.o. male, DOB - Jun 11, 1929, [id number]  Admit date - 11/23/2016   Admitting Physician Elwin Mocha, MD  Outpatient Primary MD for the patient is [name], MD  LOS - 3  Outpatient Specialists: Pulm Dr Halford Chessman, Cards Dr Ellyn Hack  Chief Complaint  Patient presents with  . Pneumonia  . Shortness of Breath       Brief Narrative   82 y.o. male with a Past Medical History low thyroid, HLD, IPF, HTN who presents with SOB. Dx acute on chronic resp failure, No significant evidence of volume overload, respiratory failure most likely related to progression of IPF, been followed by pulmonary and cardiology, recommendation is for palliative care home will follow today.    Subjective:    Chris Stokes today has, No headache, No chest pain, No abdominal pain -reports SOB Remains the same, weakness.   Assessment  & Plan :    Principal Problem:   Acute on chronic respiratory failure with hypoxia (HCC) Active Problems:   Hypothyroidism   Hyperlipidemia with target LDL less than 70; Statin intolerant   Essential hypertension   Coronary artery disease involving native heart without angina pectoris   IPF (idiopathic pulmonary fibrosis) (HCC)   Systolic CHF (HCC)   AKI (acute kidney injury) (HCC)   Thrombocytopenia (HCC)   Pressure injury of skin  Acute on chronic hypoxic respiratory failure - VQ scan negative for PE, stopped Lovenox - No evidence of volume overload on chest x-ray or clinical exam, but significantly elevated BNP, known cardiomyopathy at baseline, Consult cardiology to see if they have any recommendation. - With known history of IPF, likely is the major contributing factor to  his respiratory failure,so  pulmonary consulted.  Ischemic  cardiomyopathy with EF 25-30% - Most recent echo 08/2016 with EF 25-30% with diffuse hypokinesis and grade 1 diastolic dysfunction - continue with Coreg, Cozaar and Aldactone, daily weight, strict ins and outs, Diuresis per cardiology, will receive extra dose of IV Lasix today   IPF - Continue with oxygen, PCCM consult appreciated, recommend  palliative medicine involvement  Ascending aortic aneurysm 4.9cm was 4.4 on prior CT. - Doubt he is a candidate for any intervention on this,  - CTA or MRA in 6 month.  Thrombocytopenia - New onset, will continue to monitor, patient usually baseline on the lower side 120s to 130s, no evidence of active bleed, no indication for transfusion  Hyperlipidemia - Continue statin  Hypertension - Continue with home medication  Hypothyroidism - Continue Synthroid  Code Status : DNR  Family Communication  : None at bedside  Disposition Plan  : Awaiting palliative medicine consulted for recommendation  Consults  :  PCCM, cardiology  Procedures  : None  DVT Prophylaxis  :  SCDs , no chemical prophylaxis secondary to significant thrombocytopenia  Lab Results  Component Value Date   PLT 76 (L) 11/25/2016    Antibiotics  :   Anti-infectives    Start     Dose/Rate Route Frequency Ordered Stop   11/23/16 1800  cefTRIAXone (ROCEPHIN) 1 g in dextrose 5 % 50 mL IVPB  Status:  Discontinued     1 g 100 mL/hr over 30 Minutes Intravenous Every 24 hours 11/23/16 1628 11/23/16 1705   11/23/16 1800  azithromycin (ZITHROMAX) 500 mg in dextrose 5 % 250 mL IVPB  Status:  Discontinued     500 mg 250 mL/hr over 60 Minutes Intravenous Every 24 hours 11/23/16 1628 11/23/16 1705        Objective:   Vitals:   11/25/16 2134 11/26/16 0522 11/26/16 0920 11/26/16 1207  BP:  (!) 146/80  (!) 169/88  Pulse:  (!) 58  (!) 58  Resp:  18  (!) 24  Temp:  98.2 F (36.8 C)  98.8 F (37.1 C)  TempSrc:   Oral  Oral  SpO2: (!) 88% (!) 87% 91% (!) 86%  Weight:  78.2 kg (172 lb 8 oz)    Height:        Wt Readings from Last 3 Encounters:  11/26/16 78.2 kg (172 lb 8 oz)  11/01/16 83.5 kg (184 lb)  10/25/16 83.6 kg (184 lb 6.4 oz)     Intake/Output Summary (Last 24 hours) at 11/26/16 1352 Last data filed at 11/26/16 0900  Gross per 24 hour  Intake              440 ml  Output             2051 ml  Net            -1611 ml     Physical Exam  Awake Alert, Oriented X 3,frail. Supple Neck,No JVD.  Symmetrical Chest wall movement, Good air movement bilaterally, Bibasilar Rales RRR,No Gallops,Rubs or new Murmurs, No Parasternal Heave +ve B.Sounds, Abd Soft, No tenderness,  No rebound - guarding or rigidity. No Cyanosis, Clubbing or edema, No new Rash or bruise     Data Review:    CBC  Recent Labs Lab 11/23/16 1140 11/23/16 1700 11/24/16 0407 11/25/16 0447  WBC 6.0 6.8 5.2 7.8  HGB 15.0 16.5 14.7 14.1  HCT 46.4 50.4 44.9 43.5  PLT 77* 81* 71* 76*  MCV 103.3* 104.8* 102.7* 103.1*  MCH 33.4 34.3* 33.6 33.4  MCHC 32.3 32.7 32.7 32.4  RDW 14.8 14.7 14.4 14.6  LYMPHSABS 0.8  --   --   --   MONOABS 0.6  --   --   --   EOSABS 0.2  --   --   --   BASOSABS 0.0  --   --   --  Chemistries   Recent Labs Lab 11/23/16 1140 11/23/16 1700 11/23/16 2236 11/24/16 0407 11/25/16 0447  NA 141  --   --  139 138  K 4.4  --   --  4.0 4.0  CL 107  --   --  104 102  CO2 26  --   --  29 28  GLUCOSE 124*  --   --  130* 90  BUN 18  --   --  17 16  CREATININE 1.40* 1.20  --  1.21 1.25*  CALCIUM 9.4  --   --  9.2 9.0  MG  --   --  1.8  --   --    ------------------------------------------------------------------------------------------------------------------ No results for input(s): CHOL, HDL, LDLCALC, TRIG, CHOLHDL, LDLDIRECT in the last 72 hours.  No results found for:  HGBA1C ------------------------------------------------------------------------------------------------------------------ No results for input(s): TSH, T4TOTAL, T3FREE, THYROIDAB in the last 72 hours.  Invalid input(s): FREET3 ------------------------------------------------------------------------------------------------------------------ No results for input(s): VITAMINB12, FOLATE, FERRITIN, TIBC, IRON, RETICCTPCT in the last 72 hours.  Coagulation profile No results for input(s): INR, PROTIME in the last 168 hours.  No results for input(s): DDIMER in the last 72 hours.  Cardiac Enzymes  Recent Labs Lab 11/23/16 1700 11/23/16 2236 11/24/16 0407  TROPONINI 0.03* 0.03* 0.03*   ------------------------------------------------------------------------------------------------------------------    Component Value Date/Time   BNP 1,713.8 (H) 11/26/2016 0317    Inpatient Medications  Scheduled Meds: . acidophilus   Oral Daily  . amLODipine  10 mg Oral Daily  . carvedilol  12.5 mg Oral BID WC  . donepezil  10 mg Oral QHS  . folic acid  1 mg Oral Daily  . furosemide  20 mg Intravenous Once  . furosemide  40 mg Oral Daily  . ipratropium-albuterol  3 mL Nebulization TID  . levothyroxine  150 mcg Oral QAC breakfast  . losartan  100 mg Oral Daily  . mouth rinse  15 mL Mouth Rinse BID  . mirabegron ER  50 mg Oral Daily  . omega-3 acid ethyl esters  1 g Oral Daily  . spironolactone  12.5 mg Oral Daily   Continuous Infusions: PRN Meds:.albuterol, polyvinyl alcohol, technetium TC 73M diethylenetriame-pentaacetic acid  Micro Results Recent Results (from the past 240 hour(s))  Urine culture     Status: None   Collection Time: 11/23/16  2:25 PM  Result Value Ref Range Status   Specimen Description URINE, CATHETERIZED  Final   Special Requests NONE  Final   Culture NO GROWTH  Final   Report Status 11/24/2016 FINAL  Final  MRSA PCR Screening     Status: None   Collection Time:  11/23/16  4:59 PM  Result Value Ref Range Status   MRSA by PCR NEGATIVE NEGATIVE Final    Comment:        The GeneXpert MRSA Assay (FDA approved for NASAL specimens only), is one component of a comprehensive MRSA colonization surveillance program. It is not intended to diagnose MRSA infection nor to guide or monitor treatment for MRSA infections.   Culture, blood (routine x 2) Call MD if unable to obtain prior to antibiotics being given     Status: None (Preliminary result)   Collection Time: 11/23/16  5:00 PM  Result Value Ref Range Status   Specimen Description BLOOD LEFT ARM  Final   Special Requests BOTTLES DRAWN AEROBIC ONLY 6CC  Final   Culture NO GROWTH 2 DAYS  Final   Report Status PENDING  Incomplete  Culture, blood (routine x  2) Call MD if unable to obtain prior to antibiotics being given     Status: None (Preliminary result)   Collection Time: 11/23/16  5:10 PM  Result Value Ref Range Status   Specimen Description BLOOD LEFT HAND  Final   Special Requests BOTTLES DRAWN AEROBIC ONLY  [place]  Final   Culture NO GROWTH 2 DAYS  Final   Report Status PENDING  Incomplete    Radiology Reports Dg Chest 2 View  Result Date: 11/23/2016 CLINICAL DATA:  Short of breath and cough. History of pulmonary fibrosis. EXAM: CHEST  2 VIEW COMPARISON:  07/16/2016 FINDINGS: Prior median sternotomy. Midline trachea. Moderate cardiomegaly. Right superior mediastinal soft tissue fullness is present back to 06/03/2014 and is likely attributed to tortuous aorta and possibly mild adenopathy. Bilateral pleural thickening or trace pleural fluid. No pneumothorax. Severe interstitial lung disease is lower lobe and peripheral predominant. Given this underlying chronic finding, there is no convincing evidence of superimposed acute pneumonia or overt congestive failure. Apparent increase in interstitial prominence is felt to be due to AP portable technique and resultant decreased inspiratory effort.  IMPRESSION: Interstitial lung disease, consistent with the history of pulmonary fibrosis. No convincing evidence of acute superimposed process. Cardiomegaly.  No overt congestive failure. Electronically Signed   By: Abigail Miyamoto M.D.   On: 11/23/2016 12:14   Ct Chest High Resolution  Result Date: 11/24/2016 CLINICAL DATA:  82 year old male with history of idiopathic pulmonary fibrosis. Follow-up study. EXAM: CT CHEST WITHOUT CONTRAST TECHNIQUE: Multidetector CT imaging of the chest was performed following the standard protocol without intravenous contrast. High resolution imaging of the lungs, as well as inspiratory and expiratory imaging, was performed. COMPARISON:  Chest CT 11/10/2012. FINDINGS: Cardiovascular: Heart size is enlarged with severe right atrial dilatation. There is no significant pericardial fluid, thickening or pericardial calcification. There is aortic atherosclerosis, as well as atherosclerosis of the great vessels of the mediastinum and the coronary arteries, including calcified atherosclerotic plaque in the left main, left anterior descending, left circumflex and right coronary arteries. Status post median sternotomy for CABG, including LIMA to the LAD. Calcifications of the aortic valve. Aneurysmal dilatation of ascending thoracic aorta which measures up to 4.9 cm in diameter. Ectasia of the aortic arch which measures up to 4.1 cm in diameter distally. Mediastinum/Nodes: Multiple borderline enlarged and enlarged mediastinal and bilateral hilar lymph nodes measuring up to 2 cm in short axis in the low right paratracheal nodal station. Esophagus is unremarkable in appearance. No axillary lymphadenopathy. Lungs/Pleura: High-resolution images demonstrate widespread subpleural reticulation, parenchymal banding, traction bronchiectasis and honeycombing, all of which demonstrates a craniocaudal gradient. These findings have progressed compared to prior study from 11/10/2012, and are considered  diagnostic from an imaging standpoint of usual interstitial pneumonia (UIP). Inspiratory and expiratory imaging is unremarkable. Mild diffuse bronchial wall thickening with mild to moderate centrilobular and paraseptal emphysema noted predominantly in the lung apices. Small bilateral pleural effusions lying dependently. No acute consolidative airspace disease. Upper Abdomen: Aortic atherosclerosis. Incompletely visualized low-attenuation lesion measuring at least 3.3 cm in the upper pole of left kidney, slightly larger than remote prior study from 2013, presumably a cyst. Multiple small calcified granulomas are noted in the liver. Musculoskeletal: Median sternotomy wires. There are no aggressive appearing lytic or blastic lesions noted in the visualized portions of the skeleton. IMPRESSION: 1. The appearance of the lungs is compatible with interstitial lung disease, and findings are considered diagnostic of usual interstitial pneumonia (UIP). 2. There is also mild diffuse bronchial  wall thickening with mild to moderate centrilobular and paraseptal emphysema noted predominantly in the lung apices. 3. Cardiomegaly with severe right atrial dilatation. 4. Aortic atherosclerosis, in addition to left main and 3 vessel coronary artery disease. Status post median sternotomy for CABG, including LIMA to the LAD. 5. There are calcifications of the aortic valve. Echocardiographic correlation for evaluation of potential valvular dysfunction may be warranted if clinically indicated. 6. Aneurysmal dilatation of the ascending thoracic aorta (4.9 cm in diameter). Ascending thoracic aortic aneurysm. Recommend semi-annual imaging followup by CTA or MRA and referral to cardiothoracic surgery if not already obtained. This recommendation follows 2010 ACCF/AHA/AATS/ACR/ASA/SCA/SCAI/SIR/STS/SVM Guidelines for the Diagnosis and Management of Patients With Thoracic Aortic Disease. Circulation. 2010; 121: [id number]. 7. Additional incidental  findings, as above. Electronically Signed   By: Vinnie Langton M.D.   On: 11/24/2016 16:13   Nm Pulmonary Perf And Vent  Result Date: 11/24/2016 CLINICAL DATA:  Chronic respiratory failure through severe hypoxia. Congestive heart failure. EXAM: NUCLEAR MEDICINE VENTILATION - PERFUSION LUNG SCAN TECHNIQUE: Ventilation images were obtained in multiple projections using inhaled aerosol Tc-32m DTPA. Perfusion images were obtained in multiple projections after intravenous injection of Tc-20m MAA. RADIOPHARMACEUTICALS:  31.3 mCi Technetium-57m DTPA aerosol inhalation and 4.2 mCi Technetium-2m MAA IV COMPARISON:  Focal full FINDINGS: Ventilation: No significant focal ventilation defect. Perfusion: No wedge shaped peripheral perfusion defects to suggest acute pulmonary embolism. IMPRESSION: No evidence suggestive recent pulmonary embolism. Electronically Signed   By: Lorriane Shire M.D.   On: 11/24/2016 10:11     Chares Slaymaker M.D on 11/26/2016 at 1:52 PM  Between 7am to 7pm - Pager - [phone]  After 7pm go to [email or web address] - password [facility]  [facility] -  Office  [phone]

## 2016-11-26 NOTE — Progress Notes (Addendum)
Patient Name: Chris Stokes Date of Encounter: 11/26/2016  Primary Cardiologist: Dr. Beckie Salts Problem List     Principal Problem:   Acute on chronic respiratory failure with hypoxia [facility]) Active Problems:   Hypothyroidism   Hyperlipidemia with target LDL less than 70; Statin intolerant   Essential hypertension   Coronary artery disease involving native heart without angina pectoris   IPF (idiopathic pulmonary fibrosis) (HCC)   Systolic CHF (HCC)   AKI (acute kidney injury) ([place])   Thrombocytopenia (HCC)   Pressure injury of skin     Subjective   Breathing remains the same today. Reports may a minimal improvement.   Inpatient Medications    Scheduled Meds: . acidophilus   Oral Daily  . amLODipine  10 mg Oral Daily  . carvedilol  12.5 mg Oral BID WC  . donepezil  10 mg Oral QHS  . folic acid  1 mg Oral Daily  . furosemide  40 mg Oral Daily  . ipratropium-albuterol  3 mL Nebulization TID  . levothyroxine  150 mcg Oral QAC breakfast  . losartan  100 mg Oral Daily  . mouth rinse  15 mL Mouth Rinse BID  . mirabegron ER  50 mg Oral Daily  . omega-3 acid ethyl esters  1 g Oral Daily  . spironolactone  12.5 mg Oral Daily   Continuous Infusions:  PRN Meds: albuterol, polyvinyl alcohol, technetium TC 52M diethylenetriame-pentaacetic acid   Vital Signs    Vitals:   11/25/16 2100 11/25/16 2134 11/26/16 0522 11/26/16 0920  BP: 131/83  (!) 146/80   Pulse: 68  (!) 58   Resp: 18  18   Temp: 98.3 F (36.8 C)  98.2 F (36.8 C)   TempSrc: Oral  Oral   SpO2: (!) 89% (!) 88% (!) 87% 91%  Weight:   172 lb 8 oz (78.2 kg)   Height:        Intake/Output Summary (Last 24 hours) at 11/26/16 1147 Last data filed at 11/26/16 0900  Gross per 24 hour  Intake              560 ml  Output             3651 ml  Net            -3091 ml   Filed Weights   11/24/16 0453 11/25/16 0705 11/26/16 0522  Weight: 176 lb 4.8 oz (80 kg) 178 lb 1.6 oz (80.8 kg) 172 lb 8 oz (78.2 kg)      Physical Exam    GEN: Pleasant older WM, in no acute distress.  HEENT: Grossly normal.  Neck: Supple, + JVD, carotid bruits, or masses. Cardiac: RRR, 2/6 systolic murmur, rubs, or gallops. No clubbing, cyanosis, edema.  Radials/DP/PT 2+ and equal bilaterally.  Respiratory:  Respirations regular and unlabored, clear to auscultation bilaterally. GI: Soft, nontender, nondistended, BS + x 4. MS: no deformity or atrophy. Skin: warm and dry, no rash. Neuro:  Strength and sensation are intact. Psych: AAOx3.  Normal affect.  Labs    CBC  Recent Labs  11/24/16 0407 11/25/16 0447  WBC 5.2 7.8  HGB 14.7 14.1  HCT 44.9 43.5  MCV 102.7* 103.1*  PLT 71* 76*   Basic Metabolic Panel  Recent Labs  11/23/16 2236 11/24/16 0407 11/25/16 0447  NA  --  139 138  K  --  4.0 4.0  CL  --  104 102  CO2  --  29 28  GLUCOSE  --  130* 90  BUN  --  17 16  CREATININE  --  1.21 1.25*  CALCIUM  --  9.2 9.0  MG 1.8  --   --   PHOS 2.5  --   --    Liver Function Tests No results for input(s): AST, ALT, ALKPHOS, BILITOT, PROT, ALBUMIN in the last 72 hours. No results for input(s): LIPASE, AMYLASE in the last 72 hours. Cardiac Enzymes  Recent Labs  11/23/16 1700 11/23/16 2236 11/24/16 0407  TROPONINI 0.03* 0.03* 0.03*   BNP Invalid input(s): POCBNP D-Dimer No results for input(s): DDIMER in the last 72 hours. Hemoglobin A1C No results for input(s): HGBA1C in the last 72 hours. Fasting Lipid Panel No results for input(s): CHOL, HDL, LDLCALC, TRIG, CHOLHDL, LDLDIRECT in the last 72 hours. Thyroid Function Tests No results for input(s): TSH, T4TOTAL, T3FREE, THYROIDAB in the last 72 hours.  Invalid input(s): FREET3  Telemetry    SR - Personally Reviewed  ECG    N/A - Personally Reviewed  Radiology    Ct Chest High Resolution  Result Date: 11/24/2016 CLINICAL DATA:  82 year old male with history of idiopathic pulmonary fibrosis. Follow-up study. EXAM: CT CHEST WITHOUT  CONTRAST TECHNIQUE: Multidetector CT imaging of the chest was performed following the standard protocol without intravenous contrast. High resolution imaging of the lungs, as well as inspiratory and expiratory imaging, was performed. COMPARISON:  Chest CT 11/10/2012. FINDINGS: Cardiovascular: Heart size is enlarged with severe right atrial dilatation. There is no significant pericardial fluid, thickening or pericardial calcification. There is aortic atherosclerosis, as well as atherosclerosis of the great vessels of the mediastinum and the coronary arteries, including calcified atherosclerotic plaque in the left main, left anterior descending, left circumflex and right coronary arteries. Status post median sternotomy for CABG, including LIMA to the LAD. Calcifications of the aortic valve. Aneurysmal dilatation of ascending thoracic aorta which measures up to 4.9 cm in diameter. Ectasia of the aortic arch which measures up to 4.1 cm in diameter distally. Mediastinum/Nodes: Multiple borderline enlarged and enlarged mediastinal and bilateral hilar lymph nodes measuring up to 2 cm in short axis in the low right paratracheal nodal station. Esophagus is unremarkable in appearance. No axillary lymphadenopathy. Lungs/Pleura: High-resolution images demonstrate widespread subpleural reticulation, parenchymal banding, traction bronchiectasis and honeycombing, all of which demonstrates a craniocaudal gradient. These findings have progressed compared to prior study from 11/10/2012, and are considered diagnostic from an imaging standpoint of usual interstitial pneumonia (UIP). Inspiratory and expiratory imaging is unremarkable. Mild diffuse bronchial wall thickening with mild to moderate centrilobular and paraseptal emphysema noted predominantly in the lung apices. Small bilateral pleural effusions lying dependently. No acute consolidative airspace disease. Upper Abdomen: Aortic atherosclerosis. Incompletely visualized  low-attenuation lesion measuring at least 3.3 cm in the upper pole of left kidney, slightly larger than remote prior study from 2013, presumably a cyst. Multiple small calcified granulomas are noted in the liver. Musculoskeletal: Median sternotomy wires. There are no aggressive appearing lytic or blastic lesions noted in the visualized portions of the skeleton. IMPRESSION: 1. The appearance of the lungs is compatible with interstitial lung disease, and findings are considered diagnostic of usual interstitial pneumonia (UIP). 2. There is also mild diffuse bronchial wall thickening with mild to moderate centrilobular and paraseptal emphysema noted predominantly in the lung apices. 3. Cardiomegaly with severe right atrial dilatation. 4. Aortic atherosclerosis, in addition to left main and 3 vessel coronary artery disease. Status post median sternotomy for CABG, including LIMA to the LAD. 5. There are  calcifications of the aortic valve. Echocardiographic correlation for evaluation of potential valvular dysfunction may be warranted if clinically indicated. 6. Aneurysmal dilatation of the ascending thoracic aorta (4.9 cm in diameter). Ascending thoracic aortic aneurysm. Recommend semi-annual imaging followup by CTA or MRA and referral to cardiothoracic surgery if not already obtained. This recommendation follows 2010 ACCF/AHA/AATS/ACR/ASA/SCA/SCAI/SIR/STS/SVM Guidelines for the Diagnosis and Management of Patients With Thoracic Aortic Disease. Circulation. 2010; 121: [id number]. 7. Additional incidental findings, as above. Electronically Signed   By: Vinnie Langton M.D.   On: 11/24/2016 16:13    Cardiac Studies   None  Patient Profile     82 y.o. male with past medical history of CAD (s/p CABG in 2004 with LIMA-LAD, SVG-RI, SVG-rPDA-rPL), chronic combined systolic and diastolic CHF (EF 123XX123 by echo in 08/2016), ischemic cardiomyopathy, and pulmonary fibrosis (on home O2) who presented to [facility] ED on  11/23/2016 for acute hypoxic respiratory failure.  Assessment & Plan    1. Acute on Chronic Combined Systolic and Diastolic CHF - diagnosed with PNA on 11/18/2016 and noted to have acute hypoxic respiratory failure with saturations at 78% on admission (baseline O2 saturation is at 85% on room air). BNP elevated to 1680 on admission with CXR showing interstitial lung disease, consistent with the history of pulmonary fibrosis but no evidence of acute superimposed processes. VQ scan was negative for PE.  - EF 25-30% by echo in 08/2016. Was started on PO Lasix 20mg  daily on admission with this being increased to 40mg  daily earlier today. He reports good urine output and is overall -2.2L since admission. Weight down 4 lbs.  - while he is responding to Lasix, he reports no improvement in his respiratory status with diuresis and he does not appear volume overloaded on physical exam. Repeat BNP this morning shows further elevated level of 1713. Will give 20mg  IV lasix today in addition to PO. Follow blood pressure and renal function.  2. CAD - s/p CABG in 2004 with LIMA-LAD, SVG-RI, SVG-rPDA-rPL. Cath in 08/2016 showed extensive CAD with a new 75% LM lesion with patent LIMA-LAD, Seq SVG-rPDA-rPL with the initial limb to the PDA being widely patent and the distal limb 90% stenosed, and SVG-RI occluded. PCI not performed and medical management recommended.  - continue ASA, Amlodipine, BB and Losartan. Intolerant to statin therapy.   3. HTN - BP at 131/89 - 156/104 in the past 24 hours.  - continue Amlodipine, Coreg, Lasix, and Losartan. Can consider increasing Coreg dosing or starting Imdur if BP remains elevated.  4. Pulmonary Fibrosis - Pulmonology following.   5. AKI - creatinine 1.40 on admission. Improved to 1.25 yesterday.   6. Ascending Thoracic Aorta Aneurysm - 4.9 cm in diameter by CT this admission. Semi-annual imaging recommended, however with his age and co-morbidities, he would not be  a good surgical candidate.   Signed, Reino Bellis, NP  11/26/2016, 11:47 AM   The patient has been seen in conjunction with Reino Bellis, NP. All aspects of care have been considered and discussed. The patient has been personally interviewed, examined, and all clinical data has been reviewed.   Dyspnea still present. No worse. Good diuresis without significant deterioration in kidney function.  Continue diuresis as tolerated by kidney function with with hope that volume removal may have some marginal improvement in respiratory status.

## 2016-11-26 NOTE — Evaluation (Signed)
Occupational Therapy Evaluation Patient Details Name: Chris Stokes MRN: [id number] DOB: [birth year] Today's Date: 11/26/2016    History of Present Illness Pt. admitted for hypoxic respiratory failure. pnt was dx with PNA 11-18-16   Clinical Impression   Pateint was a resident of ALF and would like to go back there when able. Patient is an agreement with [facility] SNF. Pt. O2 sats drop with minimal activity. Patient would benefit from OT for education and maximize pt. Ability with transfers and ADLs.     Follow Up Recommendations  SNF    Equipment Recommendations  None recommended by OT    Recommendations for Other Services       Precautions / Restrictions Precautions Precautions: Fall Precaution Comments: check sats Restrictions Weight Bearing Restrictions: No      Mobility Bed Mobility Overal bed mobility: Needs Assistance Bed Mobility: Sit to Supine       Sit to supine: Supervision   General bed mobility comments:  (O2 sats dropped to 82%)  Transfers                      Balance                                            ADL Overall ADL's : Needs assistance/impaired Eating/Feeding: Independent   Grooming: Wash/dry hands;Wash/dry face;Supervision/safety;Set up;Sitting   Upper Body Bathing: Supervision/ safety;Set up;Sitting   Lower Body Bathing: Maximal assistance;Sit to/from stand   Upper Body Dressing : Minimal assistance;Sitting   Lower Body Dressing: Maximal assistance;Sit to/from stand               Functional mobility during ADLs:  (supine to sit O2 sats dropped to 82%) General ADL Comments: Pnt. O2 sats drop into mid 80s with activity and rebouned back up to 89%.      Vision     Perception     Praxis      Pertinent Vitals/Pain Pain Assessment: No/denies pain     Hand Dominance     Extremity/Trunk Assessment Upper Extremity Assessment Upper Extremity Assessment: Overall WFL for tasks assessed            Communication Communication Communication: No difficulties   Cognition Arousal/Alertness: Awake/alert Behavior During Therapy: WFL for tasks assessed/performed Overall Cognitive Status: Within Functional Limits for tasks assessed                     General Comments       Exercises       Shoulder Instructions      Home Living Family/patient expects to be discharged to:: Skilled nursing facility                                        Prior Functioning/Environment Level of Independence: Needs assistance  Gait / Transfers Assistance Needed: patient states he walked with a walker for short distance but relied manly on w/c ADL's / Homemaking Assistance Needed: He states they would help a little bit with ADL but did most of it himself.            OT Problem List: Decreased strength;Decreased activity tolerance;Decreased knowledge of use of DME or AE;Cardiopulmonary status limiting activity   OT Treatment/Interventions: Self-care/ADL training;Energy conservation;DME and/or AE instruction;Therapeutic activities;Patient/family  education    OT Goals(Current goals can be found in the care plan section) Acute Rehab OT Goals Patient Stated Goal:  (go back to ALF) OT Goal Formulation: With patient Time For Goal Achievement: 12/10/16 Potential to Achieve Goals: Fair ADL Goals Pt Will Perform Upper Body Bathing: with modified independence;sitting Pt Will Perform Lower Body Bathing: with supervision;sit to/from stand;with adaptive equipment Pt Will Perform Upper Body Dressing: with modified independence;sitting Pt Will Perform Lower Body Dressing: with supervision;sit to/from stand;with adaptive equipment Pt Will Transfer to Toilet: with modified independence;bedside commode Additional ADL Goal #1: Pt. to state 3 energy conservation techniques  OT Frequency: Min 2X/week   Barriers to D/C: Decreased caregiver support   (plans of SNF)        Co-evaluation              End of Session Equipment Utilized During Treatment: Oxygen Nurse Communication:  (pt. tx limted by O2 sats.)  Activity Tolerance: Other (comment) Patient left: in bed;with call bell/phone within reach   Time: 0831-0909 OT Time Calculation (min): 38 min Charges:  OT General Charges $OT Visit: 1 Procedure OT Evaluation $OT Eval Moderate Complexity: 1 Procedure OT Treatments $Self Care/Home Management : 8-22 mins G-Codes:    Basilia Stuckert 12/08/2016, 9:09 AM

## 2016-11-26 NOTE — Consult Note (Signed)
Consultation Note Date: 11/26/2016   Patient Name: Chris Stokes  DOB: 09/14/1929  MRN: [id number]  Age / Sex: 82 y.o., male  PCP: Cassandria Anger, MD Referring Physician: Albertine Patricia, MD  Reason for Consultation: Establishing goals of care  HPI/Patient Profile: 82 y.o. male  with past medical history of hypothyroidism, HLD, HTN, systolic CHF, pulmonary fibrosis, CKD stage III, mild dementia admitted on 11/23/2016 with worsening SOB.   Clinical Assessment and Goals of Care: I met today with Mr. Gorby but first with his daughters, [name] and Manuela Schwartz. We discussed his decline especially over the past month. They explain his declining QOL and how he has told them in the past that that when he can't get out and keep going "that will be the end of me." They say that [place] and has always been very active. They believe that he is very aware of his poor prognosis as he has mentioned that he feared he would die before his wife which has been his fear. Unfortunately his wife has had multiple strokes and requires much care (although they have required care assistance for her the past 1.5 years).  We then met together with Mr. Ellithorpe. We discussed prognosis, expectations, hopes, and concerns. He is aware that his pulmonary fibrosis has progressed as expected. He is still hopeful for some improvement in breathing after being further diuresed. After much discussion he has decided to optimize his breathing and health now, transition back to ALF with hospice (and likely increase caregiver support there), not to return to the hospital and focus on QOL and symptom management. We also discussed hospice facility as an option if care needs increase and symptoms difficult to manage.   Able to discuss fears and readiness for EOL. Therapeutic listening and emotional support provided.   Primary Decision Maker HCPOA  daughters [name] and Manuela Schwartz    SUMMARY OF RECOMMENDATIONS   - Optimize health and respiratory status (diurese as tolerated) - Plan for transition to return to ALF with hospice to focus on comfort and QOL  Code Status/Advance Care Planning:  DNR   Symptom Management:   Insomnia: Trazodone 25 mg qhs trial tonight.   Dyspnea: Optimize oxygen and recs per PCCM. Diurese per cardiology. Morphine 2 mg every 2 hours prn.   LBM 11/25/16. If needing more morphine may consider scheduled senokot.   Palliative Prophylaxis:   Aspiration, Bowel Regimen, Delirium Protocol and Frequent Pain Assessment  Additional Recommendations (Limitations, Scope, Preferences):  Avoid Hospitalization  Psycho-social/Spiritual:   Desire for further Chaplaincy support:no  Additional Recommendations: Caregiving  Support/Resources and Education on Hospice  Prognosis:   < 6 months  Discharge Planning: Hopeful for return to ALF with hospice.       Primary Diagnoses: Present on Admission: . Acute on chronic respiratory failure with hypoxia ([place]) . Essential hypertension . Coronary artery disease involving native heart without angina pectoris . Hyperlipidemia with target LDL less than 70; Statin intolerant . IPF (idiopathic pulmonary fibrosis) ([place]) . Hypothyroidism  I have reviewed the medical record, interviewed the patient and family, and examined the patient. The following aspects are pertinent.  Past Medical History:  Diagnosis Date  . Cataract 1999   bilateral cataracts   . Chronic kidney disease    multiple kidney stone flare ups  . Colon polyps    adenomatous  . Coronary artery disease, occlusive    Dr. Mare Ferrari; Cath 02/2003 for Inf STEMI -> dLM CAD with moderate to severe LAD, RI & Cx stenoses, subtotal RCA.--> CABG x 4: LIMA-LAD, SVG-RI, SVG-rPDA-rPL  . GERD (gastroesophageal reflux disease) 2012  . Hiatal hernia   . Hyperlipidemia   . Hypertension   . Hypothyroidism   .  Pulmonary fibrosis ([place]) 12/08/2012   on Home O2 (asbestos exposure); Dr. Halford Chessman  . Rectal fissure   . Seasonal allergies   . ST elevation myocardial infarction (STEMI) of inferior wall (HCC) 1984   Inferior MI  . Subsequent ST elevation (STEMI) myocardial infarction of inferior wall 02/2003   Subtotal RCA occlusion -- MV CAD on Cath --> CABG x 4  . Ulcerative colitis    Dr Henrene Pastor   Social History   Social History  . Marital status: Married    Spouse name: N/A  . Number of children: 2  . Years of education: N/A   Occupational History  . retired   .     Social History Main Topics  . Smoking status: Former Smoker    Packs/day: 2.00    Years: 25.00    Types: Cigarettes    Quit date: 11/29/1966  . Smokeless tobacco: Never Used  . Alcohol use No  . Drug use: No  . Sexual activity: No   Other Topics Concern  . None   Social History Narrative  . None   Family History  Problem Relation Age of Onset  . Arthritis/Rheumatoid Mother   . Hypertension Father   . Emphysema Father   . Hyperlipidemia Father   . Colon cancer Cousin     mat side  . Lymphoma Sister   . Hypertension Other   . Emphysema Brother   . Hyperlipidemia Sister   . Hyperlipidemia Brother    Scheduled Meds: . acidophilus   Oral Daily  . amLODipine  10 mg Oral Daily  . carvedilol  12.5 mg Oral BID WC  . donepezil  10 mg Oral QHS  . folic acid  1 mg Oral Daily  . furosemide  40 mg Oral Daily  . ipratropium-albuterol  3 mL Nebulization TID  . levothyroxine  150 mcg Oral QAC breakfast  . losartan  100 mg Oral Daily  . mouth rinse  15 mL Mouth Rinse BID  . mirabegron ER  50 mg Oral Daily  . omega-3 acid ethyl esters  1 g Oral Daily  . spironolactone  12.5 mg Oral Daily   Continuous Infusions: PRN Meds:.albuterol, polyvinyl alcohol, technetium TC 15M diethylenetriame-pentaacetic acid Allergies  Allergen Reactions  . Lipitor [Atorvastatin] Other (See Comments)    CRAMPS  . Ramipril Cough  .  Simvastatin Other (See Comments)    MYALGIA  . Sulfonamide Derivatives Rash   Review of Systems  Constitutional: Positive for activity change, appetite change and fatigue.  Respiratory: Positive for shortness of breath.   Neurological: Positive for weakness.  Psychiatric/Behavioral: Positive for sleep disturbance.    Physical Exam  Constitutional: He appears well-developed.  HENT:  Head: Normocephalic and atraumatic.  Cardiovascular: An irregular rhythm present. Bradycardia present.   Pulmonary/Chest: No accessory  muscle usage.  Mild - mod distress at rest  Abdominal: Soft. Normal appearance.  Neurological: He is alert. He is disoriented.  Nursing note and vitals reviewed.   Vital Signs: BP (!) 169/88 (BP Location: Right Arm)   Pulse (!) 58   Temp 98.8 F (37.1 C) (Oral)   Resp (!) 24   Ht 5' 10" (1.778 m)   Wt 78.2 kg (172 lb 8 oz)   SpO2 (!) 89%   BMI 24.75 kg/m  Pain Assessment: 0-10   Pain Score: 0-No pain   SpO2: SpO2: (!) 89 % O2 Device:SpO2: (!) 89 % O2 Flow Rate: .O2 Flow Rate (L/min): 6 L/min  IO: Intake/output summary:  Intake/Output Summary (Last 24 hours) at 11/26/16 1714 Last data filed at 11/26/16 1629  Gross per 24 hour  Intake              560 ml  Output             2401 ml  Net            -1841 ml    LBM: Last BM Date: 11/25/16 Baseline Weight: Weight: 82.8 kg (182 lb 8 oz) Most recent weight: Weight: 78.2 kg (172 lb 8 oz)     Palliative Assessment/Data: PPS: 30%     Time In: 1530 Time Out: 1700 Time Total: 90min Greater than 50%  of this time was spent counseling and coordinating care related to the above assessment and plan.  Signed by:  , NP Palliative Medicine Team Pager # [phone] (M-F 8a-5p) Team Phone # [phone] (Nights/Weekends)                

## 2016-11-26 NOTE — Progress Notes (Signed)
PT Cancellation Note  Patient Details Name: Quion Hirsh MRN: [id number] DOB: [birth year]   Cancelled Treatment:    Reason Eval/Treat Not Completed: Other (comment). Palliative consult in progress. PT will continue to f/u with pt for evaluation if appropriate after Palliative's recommendations.    [place] 11/26/2016, 4:41 PM

## 2016-11-26 NOTE — Progress Notes (Signed)
[facility] called for oxymizer pendant as requested. Mindi Slicker [facility] [phone]

## 2016-11-27 DIAGNOSIS — E034 Atrophy of thyroid (acquired): Secondary | ICD-10-CM

## 2016-11-27 LAB — BASIC METABOLIC PANEL
ANION GAP: 7 (ref 5–15)
BUN: 12 mg/dL (ref 6–20)
CHLORIDE: 100 mmol/L — AB (ref 101–111)
CO2: 32 mmol/L (ref 22–32)
Calcium: 9.2 mg/dL (ref 8.9–10.3)
Creatinine, Ser: 1.03 mg/dL (ref 0.61–1.24)
Glucose, Bld: 84 mg/dL (ref 65–99)
POTASSIUM: 3.3 mmol/L — AB (ref 3.5–5.1)
SODIUM: 139 mmol/L (ref 135–145)

## 2016-11-27 MED ORDER — POTASSIUM CHLORIDE CRYS ER 20 MEQ PO TBCR
30.0000 meq | EXTENDED_RELEASE_TABLET | Freq: Once | ORAL | Status: AC
  Administered 2016-11-27: 16:00:00 30 meq via ORAL
  Filled 2016-11-27: qty 1

## 2016-11-27 MED ORDER — FUROSEMIDE 10 MG/ML IJ SOLN
60.0000 mg | Freq: Once | INTRAMUSCULAR | Status: AC
  Administered 2016-11-27: 60 mg via INTRAVENOUS
  Filled 2016-11-27: qty 6

## 2016-11-27 MED ORDER — POTASSIUM CHLORIDE CRYS ER 20 MEQ PO TBCR
20.0000 meq | EXTENDED_RELEASE_TABLET | Freq: Once | ORAL | Status: AC
Start: 2016-11-27 — End: 2016-11-27
  Administered 2016-11-27: 20 meq via ORAL
  Filled 2016-11-27: qty 1

## 2016-11-27 NOTE — Progress Notes (Signed)
[facility] PROGRESS NOTE  Chris Stokes AY:[id number] DOB: 06-Apr-1929 DOA: 11/23/2016  PCP: [name], MD  Brief History/Interval Summary: 82 y.o.malewith a Past Medical History of hypothyroidism, HLD, IPF, HTN who presented with SOB. No significant evidence of volume overload, respiratory failure most likely related to progression of IPF, been followed by pulmonary and cardiology, recommendation is for hospice at his assisted living facility.    Reason for Visit: Pulmonary fibrosis and acute diastolic CHF  Consultants: Cardiology. Pulmonology. Palliative medicine  Procedures: None  Antibiotics: Received ceftriaxone and azithromycin in the emergency department  Subjective/Interval History: Patient somewhat distracted this morning. His daughter is at the bedside. He denies any pain.  ROS: Denies any nausea or vomiting  Objective:  Vital Signs  Vitals:   11/27/16 0545 11/27/16 0905 11/27/16 1100 11/27/16 1300  BP: 120/67  104/67 (!) 137/93  Pulse: 64   67  Resp: 19   18  Temp: 98.1 F (36.7 C)  97.8 F (36.6 C) 97.7 F (36.5 C)  TempSrc: Oral  Oral Oral  SpO2: 91% 93%  93%  Weight: 75.6 kg (166 lb 9.6 oz)     Height:        Intake/Output Summary (Last 24 hours) at 11/27/16 1405 Last data filed at 11/27/16 0553  Gross per 24 hour  Intake              360 ml  Output             3100 ml  Net            -2740 ml   Filed Weights   11/25/16 0705 11/26/16 0522 11/27/16 0545  Weight: 80.8 kg (178 lb 1.6 oz) 78.2 kg (172 lb 8 oz) 75.6 kg (166 lb 9.6 oz)    General appearance: alert, cooperative, distracted. appears stated age and no distress Resp: Coarse crackles appreciated bilaterally. No wheezing. No rhonchi. Cardio: regular rate and rhythm, S1, S2 normal, no murmur, click, rub or gallop GI: soft, non-tender; bowel sounds normal; no masses,  no organomegaly Extremities: extremities normal, atraumatic, no cyanosis or edema Neurologic: Awake and alert.  Distracted. No focal deficits.  Lab Results:  Data Reviewed: I have personally reviewed following labs and imaging studies  CBC:  Recent Labs Lab 11/23/16 1140 11/23/16 1700 11/24/16 0407 11/25/16 0447  WBC 6.0 6.8 5.2 7.8  NEUTROABS 4.5  --   --   --   HGB 15.0 16.5 14.7 14.1  HCT 46.4 50.4 44.9 43.5  MCV 103.3* 104.8* 102.7* 103.1*  PLT 77* 81* 71* 76*    Basic Metabolic Panel:  Recent Labs Lab 11/23/16 1140 11/23/16 1700 11/23/16 2236 11/24/16 0407 11/25/16 0447 11/27/16 0412  NA 141  --   --  139 138 139  K 4.4  --   --  4.0 4.0 3.3*  CL 107  --   --  104 102 100*  CO2 26  --   --  29 28 32  GLUCOSE 124*  --   --  130* 90 84  BUN 18  --   --  17 16 12   CREATININE 1.40* 1.20  --  1.21 1.25* 1.03  CALCIUM 9.4  --   --  9.2 9.0 9.2  MG  --   --  1.8  --   --   --   PHOS  --   --  2.5  --   --   --     GFR: Estimated Creatinine Clearance:  54.1 mL/min (by C-G formula based on SCr of 1.03 mg/dL).   Cardiac Enzymes:  Recent Labs Lab 11/23/16 1140 11/23/16 1700 11/23/16 2236 11/24/16 0407  TROPONINI 0.03* 0.03* 0.03* 0.03*     Recent Results (from the past 240 hour(s))  Urine culture     Status: None   Collection Time: 11/23/16  2:25 PM  Result Value Ref Range Status   Specimen Description URINE, CATHETERIZED  Final   Special Requests NONE  Final   Culture NO GROWTH  Final   Report Status 11/24/2016 FINAL  Final  MRSA PCR Screening     Status: None   Collection Time: 11/23/16  4:59 PM  Result Value Ref Range Status   MRSA by PCR NEGATIVE NEGATIVE Final    Comment:        The GeneXpert MRSA Assay (FDA approved for NASAL specimens only), is one component of a comprehensive MRSA colonization surveillance program. It is not intended to diagnose MRSA infection nor to guide or monitor treatment for MRSA infections.   Culture, blood (routine x 2) Call MD if unable to obtain prior to antibiotics being given     Status: None (Preliminary result)     Collection Time: 11/23/16  5:00 PM  Result Value Ref Range Status   Specimen Description BLOOD LEFT ARM  Final   Special Requests BOTTLES DRAWN AEROBIC ONLY 6CC  Final   Culture NO GROWTH 4 DAYS  Final   Report Status PENDING  Incomplete  Culture, blood (routine x 2) Call MD if unable to obtain prior to antibiotics being given     Status: None (Preliminary result)   Collection Time: 11/23/16  5:10 PM  Result Value Ref Range Status   Specimen Description BLOOD LEFT HAND  Final   Special Requests BOTTLES DRAWN AEROBIC ONLY  [place]  Final   Culture NO GROWTH 4 DAYS  Final   Report Status PENDING  Incomplete  Culture, sputum-assessment     Status: None   Collection Time: 11/26/16 11:39 AM  Result Value Ref Range Status   Specimen Description EXPECTORATED SPUTUM  Final   Special Requests NONE  Final   Sputum evaluation   Final    Sputum specimen not acceptable for testing.  Please recollect.   Results Called to: T. FAIRING, RN AT 1400 ON 11/26/16 BY C. JESSUP, MLT.    Report Status 11/26/2016 FINAL  Final      Radiology Studies: No results found.   Medications:  Scheduled: . acidophilus   Oral Daily  . amLODipine  10 mg Oral Daily  . carvedilol  12.5 mg Oral BID WC  . donepezil  10 mg Oral QHS  . folic acid  1 mg Oral Daily  . furosemide  60 mg Intravenous Once  . furosemide  40 mg Oral Daily  . ipratropium-albuterol  3 mL Nebulization TID  . levothyroxine  150 mcg Oral QAC breakfast  . losartan  100 mg Oral Daily  . mouth rinse  15 mL Mouth Rinse BID  . mirabegron ER  50 mg Oral Daily  . omega-3 acid ethyl esters  1 g Oral Daily  . potassium chloride  30 mEq Oral Once  . spironolactone  12.5 mg Oral Daily  . traZODone  25 mg Oral QHS   Continuous:  [id number], morphine, polyvinyl alcohol, technetium TC 48M diethylenetriame-pentaacetic acid  Assessment/Plan:  Principal Problem:   Acute on chronic respiratory failure with hypoxia (HCC) Active Problems:    Hypothyroidism   Hyperlipidemia  with target LDL less than 70; Statin intolerant   Essential hypertension   Coronary artery disease involving native heart without angina pectoris   Pulmonary fibrosis (HCC)   Acute on chronic congestive heart failure (HCC)   AKI (acute kidney injury) (HCC)   Thrombocytopenia (HCC)   Pressure injury of skin   Hypoxia   Shortness of breath   Goals of care, counseling/discussion   Palliative care encounter    Acute on chronic hypoxic respiratory failure Most likely due to pulmonary fibrosis and heart failure. VQ scan negative for PE. Currently on high flow oxygen by nasal cannula. Stable.  Acute on chronic systolic and diastolic congestive heart failure Most recent echo 08/2016 with EF 25-30% with diffuse hypokinesis and grade 1 diastolic dysfunction. Patient is on oral Lasix. Cardiology has been giving patient additional doses of intravenous Lasix. Continue Cozaar, Coreg and Aldactone. Strict ins and outs and daily weights. Replete potassium.  IPF Continue with oxygen. Seen by pulmonology. Palliative medicine consult recommended. Seen by palliative medicine. He is a candidate for hospice services. This will be arranged at the assisted living facility, hopefully in the next few days.  Ascending aortic aneurysm 4.9cm was 4.4 on prior CT. Patient not a candidate for surgical intervention.   Thrombocytopenia Patient's usually baseline on the lower side 120s to 130s. Counts are low but stable. No evidence for bleeding. Continue to monitor periodically.  Hyperlipidemia Continue statin  Essential Hypertension Continue with home medication  Hypothyroidism Continue Synthroid  DVT Prophylaxis: SCDs    Code Status: DO NOT RESUSCITATE  Family Communication: Discussed with the patient and his daughter  Disposition Plan: Management as outlined above. Continue IV diuresis per cardiology. Plan is for discharge to assisted living facility in the next 2-3  days with hospice services.    LOS: 4 days   [facility] Pager [phone] 11/27/2016, 2:05 PM  If 7PM-7AM, please contact night-coverage at [email or web address], password [facility]

## 2016-11-27 NOTE — Progress Notes (Signed)
Palliative:  I come to visit with Chris Stokes and he is up in the chair but sleeping. Daughter Chris Stokes is at his side. I spoke with Chris Stokes who says that he has had a good day and continues to diurese. She says he slept well last night so we will continue trazodone. Plan is to return to [place] with hospice in next few days. Family is anxious to meet with hospice to begin playing - will consult case manager to assist.   Vinie Sill, NP Palliative Medicine Team Pager # [phone] (M-F 8a-5p) Team Phone # [phone] (Nights/Weekends)

## 2016-11-27 NOTE — Evaluation (Signed)
Physical Therapy Evaluation Patient Details Name: Archibald Magnifico MRN: [id number] DOB: 1929-09-24 Today's Date: 11/27/2016   History of Present Illness  Pt is an 82 y/o male admitted secondary to respiratory failure with hypoxia. PMH including but not limited to CKD, CAD s/p STEMI (1984) and CABG x4 (2004), HTN and pulmonary fibrosis.  Clinical Impression  Pt presented supine in bed with HOB elevated, awake and willing to participate in therapy session. Prior to admission, pt mainly used w/c for mobility and required assistance with ADLs. Pt currently requires min guard for safety with bed mobility and min-mod A for transfers. Of note, during transfer to recliner pt's SPO2 decreased to 84% momentarily and then increased to >90% once seated in recliner. Pt was on 10L of O2 via HFNC throughout. Pt would continue to benefit from skilled physical therapy services at this time while admitted to address his below listed limitations in order to improve his overall safety and independence with functional mobility.      Follow Up Recommendations No PT follow up;Other (comment) (plan is to return home with hospice for comfort care)    Equipment Recommendations  None recommended by PT    Recommendations for Other Services       Precautions / Restrictions Precautions Precautions: Fall Precaution Comments: monitor SPO2 Restrictions Weight Bearing Restrictions: No      Mobility  Bed Mobility Overal bed mobility: Needs Assistance Bed Mobility: Sit to Supine       Sit to supine: Min guard;HOB elevated   General bed mobility comments: increased time, use of bed rails and min guard for safety  Transfers Overall transfer level: Needs assistance Equipment used: 1 person hand held assist Transfers: Sit to/from [facility] Sit to Stand: Mod assist Stand pivot transfers: Min assist       General transfer comment: pt required increased time, mod A to rise to full standing from bed  and min A with pivotal movements to recliner  Ambulation/Gait                Stairs            Wheelchair Mobility    Modified Rankin (Stroke Patients Only)       Balance Overall balance assessment: Needs assistance Sitting-balance support: Feet supported;No upper extremity supported Sitting balance-Leahy Scale: Fair     Standing balance support: During functional activity;Bilateral upper extremity supported Standing balance-Leahy Scale: Poor Standing balance comment: pt reliant on bilateral UE supports on therapist                             Pertinent Vitals/Pain Pain Assessment: No/denies pain    Home Living Family/patient expects to be discharged to:: Assisted living (with hospice)               Home Equipment: Wheelchair - manual;Walker - 2 wheels (the w/c is his wife's) Additional Comments: pt planning to return to ALF with hospice    Prior Function Level of Independence: Needs assistance   Gait / Transfers Assistance Needed: patient states he walked with a walker for short distance but relied manly on w/c  ADL's / Homemaking Assistance Needed: pt needed assist with dressing/bathing        Hand Dominance        Extremity/Trunk Assessment   Upper Extremity Assessment Upper Extremity Assessment: Generalized weakness    Lower Extremity Assessment Lower Extremity Assessment: Generalized weakness  Communication   Communication: HOH  Cognition Arousal/Alertness: Awake/alert Behavior During Therapy: WFL for tasks assessed/performed Overall Cognitive Status: Within Functional Limits for tasks assessed                      General Comments      Exercises     Assessment/Plan    PT Assessment Patient needs continued PT services  PT Problem List Decreased strength;Decreased activity tolerance;Decreased balance;Decreased mobility;Decreased coordination;Decreased knowledge of use of DME;Cardiopulmonary status  limiting activity          PT Treatment Interventions DME instruction;Gait training;Therapeutic exercise;Functional mobility training;Therapeutic activities;Balance training;Neuromuscular re-education;Patient/family education    PT Goals (Current goals can be found in the Care Plan section)  Acute Rehab PT Goals Patient Stated Goal: return to ALF PT Goal Formulation: With patient/family Time For Goal Achievement: 12/11/16 Potential to Achieve Goals: Fair    Frequency Min 3X/week   Barriers to discharge        Co-evaluation               End of Session Equipment Utilized During Treatment: Gait belt;Oxygen (10L O2 via HFNC) Activity Tolerance: Patient tolerated treatment well Patient left: in chair;with call bell/phone within reach;with family/visitor present Nurse Communication: Mobility status         Time: 1120-1140 PT Time Calculation (min) (ACUTE ONLY): 20 min   Charges:   PT Evaluation $PT Eval Moderate Complexity: 1 Procedure     PT G CodesClearnce Sorrel Eagan Shifflett 11/27/2016, 1:10 PM Sherie Don, [place], DPT [phone]

## 2016-11-27 NOTE — Progress Notes (Signed)
   Subjective: Breathing is short   Objective: Vitals:   11/26/16 2004 11/27/16 0545 11/27/16 0905 11/27/16 1100  BP: 116/82 120/67  104/67  Pulse: 68 64    Resp: 17 19    Temp: 97.8 F (36.6 C) 98.1 F (36.7 C)  97.8 F (36.6 C)  TempSrc: Oral Oral  Oral  SpO2: 90% 91% 93%   Weight:  166 lb 9.6 oz (75.6 kg)    Height:       Weight change: -11 lb 8 oz (-5.216 kg)  Intake/Output Summary (Last 24 hours) at 11/27/16 1208 Last data filed at 11/27/16 0553  Gross per 24 hour  Intake              480 ml  Output             3100 ml  Net            -2620 ml   Net neg 7.9 L    General: Alert, awake, oriented x3, in no acute distress Neck:  JVP is normal Heart: Regular rate and rhythm, without murmurs, rubs, gallops.  Lungs  Dry rales bilaterally  . Exemities:  No edema.   Neuro: Grossly intact, nonfocal.   Lab Results: Results for orders placed or performed during the hospital encounter of 11/23/16 (from the past 24 hour(s))  Basic metabolic panel     Status: Abnormal   Collection Time: 11/27/16  4:12 AM  Result Value Ref Range   Sodium 139 135 - 145 mmol/L   Potassium 3.3 (L) 3.5 - 5.1 mmol/L   Chloride 100 (L) 101 - 111 mmol/L   CO2 32 22 - 32 mmol/L   Glucose, Bld 84 65 - 99 mg/dL   BUN 12 6 - 20 mg/dL   Creatinine, Ser 1.03 0.61 - 1.24 mg/dL   Calcium 9.2 8.9 - 10.3 mg/dL   GFR calc non Af Amer >60 >60 mL/min   GFR calc Af Amer >60 >60 mL/min   Anion gap 7 5 - 15    Studies/Results: No results found.  Medications:REviewed   @PROBHOSP @  1  Acute on chronic diastolic CHF  Will continue with IV lasix  Follow BUN/Cr    Replete K    2  CAD  Continue medical Rx  3 Pulm fibrosis    4  HTN  BP improved    LOS: 4 days   Dorris Carnes 11/27/2016, 12:08 PM

## 2016-11-28 LAB — CULTURE, BLOOD (ROUTINE X 2)
CULTURE: NO GROWTH
CULTURE: NO GROWTH

## 2016-11-28 NOTE — Progress Notes (Signed)
[facility] PROGRESS NOTE  Chris Stokes AY:[id number] DOB: Dec 31, 1928 DOA: 11/23/2016  PCP: [name], MD  Brief History/Interval Summary: 82 y.o.malewith a Past Medical History of hypothyroidism, HLD, IPF, HTN who presented with SOB. No significant evidence of volume overload, respiratory failure most likely related to progression of IPF, been followed by pulmonary and cardiology, recommendation is for hospice at his assisted living facility.    Reason for Visit: Pulmonary fibrosis and acute diastolic CHF  Consultants: Cardiology. Pulmonology. Palliative medicine  Procedures: None  Antibiotics: Received ceftriaxone and azithromycin in the emergency department  Subjective/Interval History: Patient states that he is feeling better. Denies any complaints. Denies any pain.   ROS: Denies any nausea or vomiting  Objective:  Vital Signs  Vitals:   11/27/16 1522 11/27/16 1934 11/27/16 1944 11/28/16 0432  BP:   114/76 105/62  Pulse:   72 86  Resp:   18 18  Temp:   98 F (36.7 C) 97.9 F (36.6 C)  TempSrc:   Oral Oral  SpO2: 91% 93% 91% 91%  Weight:    68.6 kg (151 lb 3.2 oz)  Height:        Intake/Output Summary (Last 24 hours) at 11/28/16 0923 Last data filed at 11/28/16 0300  Gross per 24 hour  Intake              120 ml  Output             1850 ml  Net            -1730 ml   Filed Weights   11/26/16 0522 11/27/16 0545 11/28/16 0432  Weight: 78.2 kg (172 lb 8 oz) 75.6 kg (166 lb 9.6 oz) 68.6 kg (151 lb 3.2 oz)    General appearance: alert, cooperative, distracted. appears stated age and no distress Resp: Coarse crackles appreciated bilaterally. No wheezing. No rhonchi. Cardio: regular rate and rhythm, S1, S2 normal, no murmur, click, rub or gallop GI: soft, non-tender; bowel sounds normal; no masses,  no organomegaly Neurologic: Awake and alert. Distracted. No focal deficits.  Lab Results:  Data Reviewed: I have personally reviewed following labs  and imaging studies  CBC:  Recent Labs Lab 11/23/16 1140 11/23/16 1700 11/24/16 0407 11/25/16 0447  WBC 6.0 6.8 5.2 7.8  NEUTROABS 4.5  --   --   --   HGB 15.0 16.5 14.7 14.1  HCT 46.4 50.4 44.9 43.5  MCV 103.3* 104.8* 102.7* 103.1*  PLT 77* 81* 71* 76*    Basic Metabolic Panel:  Recent Labs Lab 11/23/16 1140 11/23/16 1700 11/23/16 2236 11/24/16 0407 11/25/16 0447 11/27/16 0412  NA 141  --   --  139 138 139  K 4.4  --   --  4.0 4.0 3.3*  CL 107  --   --  104 102 100*  CO2 26  --   --  29 28 32  GLUCOSE 124*  --   --  130* 90 84  BUN 18  --   --  17 16 12   CREATININE 1.40* 1.20  --  1.21 1.25* 1.03  CALCIUM 9.4  --   --  9.2 9.0 9.2  MG  --   --  1.8  --   --   --   PHOS  --   --  2.5  --   --   --     GFR: Estimated Creatinine Clearance: 50.9 mL/min (by C-G formula based on SCr of 1.03 mg/dL).  Cardiac Enzymes:  Recent Labs Lab 11/23/16 1140 11/23/16 1700 11/23/16 2236 11/24/16 0407  TROPONINI 0.03* 0.03* 0.03* 0.03*     Recent Results (from the past 240 hour(s))  Urine culture     Status: None   Collection Time: 11/23/16  2:25 PM  Result Value Ref Range Status   Specimen Description URINE, CATHETERIZED  Final   Special Requests NONE  Final   Culture NO GROWTH  Final   Report Status 11/24/2016 FINAL  Final  MRSA PCR Screening     Status: None   Collection Time: 11/23/16  4:59 PM  Result Value Ref Range Status   MRSA by PCR NEGATIVE NEGATIVE Final    Comment:        The GeneXpert MRSA Assay (FDA approved for NASAL specimens only), is one component of a comprehensive MRSA colonization surveillance program. It is not intended to diagnose MRSA infection nor to guide or monitor treatment for MRSA infections.   Culture, blood (routine x 2) Call MD if unable to obtain prior to antibiotics being given     Status: None (Preliminary result)   Collection Time: 11/23/16  5:00 PM  Result Value Ref Range Status   Specimen Description BLOOD LEFT ARM   Final   Special Requests BOTTLES DRAWN AEROBIC ONLY 6CC  Final   Culture NO GROWTH 4 DAYS  Final   Report Status PENDING  Incomplete  Culture, blood (routine x 2) Call MD if unable to obtain prior to antibiotics being given     Status: None (Preliminary result)   Collection Time: 11/23/16  5:10 PM  Result Value Ref Range Status   Specimen Description BLOOD LEFT HAND  Final   Special Requests BOTTLES DRAWN AEROBIC ONLY  [place]  Final   Culture NO GROWTH 4 DAYS  Final   Report Status PENDING  Incomplete  Culture, sputum-assessment     Status: None   Collection Time: 11/26/16 11:39 AM  Result Value Ref Range Status   Specimen Description EXPECTORATED SPUTUM  Final   Special Requests NONE  Final   Sputum evaluation   Final    Sputum specimen not acceptable for testing.  Please recollect.   Results Called to: T. FAIRING, RN AT 1400 ON 11/26/16 BY C. JESSUP, MLT.    Report Status 11/26/2016 FINAL  Final      Radiology Studies: No results found.   Medications:  Scheduled: . acidophilus   Oral Daily  . amLODipine  10 mg Oral Daily  . carvedilol  12.5 mg Oral BID WC  . donepezil  10 mg Oral QHS  . folic acid  1 mg Oral Daily  . furosemide  40 mg Oral Daily  . ipratropium-albuterol  3 mL Nebulization TID  . levothyroxine  150 mcg Oral QAC breakfast  . losartan  100 mg Oral Daily  . mouth rinse  15 mL Mouth Rinse BID  . mirabegron ER  50 mg Oral Daily  . omega-3 acid ethyl esters  1 g Oral Daily  . spironolactone  12.5 mg Oral Daily  . traZODone  25 mg Oral QHS   Continuous:  [id number], morphine, polyvinyl alcohol, technetium TC 67M diethylenetriame-pentaacetic acid  Assessment/Plan:  Principal Problem:   Acute on chronic respiratory failure with hypoxia (HCC) Active Problems:   Hypothyroidism   Hyperlipidemia with target LDL less than 70; Statin intolerant   Essential hypertension   Coronary artery disease involving native heart without angina pectoris   Pulmonary  fibrosis (HCC)   Acute  on chronic congestive heart failure (HCC)   AKI (acute kidney injury) (HCC)   Thrombocytopenia (HCC)   Pressure injury of skin   Hypoxia   Shortness of breath   Goals of care, counseling/discussion   Palliative care encounter    Acute on chronic hypoxic respiratory failure Most likely due to pulmonary fibrosis and heart failure. VQ scan negative for PE. Currently on high flow oxygen by nasal cannula. Stable.  Acute on chronic systolic and diastolic congestive heart failure Most recent echo 08/2016 with EF 25-30% with diffuse hypokinesis and grade 1 diastolic dysfunction. Patient is on oral Lasix. Cardiology has been giving patient additional doses of intravenous Lasix. Patient appears to be euvolemic this morning. No additional doses of Lasix ordered by cardiology. Continue Cozaar, Coreg and Aldactone. Strict ins and outs and daily weights. Replete potassium.  IPF Continue with oxygen. Seen by pulmonology. Palliative medicine consult recommended. Seen by palliative medicine. He is a candidate for hospice services. This will be arranged at the assisted living facility, hopefully in the next few days.  Ascending aortic aneurysm 4.9cm was 4.4 on prior CT. Patient not a candidate for surgical intervention.   Thrombocytopenia Patient's usually baseline on the lower side 120s to 130s. Counts are low but stable. No evidence for bleeding. Continue to monitor periodically.  Hyperlipidemia Continue statin  Essential Hypertension Continue with home medication  Hypothyroidism Continue Synthroid  DVT Prophylaxis: SCDs    Code Status: DO NOT RESUSCITATE  Family Communication: Discussed with the patient and his daughter  Disposition Plan: Management as outlined above. Medically appears to be stable. Could return to assisted living facility with hospice services once it has been arranged.     LOS: 5 days   [facility] Pager  [phone] 11/28/2016, 9:23 AM  If 7PM-7AM, please contact night-coverage at [email or web address], password [facility]

## 2016-11-29 LAB — BASIC METABOLIC PANEL
ANION GAP: 8 (ref 5–15)
BUN: 19 mg/dL (ref 6–20)
CO2: 29 mmol/L (ref 22–32)
Calcium: 9.5 mg/dL (ref 8.9–10.3)
Chloride: 100 mmol/L — ABNORMAL LOW (ref 101–111)
Creatinine, Ser: 1.2 mg/dL (ref 0.61–1.24)
GFR, EST NON AFRICAN AMERICAN: 53 mL/min — AB (ref >60)
Glucose, Bld: 97 mg/dL (ref 65–99)
POTASSIUM: 3.9 mmol/L (ref 3.5–5.1)
SODIUM: 137 mmol/L (ref 135–145)

## 2016-11-29 NOTE — NC FL2 (Signed)
[place] LEVEL OF CARE SCREENING TOOL     IDENTIFICATION  Patient Name: Chris Stokes Birthdate: [birth year] Sex: male Admission Date (Current Location): 11/23/2016  [facility] and [place] Number:  Herbalist and Address:  [facility]. [facility], [place] [address] [place], [place] [address]      Provider Number: [id number]  Attending Physician Name and Address:  Bonnielee Haff, MD  Relative Name and Phone Number:       Current Level of Care: Hospital Recommended Level of Care: [facility] (with hospice) Prior Approval Number:    Date Approved/Denied:   PASRR Number:    Discharge Plan: Other (Comment) (ALF with hospice)    Current Diagnoses: Patient Active Problem List   Diagnosis Date Noted  . Hypoxia   . Shortness of breath   . Goals of care, counseling/discussion   . Palliative care encounter   . Acute on chronic respiratory failure with hypoxia ([place]) 11/23/2016  . Acute on chronic congestive heart failure ([place]) 11/23/2016  . AKI (acute kidney injury) ([place]) 11/23/2016  . Thrombocytopenia ([place]) 11/23/2016  . Pressure injury of skin 11/23/2016  . Impaired mobility 11/01/2016  . Chronic combined systolic and diastolic heart failure, NYHA class 3 ([place]) 10/27/2016  . DOE (dyspnea on exertion) 09/15/2016  . Cardiomyopathy, ischemic 09/15/2016  . Advance directive indicates patient wish for do-not-resuscitate status 09/15/2016  . Abnormal nuclear stress test 09/01/2016  . Elevated LFTs 08/05/2016  . Coronary artery disease, occlusive   . Diarrhea 03/15/2016  . Well adult exam 01/27/2016  . Gait disorder 10/28/2015  . Lumbar radiculopathy 07/30/2015  . Chronic respiratory failure with hypoxia ([place]) 06/04/2015  . Greater trochanteric bursitis of right hip 08/20/2014  . Low back pain 07/30/2014  . Acute right hip pain 07/23/2014  . Urinary incontinence 07/23/2014  . Neuropathy ([place]) 11/27/2013  . Dementia 02/06/2013  .  Action tremor 01/04/2013  . Pulmonary fibrosis ([place]) 11/07/2012  . Ischemic heart disease 07/20/2011  . HYPERKALEMIA 07/16/2010  . HYPERGLYCEMIA 07/16/2010  . HAND PAIN 10/31/2009  . TOBACCO USE, QUIT 10/31/2009  . GERD 08/27/2009  . HEMATOCHEZIA 10/29/2008  . COLONIC POLYPS 03/30/2008  . ABDOMINAL AORTIC ANEURYSM 03/30/2008  . Ulcerative colitis ([place]) 03/30/2008  . NEPHROLITHIASIS, HX OF 03/30/2008  . Hypothyroidism 01/18/2008  . Hyperlipidemia with target LDL less than 70; Statin intolerant 01/18/2008  . Essential hypertension 01/18/2008  . OTHER SPECIFIED ACQUIRED HYPOTHYROIDISM 01/12/2008  . Coronary artery disease involving native heart without angina pectoris 01/12/2008  . Bladder neck obstruction 01/12/2008  . OTHER DRUG ALLERGY 01/12/2008    Orientation RESPIRATION BLADDER Height & Weight     Self, Time, Situation, Place  O2 (Nasal Canula 8 L) Incontinent, External catheter Weight: 151 lb 3.2 oz (68.6 kg) Height:  5\' 10"  (177.8 cm)  BEHAVIORAL SYMPTOMS/MOOD NEUROLOGICAL BOWEL NUTRITION STATUS   (None)  (Dementia) Continent Diet (Heart healthy)  AMBULATORY STATUS COMMUNICATION OF NEEDS Skin   Limited Assist Verbally Other (Comment) (Pressure injury stage 1: Mid sacrum (foam prn))                       Personal Care Assistance Level of Assistance  Bathing, Feeding, Dressing Bathing Assistance: Maximum assistance Feeding assistance: Independent Dressing Assistance: Maximum assistance     Functional Limitations Info  Sight, Hearing, Speech Sight Info: Adequate Hearing Info: Adequate Speech Info: Adequate    SPECIAL CARE FACTORS FREQUENCY  Blood pressure  Contractures Contractures Info: Not present    Additional Factors Info  Code Status, Allergies Code Status Info: DNR Allergies Info: Lipitor (Atorvastatin), Ramipril, Simvastatin, Sulfonamide Derivatives           Current Medications (11/29/2016):  This is the current  hospital active medication list Current Facility-Administered Medications  Medication Dose Route Frequency Provider Last Rate Last Dose  . acidophilus (RISAQUAD) capsule   Oral Daily Elwin Mocha, MD   1 capsule at 11/29/16 0946  . albuterol (PROVENTIL) (2.5 MG/3ML) 0.083% nebulizer solution 2.5 mg  2.5 mg Nebulization Q2H PRN Elwin Mocha, MD      . amLODipine (NORVASC) tablet 10 mg  10 mg Oral Daily Elwin Mocha, MD   10 mg at 11/29/16 0947  . carvedilol (COREG) tablet 12.5 mg  12.5 mg Oral BID WC Albertine Patricia, MD   12.5 mg at 11/29/16 0559  . donepezil (ARICEPT) tablet 10 mg  10 mg Oral QHS Albertine Patricia, MD   10 mg at 11/28/16 2207  . folic acid (FOLVITE) tablet 1 mg  1 mg Oral Daily Elwin Mocha, MD   1 mg at 11/29/16 0946  . furosemide (LASIX) tablet 40 mg  40 mg Oral Daily Albertine Patricia, MD   40 mg at 11/29/16 0947  . levothyroxine (SYNTHROID, LEVOTHROID) tablet 150 mcg  150 mcg Oral QAC breakfast Elwin Mocha, MD   150 mcg at 11/29/16 0559  . losartan (COZAAR) tablet 100 mg  100 mg Oral Daily Albertine Patricia, MD   100 mg at 11/29/16 0947  . MEDLINE mouth rinse  15 mL Mouth Rinse BID Elwin Mocha, MD   15 mL at 11/29/16 0947  . mirabegron ER (MYRBETRIQ) tablet 50 mg  50 mg Oral Daily Elwin Mocha, MD   50 mg at 11/29/16 0946  . morphine 10 MG/5ML solution 2 mg  2 mg Oral [id number] PRN Pershing Proud, NP      . omega-3 acid ethyl esters (LOVAZA) capsule 1 g  1 g Oral Daily Elwin Mocha, MD   1 g at 11/29/16 0947  . polyvinyl alcohol (LIQUIFILM TEARS) 1.4 % ophthalmic solution 1-2 drop  1-2 drop Both Eyes TID PRN Elwin Mocha, MD      . spironolactone (ALDACTONE) tablet 12.5 mg  12.5 mg Oral Daily Albertine Patricia, MD   12.5 mg at 11/29/16 0946  . technetium TC 71M diethylenetriame-pentaacetic acid (DTPA) injection 123456 millicurie  123456 millicurie Intravenous Once PRN Kalman Jewels, MD      . traZODone (DESYREL) tablet 25 mg  25 mg Oral QHS Pershing Proud, NP   25 mg at 11/28/16 2207     Discharge Medications: Please see discharge summary for a list of discharge medications.  Relevant Imaging Results:  Relevant Lab Results:   Additional Information SS#: [id number]. Needs hospice services.  Candie Chroman, LCSW

## 2016-11-29 NOTE — Progress Notes (Signed)
Patient Name: Chris Stokes Date of Encounter: 11/29/2016     Principal Problem:   Acute on chronic respiratory failure with hypoxia ([place]) Active Problems:   Hypothyroidism   Hyperlipidemia with target LDL less than 70; Statin intolerant   Essential hypertension   Coronary artery disease involving native heart without angina pectoris   Pulmonary fibrosis (HCC)   Acute on chronic congestive heart failure (HCC)   AKI (acute kidney injury) (HCC)   Thrombocytopenia (HCC)   Pressure injury of skin   Hypoxia   Shortness of breath   Goals of care, counseling/discussion   Palliative care encounter    SUBJECTIVE  No chest pain. Dyspnea back to baseline. Note plans for dc back to assisted living/with hospice care.  CURRENT MEDS . acidophilus   Oral Daily  . amLODipine  10 mg Oral Daily  . carvedilol  12.5 mg Oral BID WC  . donepezil  10 mg Oral QHS  . folic acid  1 mg Oral Daily  . furosemide  40 mg Oral Daily  . levothyroxine  150 mcg Oral QAC breakfast  . losartan  100 mg Oral Daily  . mouth rinse  15 mL Mouth Rinse BID  . mirabegron ER  50 mg Oral Daily  . omega-3 acid ethyl esters  1 g Oral Daily  . spironolactone  12.5 mg Oral Daily  . traZODone  25 mg Oral QHS    OBJECTIVE  Vitals:   11/28/16 1300 11/28/16 1621 11/28/16 2142 11/29/16 0419  BP: 96/65 115/68 115/73 119/71  Pulse: 64  64 85  Resp: 20  19 18   Temp: 97.7 F (36.5 C)  97.9 F (36.6 C) 97.8 F (36.6 C)  TempSrc: Oral  Oral Oral  SpO2: 95%  94% (!) 89%  Weight:      Height:        Intake/Output Summary (Last 24 hours) at 11/29/16 1023 Last data filed at 11/29/16 0558  Gross per 24 hour  Intake              480 ml  Output             1100 ml  Net             -620 ml   Filed Weights   11/26/16 0522 11/27/16 0545 11/28/16 0432  Weight: 172 lb 8 oz (78.2 kg) 166 lb 9.6 oz (75.6 kg) 151 lb 3.2 oz (68.6 kg)    PHYSICAL EXAM  General: Pleasant, elderly man, NAD. Neuro: Alert and oriented X 3.  Moves all extremities spontaneously. Psych: Normal affect. HEENT:  Normal  Neck: Supple without bruits or JVD. Lungs:  Resp regular and unlabored, diffuse fine rales throughout Heart: RRR no s3, s4, or murmurs. Abdomen: Soft, non-tender, non-distended, BS + x 4.  Extremities: No clubbing, cyanosis or edema. DP/PT/Radials 2+ and equal bilaterally.  Accessory Clinical Findings  CBC No results for input(s): WBC, NEUTROABS, HGB, HCT, MCV, PLT in the last 72 hours. Basic Metabolic Panel  Recent Labs  11/27/16 0412 11/29/16 0455  NA 139 137  K 3.3* 3.9  CL 100* 100*  CO2 32 29  GLUCOSE 84 97  BUN 12 19  CREATININE 1.03 1.20  CALCIUM 9.2 9.5   Liver Function Tests No results for input(s): AST, ALT, ALKPHOS, BILITOT, PROT, ALBUMIN in the last 72 hours. No results for input(s): LIPASE, AMYLASE in the last 72 hours. Cardiac Enzymes No results for input(s): CKTOTAL, CKMB, CKMBINDEX, TROPONINI in the last  72 hours. BNP Invalid input(s): POCBNP D-Dimer No results for input(s): DDIMER in the last 72 hours. Hemoglobin A1C No results for input(s): HGBA1C in the last 72 hours. Fasting Lipid Panel No results for input(s): CHOL, HDL, LDLCALC, TRIG, CHOLHDL, LDLDIRECT in the last 72 hours. Thyroid Function Tests No results for input(s): TSH, T4TOTAL, T3FREE, THYROIDAB in the last 72 hours.  Invalid input(s): FREET3  TELE  nsr  Radiology/Studies  Dg Chest 2 View  Result Date: 11/23/2016 CLINICAL DATA:  Short of breath and cough. History of pulmonary fibrosis. EXAM: CHEST  2 VIEW COMPARISON:  07/16/2016 FINDINGS: Prior median sternotomy. Midline trachea. Moderate cardiomegaly. Right superior mediastinal soft tissue fullness is present back to 06/03/2014 and is likely attributed to tortuous aorta and possibly mild adenopathy. Bilateral pleural thickening or trace pleural fluid. No pneumothorax. Severe interstitial lung disease is lower lobe and peripheral predominant. Given this  underlying chronic finding, there is no convincing evidence of superimposed acute pneumonia or overt congestive failure. Apparent increase in interstitial prominence is felt to be due to AP portable technique and resultant decreased inspiratory effort. IMPRESSION: Interstitial lung disease, consistent with the history of pulmonary fibrosis. No convincing evidence of acute superimposed process. Cardiomegaly.  No overt congestive failure. Electronically Signed   By: Abigail Miyamoto M.D.   On: 11/23/2016 12:14   Ct Chest High Resolution  Result Date: 11/24/2016 CLINICAL DATA:  83 year old male with history of idiopathic pulmonary fibrosis. Follow-up study. EXAM: CT CHEST WITHOUT CONTRAST TECHNIQUE: Multidetector CT imaging of the chest was performed following the standard protocol without intravenous contrast. High resolution imaging of the lungs, as well as inspiratory and expiratory imaging, was performed. COMPARISON:  Chest CT 11/10/2012. FINDINGS: Cardiovascular: Heart size is enlarged with severe right atrial dilatation. There is no significant pericardial fluid, thickening or pericardial calcification. There is aortic atherosclerosis, as well as atherosclerosis of the great vessels of the mediastinum and the coronary arteries, including calcified atherosclerotic plaque in the left main, left anterior descending, left circumflex and right coronary arteries. Status post median sternotomy for CABG, including LIMA to the LAD. Calcifications of the aortic valve. Aneurysmal dilatation of ascending thoracic aorta which measures up to 4.9 cm in diameter. Ectasia of the aortic arch which measures up to 4.1 cm in diameter distally. Mediastinum/Nodes: Multiple borderline enlarged and enlarged mediastinal and bilateral hilar lymph nodes measuring up to 2 cm in short axis in the low right paratracheal nodal station. Esophagus is unremarkable in appearance. No axillary lymphadenopathy. Lungs/Pleura: High-resolution images  demonstrate widespread subpleural reticulation, parenchymal banding, traction bronchiectasis and honeycombing, all of which demonstrates a craniocaudal gradient. These findings have progressed compared to prior study from 11/10/2012, and are considered diagnostic from an imaging standpoint of usual interstitial pneumonia (UIP). Inspiratory and expiratory imaging is unremarkable. Mild diffuse bronchial wall thickening with mild to moderate centrilobular and paraseptal emphysema noted predominantly in the lung apices. Small bilateral pleural effusions lying dependently. No acute consolidative airspace disease. Upper Abdomen: Aortic atherosclerosis. Incompletely visualized low-attenuation lesion measuring at least 3.3 cm in the upper pole of left kidney, slightly larger than remote prior study from 2013, presumably a cyst. Multiple small calcified granulomas are noted in the liver. Musculoskeletal: Median sternotomy wires. There are no aggressive appearing lytic or blastic lesions noted in the visualized portions of the skeleton. IMPRESSION: 1. The appearance of the lungs is compatible with interstitial lung disease, and findings are considered diagnostic of usual interstitial pneumonia (UIP). 2. There is also mild diffuse bronchial wall thickening  with mild to moderate centrilobular and paraseptal emphysema noted predominantly in the lung apices. 3. Cardiomegaly with severe right atrial dilatation. 4. Aortic atherosclerosis, in addition to left main and 3 vessel coronary artery disease. Status post median sternotomy for CABG, including LIMA to the LAD. 5. There are calcifications of the aortic valve. Echocardiographic correlation for evaluation of potential valvular dysfunction may be warranted if clinically indicated. 6. Aneurysmal dilatation of the ascending thoracic aorta (4.9 cm in diameter). Ascending thoracic aortic aneurysm. Recommend semi-annual imaging followup by CTA or MRA and referral to cardiothoracic  surgery if not already obtained. This recommendation follows 2010 ACCF/AHA/AATS/ACR/ASA/SCA/SCAI/SIR/STS/SVM Guidelines for the Diagnosis and Management of Patients With Thoracic Aortic Disease. Circulation. 2010; 121: [id number]. 7. Additional incidental findings, as above. Electronically Signed   By: Vinnie Langton M.D.   On: 11/24/2016 16:13   Nm Pulmonary Perf And Vent  Result Date: 11/24/2016 CLINICAL DATA:  Chronic respiratory failure through severe hypoxia. Congestive heart failure. EXAM: NUCLEAR MEDICINE VENTILATION - PERFUSION LUNG SCAN TECHNIQUE: Ventilation images were obtained in multiple projections using inhaled aerosol Tc-10m DTPA. Perfusion images were obtained in multiple projections after intravenous injection of Tc-46m MAA. RADIOPHARMACEUTICALS:  31.3 mCi Technetium-73m DTPA aerosol inhalation and 4.2 mCi Technetium-5m MAA IV COMPARISON:  Focal full FINDINGS: Ventilation: No significant focal ventilation defect. Perfusion: No wedge shaped peripheral perfusion defects to suggest acute pulmonary embolism. IMPRESSION: No evidence suggestive recent pulmonary embolism. Electronically Signed   By: Lorriane Shire M.D.   On: 11/24/2016 10:11    ASSESSMENT AND PLAN  1. Acute on chronic systolic heart failure - he will continue beta blocker and ARB. Lasix as needed - currently on 40 mg daily. 2. Pulmonary fibrosis - CT scan reviewed demonstrating progression. Note plans for hospice. 3. CAD - no anginal symptoms.  4. Disp. - from cardiology perspective, ok for transfer back to assisted care facility.  Carleene Overlie Luretta Everly,M.D.  11/29/2016 10:23 AMPatient ID: Alda Lea, male   DOB: 02-10-1929, 83 y.o.   MRN: [id number]

## 2016-11-29 NOTE — Clinical Social Work Note (Signed)
CSW spoke with med tech at [facility]. She stated that both staff and residents have the Norovirus. Patient's wife has it as well and he shares a room with her. Staff are closing residents' room doors, shutting down activities, the dining hall, etc. Staff want MD to determine if it is safe to send patient back at this time. They are aware that patient will return with hospice and do not want him to get worse.  Dayton Scrape, [place]

## 2016-11-29 NOTE — Clinical Social Work Note (Signed)
Clinical Social Work Assessment  Patient Details  Name: Chris Stokes MRN: [id number] Date of Birth: Jan 14, 1929  Date of referral:  11/29/16               Reason for consult:  Discharge Planning, End of Life/Hospice                Permission sought to share information with:  Facility Sport and exercise psychologist, Family Supports Permission granted to share information::  Yes, Verbal Permission Granted  Name::     Marvetta Gibbons  Agency::  [facility]  Relationship::  Daughter  Contact Information:  [phone]  Housing/Transportation Living arrangements for the past 2 months:  [place] of Information:  Patient, Medical Team, Adult Children Patient Interpreter Needed:  None Criminal Activity/Legal Involvement Pertinent to Current Situation/Hospitalization:  No - Comment as needed Significant Relationships:  Adult Children, Spouse Lives with:  Facility Resident Do you feel safe going back to the place where you live?  Yes Need for family participation in patient care:  Yes (Comment)  Care giving concerns:  Patient is from [place]. Needs hospice services at facility.   Social Worker assessment / plan:  CSW met with patient. Daughter, [name], at bedside. CSW introduced role and explained that discharge planning would be discussed. Patient is from [place]. His wife is there also but is nonverbal. Patient and his daughter agreeable to return with hospice services but are concerned about recent Norovirus outbreak at facility. Patient's daughter went by facility yesterday and residents are in their rooms with the doors closed. Meals are being brought to them in their rooms. No further protocol known. Patient's daughter wants to know if there is a risk to patient returning during this outbreak. CSW called and left voicemail for med tech at facility. Per RN, patient will likely discharge tomorrow. Patient and his daughter aware. No further concerns. CSW  encouraged patient and his daughter to contact CSW as needed. CSW will continue to follow patient and his daughter for support and facilitate discharge back to [facility] once medically stable, likely tomorrow.  Employment status:  Retired Forensic scientist:  Other (Comment Required) ([employer]) PT Recommendations:  No Follow Up Information / Referral to community resources:  Other (Comment Required) (Will return to ALF.)  Patient/Family's Response to care:  Patient and his daughter agreeable to return to ALF with hospice services. Patient's family supportive and involved in patient's care. Patient and his daughter appreciated social work intervention.  Patient/Family's Understanding of and Emotional Response to Diagnosis, Current Treatment, and Prognosis:  Patient and his daughter understand and are agreeable to discharge plan, pending protocols to avoid Norovirus at ALF. Patient and his daughter appear happy with hospital care.  Emotional Assessment Appearance:  Appears stated age Attitude/Demeanor/Rapport:  Other (Pleasant) Affect (typically observed):  Accepting, Appropriate, Calm, Pleasant Orientation:  Oriented to Self, Oriented to Place, Oriented to  Time, Oriented to Situation Alcohol / Substance use:  Tobacco Use Psych involvement (Current and /or in the community):  No (Comment)  Discharge Needs  Concerns to be addressed:  Care Coordination Readmission within the last 30 days:  No Current discharge risk:  Dependent with Mobility, Terminally ill Barriers to Discharge:  Continued Medical Work up, Other (Norovirus outbreak at ALF. )   Candie Chroman, LCSW 11/29/2016, 10:01 AM

## 2016-11-29 NOTE — Progress Notes (Signed)
[facility] PROGRESS NOTE  Chris Stokes AY:[id number] DOB: September 02, 1929 DOA: 11/23/2016  PCP: [name], MD  Brief History/Interval Summary: 83 y.o.malewith a Past Medical History of hypothyroidism, HLD, IPF, HTN who presented with SOB. No significant evidence of volume overload, respiratory failure most likely related to progression of IPF, been followed by pulmonary and cardiology, recommendation is for hospice at his assisted living facility.    Reason for Visit: Pulmonary fibrosis and acute diastolic CHF  Consultants: Cardiology. Pulmonology. Palliative medicine  Procedures: None  Antibiotics: Received ceftriaxone and azithromycin in the emergency department  Subjective/Interval History: Patient feels well. Denies any complaints.  ROS: Denies any nausea or vomiting  Objective:  Vital Signs  Vitals:   11/28/16 1300 11/28/16 1621 11/28/16 2142 11/29/16 0419  BP: 96/65 115/68 115/73 119/71  Pulse: 64  64 85  Resp: 20  19 18   Temp: 97.7 F (36.5 C)  97.9 F (36.6 C) 97.8 F (36.6 C)  TempSrc: Oral  Oral Oral  SpO2: 95%  94% (!) 89%  Weight:      Height:        Intake/Output Summary (Last 24 hours) at 11/29/16 0847 Last data filed at 11/29/16 0558  Gross per 24 hour  Intake              720 ml  Output             1300 ml  Net             -580 ml   Filed Weights   11/26/16 0522 11/27/16 0545 11/28/16 0432  Weight: 78.2 kg (172 lb 8 oz) 75.6 kg (166 lb 9.6 oz) 68.6 kg (151 lb 3.2 oz)    General appearance: alert, cooperative, distracted. appears stated age and no distress Resp: Coarse crackles appreciated bilaterally. No wheezing. No rhonchi. Cardio: regular rate and rhythm, S1, S2 normal, no murmur, click, rub or gallop GI: soft, non-tender; bowel sounds normal; no masses,  no organomegaly Neurologic: Awake and alert. Distracted. No focal deficits.  Lab Results:  Data Reviewed: I have personally reviewed following labs and imaging  studies  CBC:  Recent Labs Lab 11/23/16 1140 11/23/16 1700 11/24/16 0407 11/25/16 0447  WBC 6.0 6.8 5.2 7.8  NEUTROABS 4.5  --   --   --   HGB 15.0 16.5 14.7 14.1  HCT 46.4 50.4 44.9 43.5  MCV 103.3* 104.8* 102.7* 103.1*  PLT 77* 81* 71* 76*    Basic Metabolic Panel:  Recent Labs Lab 11/23/16 1140 11/23/16 1700 11/23/16 2236 11/24/16 0407 11/25/16 0447 11/27/16 0412 11/29/16 0455  NA 141  --   --  139 138 139 137  K 4.4  --   --  4.0 4.0 3.3* 3.9  CL 107  --   --  104 102 100* 100*  CO2 26  --   --  29 28 32 29  GLUCOSE 124*  --   --  130* 90 84 97  BUN 18  --   --  17 16 12 19   CREATININE 1.40* 1.20  --  1.21 1.25* 1.03 1.20  CALCIUM 9.4  --   --  9.2 9.0 9.2 9.5  MG  --   --  1.8  --   --   --   --   PHOS  --   --  2.5  --   --   --   --     GFR: Estimated Creatinine Clearance: 43.7 mL/min (by C-G formula  based on SCr of 1.2 mg/dL).   Cardiac Enzymes:  Recent Labs Lab 11/23/16 1140 11/23/16 1700 11/23/16 2236 11/24/16 0407  TROPONINI 0.03* 0.03* 0.03* 0.03*     Recent Results (from the past 240 hour(s))  Urine culture     Status: None   Collection Time: 11/23/16  2:25 PM  Result Value Ref Range Status   Specimen Description URINE, CATHETERIZED  Final   Special Requests NONE  Final   Culture NO GROWTH  Final   Report Status 11/24/2016 FINAL  Final  MRSA PCR Screening     Status: None   Collection Time: 11/23/16  4:59 PM  Result Value Ref Range Status   MRSA by PCR NEGATIVE NEGATIVE Final    Comment:        The GeneXpert MRSA Assay (FDA approved for NASAL specimens only), is one component of a comprehensive MRSA colonization surveillance program. It is not intended to diagnose MRSA infection nor to guide or monitor treatment for MRSA infections.   Culture, blood (routine x 2) Call MD if unable to obtain prior to antibiotics being given     Status: None   Collection Time: 11/23/16  5:00 PM  Result Value Ref Range Status   Specimen  Description BLOOD LEFT ARM  Final   Special Requests BOTTLES DRAWN AEROBIC ONLY [place]  Final   Culture NO GROWTH 5 DAYS  Final   Report Status 11/28/2016 FINAL  Final  Culture, blood (routine x 2) Call MD if unable to obtain prior to antibiotics being given     Status: None   Collection Time: 11/23/16  5:10 PM  Result Value Ref Range Status   Specimen Description BLOOD LEFT HAND  Final   Special Requests BOTTLES DRAWN AEROBIC ONLY  [place]  Final   Culture NO GROWTH 5 DAYS  Final   Report Status 11/28/2016 FINAL  Final  Culture, sputum-assessment     Status: None   Collection Time: 11/26/16 11:39 AM  Result Value Ref Range Status   Specimen Description EXPECTORATED SPUTUM  Final   Special Requests NONE  Final   Sputum evaluation   Final    Sputum specimen not acceptable for testing.  Please recollect.   Results Called to: T. FAIRING, RN AT 1400 ON 11/26/16 BY C. JESSUP, MLT.    Report Status 11/26/2016 FINAL  Final      Radiology Studies: No results found.   Medications:  Scheduled: . acidophilus   Oral Daily  . amLODipine  10 mg Oral Daily  . carvedilol  12.5 mg Oral BID WC  . donepezil  10 mg Oral QHS  . folic acid  1 mg Oral Daily  . furosemide  40 mg Oral Daily  . levothyroxine  150 mcg Oral QAC breakfast  . losartan  100 mg Oral Daily  . mouth rinse  15 mL Mouth Rinse BID  . mirabegron ER  50 mg Oral Daily  . omega-3 acid ethyl esters  1 g Oral Daily  . spironolactone  12.5 mg Oral Daily  . traZODone  25 mg Oral QHS   Continuous:  [id number], morphine, polyvinyl alcohol, technetium TC 15M diethylenetriame-pentaacetic acid  Assessment/Plan:  Principal Problem:   Acute on chronic respiratory failure with hypoxia (HCC) Active Problems:   Hypothyroidism   Hyperlipidemia with target LDL less than 70; Statin intolerant   Essential hypertension   Coronary artery disease involving native heart without angina pectoris   Pulmonary fibrosis (HCC)   Acute on chronic  congestive heart failure (HCC)   AKI (acute kidney injury) ([place])   Thrombocytopenia (HCC)   Pressure injury of skin   Hypoxia   Shortness of breath   Goals of care, counseling/discussion   Palliative care encounter    Acute on chronic hypoxic respiratory failure Most likely due to pulmonary fibrosis and heart failure. VQ scan negative for PE. Currently on high flow oxygen by nasal cannula. Stable.  Acute on chronic systolic and diastolic congestive heart failure Most recent echo 08/2016 with EF 25-30% with diffuse hypokinesis and grade 1 diastolic dysfunction. Patient is on oral Lasix. Additionally he has received a few doses of intravenous Lasix. None in the last 2 days. Appears to be euvolemic at this time. Continue Cozaar, Coreg and Aldactone. Strict ins and outs and daily weights. Repleted potassium.  IPF Continue with oxygen. Seen by pulmonology. Palliative medicine consult recommended. Seen by palliative medicine. He is a candidate for hospice services. This will be arranged at the assisted living facility. Anticipate discharge tomorrow.  Ascending aortic aneurysm 4.9cm was 4.4 on prior CT. Patient not a candidate for surgical intervention.   Thrombocytopenia Patient's usually baseline on the lower side 120s to 130s. Counts are low but stable. No evidence for bleeding. Continue to monitor periodically.  Hyperlipidemia Continue statin  Essential Hypertension Continue with home medication  Hypothyroidism Continue Synthroid  DVT Prophylaxis: SCDs    Code Status: DO NOT RESUSCITATE  Family Communication: Discussed with the patient Disposition Plan: Management as outlined above. Medically appears to be stable. Could return to assisted living facility with hospice services once it has been arranged. Anticipate discharge tomorrow.    LOS: 6 days   [facility] Pager [phone] 11/29/2016, 8:47 AM  If 7PM-7AM, please contact night-coverage at  [email or web address], password [facility]

## 2016-11-30 MED ORDER — LOSARTAN POTASSIUM 100 MG PO TABS: 100.0000 mg | ORAL_TABLET | Freq: Every day | ORAL | 0 refills | Status: AC

## 2016-11-30 MED ORDER — CARVEDILOL 12.5 MG PO TABS: 12.5000 mg | ORAL_TABLET | Freq: Two times a day (BID) | ORAL | 0 refills | Status: AC

## 2016-11-30 MED ORDER — MIRABEGRON ER 50 MG PO TB24: 50.0000 mg | ORAL_TABLET | Freq: Every day | ORAL | 0 refills | Status: AC

## 2016-11-30 MED ORDER — PRIMIDONE 50 MG PO TABS: ORAL_TABLET | ORAL | 0 refills | Status: AC

## 2016-11-30 MED ORDER — AMLODIPINE BESYLATE 10 MG PO TABS: 10.0000 mg | ORAL_TABLET | Freq: Every day | ORAL | 0 refills | Status: AC

## 2016-11-30 MED ORDER — MESALAMINE 800 MG PO TBEC: 2.0000 | DELAYED_RELEASE_TABLET | Freq: Three times a day (TID) | ORAL | 0 refills | Status: AC

## 2016-11-30 MED ORDER — TRAZODONE HCL 50 MG PO TABS: 25.0000 mg | ORAL_TABLET | Freq: Every day | ORAL | 0 refills | Status: AC

## 2016-11-30 MED ORDER — ALLOPURINOL 100 MG PO TABS: 200.0000 mg | ORAL_TABLET | Freq: Every day | ORAL | 0 refills | Status: AC

## 2016-11-30 MED ORDER — LEVOTHYROXINE SODIUM 137 MCG PO TABS: 137.0000 ug | ORAL_TABLET | Freq: Every day | ORAL | 0 refills | Status: DC

## 2016-11-30 MED ORDER — SPIRONOLACTONE 25 MG PO TABS: 12.5000 mg | ORAL_TABLET | Freq: Every day | ORAL | 0 refills | Status: AC

## 2016-11-30 MED ORDER — DONEPEZIL HCL 10 MG PO TABS: ORAL_TABLET | ORAL | 0 refills | Status: AC

## 2016-11-30 MED ORDER — FUROSEMIDE 40 MG PO TABS: 40.0000 mg | ORAL_TABLET | Freq: Every day | ORAL | 0 refills | Status: AC

## 2016-11-30 MED ORDER — MORPHINE SULFATE 10 MG/5ML PO SOLN: 2.0000 mg | ORAL | 0 refills | Status: AC | PRN

## 2016-11-30 NOTE — Discharge Summary (Signed)
[facility]  Physician Discharge Summary   Patient ID: Chris Stokes MRN: [id number] DOB/AGE: 06-Jun-1929 83 y.o.  Admit date: 11/23/2016 Discharge date: 11/30/2016  PCP: [name], MD  DISCHARGE DIAGNOSES:  Principal Problem:   Acute on chronic respiratory failure with hypoxia ([place]) Active Problems:   Hypothyroidism   Hyperlipidemia with target LDL less than 70; Statin intolerant   Essential hypertension   Coronary artery disease involving native heart without angina pectoris   IPF (idiopathic pulmonary fibrosis) (HCC)   Acute on chronic congestive heart failure (HCC)   AKI (acute kidney injury) (HCC)   Thrombocytopenia (HCC)   Pressure injury of skin   Hypoxia   Shortness of breath   Goals of care, counseling/discussion   Palliative care encounter   RECOMMENDATIONS FOR OUTPATIENT FOLLOW UP: 1. Patient to be followed by hospice services 2. Needs oxygen  DISCHARGE CONDITION: fair  Diet recommendation: Heart healthy  Filed Weights   11/27/16 0545 11/28/16 0432 11/30/16 0505  Weight: 75.6 kg (166 lb 9.6 oz) 68.6 kg (151 lb 3.2 oz) 69.4 kg (152 lb 14.4 oz)    INITIAL HISTORY: 83 y.o.malewith a Past Medical History of hypothyroidism, HLD, IPF, HTN who presented with SOB. No significant evidence of volume overload, respiratory failure most likely related to progression of IPF, been followed by pulmonary and cardiology, recommendation is for hospice at his assisted living facility.  Consultations: Cardiology. Pulmonology. Palliative medicine   HOSPITAL COURSE:   Acute on chronic hypoxic respiratory failure Most likely due to pulmonary fibrosis and heart failure. VQ scan negative for PE. Currently on high flow oxygen by nasal cannula. Stable on same.  Acute on chronic systolic and diastolic congestive heart failure Most recent echo 08/2016 with EF 25-30% with diffuse hypokinesis and grade 1 diastolic dysfunction. Patient is on oral Lasix. Additionally he  has received a few doses of intravenous Lasix. Appears to be euvolemic at this time. Continue Cozaar, Coreg and Aldactone.   Idiopathic pulmonary fibrosis  Continue with oxygen. Seen by pulmonology. Palliative medicine consult recommended. Seen by palliative medicine. He is a candidate for hospice services. This will be arranged at the assisted living facility.   Ascending aortic aneurysm 4.9cm was 4.4 on prior CT. Patient not a candidate for surgical intervention.   Thrombocytopenia Patient's usually baseline on the lower side 120s to 130s. Counts are low but stable. No evidence for bleeding. Continue to monitor periodically.  Hyperlipidemia Continue statin  Essential Hypertension Continue with home medication  Hypothyroidism Continue Synthroid  Patient has improved over the last few days. He is stable for discharge.   PERTINENT LABS:  The results of significant diagnostics from this hospitalization (including imaging, microbiology, ancillary and laboratory) are listed below for reference.    Microbiology: Recent Results (from the past 240 hour(s))  Urine culture     Status: None   Collection Time: 11/23/16  2:25 PM  Result Value Ref Range Status   Specimen Description URINE, CATHETERIZED  Final   Special Requests NONE  Final   Culture NO GROWTH  Final   Report Status 11/24/2016 FINAL  Final  MRSA PCR Screening     Status: None   Collection Time: 11/23/16  4:59 PM  Result Value Ref Range Status   MRSA by PCR NEGATIVE NEGATIVE Final    Comment:        The GeneXpert MRSA Assay (FDA approved for NASAL specimens only), is one component of a comprehensive MRSA colonization surveillance program. It is not intended to diagnose MRSA  infection nor to guide or monitor treatment for MRSA infections.   Culture, blood (routine x 2) Call MD if unable to obtain prior to antibiotics being given     Status: None   Collection Time: 11/23/16  5:00 PM  Result Value Ref Range  Status   Specimen Description BLOOD LEFT ARM  Final   Special Requests BOTTLES DRAWN AEROBIC ONLY [place]  Final   Culture NO GROWTH 5 DAYS  Final   Report Status 11/28/2016 FINAL  Final  Culture, blood (routine x 2) Call MD if unable to obtain prior to antibiotics being given     Status: None   Collection Time: 11/23/16  5:10 PM  Result Value Ref Range Status   Specimen Description BLOOD LEFT HAND  Final   Special Requests BOTTLES DRAWN AEROBIC ONLY  [place]  Final   Culture NO GROWTH 5 DAYS  Final   Report Status 11/28/2016 FINAL  Final  Culture, sputum-assessment     Status: None   Collection Time: 11/26/16 11:39 AM  Result Value Ref Range Status   Specimen Description EXPECTORATED SPUTUM  Final   Special Requests NONE  Final   Sputum evaluation   Final    Sputum specimen not acceptable for testing.  Please recollect.   Results Called to: T. FAIRING, RN AT 1400 ON 11/26/16 BY C. JESSUP, MLT.    Report Status 11/26/2016 FINAL  Final     Labs: Basic Metabolic Panel:  Recent Labs Lab 11/23/16 1700 11/23/16 2236 11/24/16 0407 11/25/16 0447 11/27/16 0412 11/29/16 0455  NA  --   --  139 138 139 137  K  --   --  4.0 4.0 3.3* 3.9  CL  --   --  104 102 100* 100*  CO2  --   --  29 28 32 29  GLUCOSE  --   --  130* 90 84 97  BUN  --   --  17 16 12 19   CREATININE 1.20  --  1.21 1.25* 1.03 1.20  CALCIUM  --   --  9.2 9.0 9.2 9.5  MG  --  1.8  --   --   --   --   PHOS  --  2.5  --   --   --   --    CBC:  Recent Labs Lab 11/23/16 1700 11/24/16 0407 11/25/16 0447  WBC 6.8 5.2 7.8  HGB 16.5 14.7 14.1  HCT 50.4 44.9 43.5  MCV 104.8* 102.7* 103.1*  PLT 81* 71* 76*   Cardiac Enzymes:  Recent Labs Lab 11/23/16 1700 11/23/16 2236 11/24/16 0407  TROPONINI 0.03* 0.03* 0.03*   BNP: BNP (last 3 results)  Recent Labs  11/23/16 1139 11/26/16 0317  BNP 1,680.4* 1,713.8*     IMAGING STUDIES Dg Chest 2 View  Result Date: 11/23/2016 CLINICAL DATA:  Short of breath and  cough. History of pulmonary fibrosis. EXAM: CHEST  2 VIEW COMPARISON:  07/16/2016 FINDINGS: Prior median sternotomy. Midline trachea. Moderate cardiomegaly. Right superior mediastinal soft tissue fullness is present back to 06/03/2014 and is likely attributed to tortuous aorta and possibly mild adenopathy. Bilateral pleural thickening or trace pleural fluid. No pneumothorax. Severe interstitial lung disease is lower lobe and peripheral predominant. Given this underlying chronic finding, there is no convincing evidence of superimposed acute pneumonia or overt congestive failure. Apparent increase in interstitial prominence is felt to be due to AP portable technique and resultant decreased inspiratory effort. IMPRESSION: Interstitial lung disease, consistent with the  history of pulmonary fibrosis. No convincing evidence of acute superimposed process. Cardiomegaly.  No overt congestive failure. Electronically Signed   By: Abigail Miyamoto M.D.   On: 11/23/2016 12:14   Ct Chest High Resolution  Result Date: 11/24/2016 CLINICAL DATA:  83 year old male with history of idiopathic pulmonary fibrosis. Follow-up study. EXAM: CT CHEST WITHOUT CONTRAST TECHNIQUE: Multidetector CT imaging of the chest was performed following the standard protocol without intravenous contrast. High resolution imaging of the lungs, as well as inspiratory and expiratory imaging, was performed. COMPARISON:  Chest CT 11/10/2012. FINDINGS: Cardiovascular: Heart size is enlarged with severe right atrial dilatation. There is no significant pericardial fluid, thickening or pericardial calcification. There is aortic atherosclerosis, as well as atherosclerosis of the great vessels of the mediastinum and the coronary arteries, including calcified atherosclerotic plaque in the left main, left anterior descending, left circumflex and right coronary arteries. Status post median sternotomy for CABG, including LIMA to the LAD. Calcifications of the aortic  valve. Aneurysmal dilatation of ascending thoracic aorta which measures up to 4.9 cm in diameter. Ectasia of the aortic arch which measures up to 4.1 cm in diameter distally. Mediastinum/Nodes: Multiple borderline enlarged and enlarged mediastinal and bilateral hilar lymph nodes measuring up to 2 cm in short axis in the low right paratracheal nodal station. Esophagus is unremarkable in appearance. No axillary lymphadenopathy. Lungs/Pleura: High-resolution images demonstrate widespread subpleural reticulation, parenchymal banding, traction bronchiectasis and honeycombing, all of which demonstrates a craniocaudal gradient. These findings have progressed compared to prior study from 11/10/2012, and are considered diagnostic from an imaging standpoint of usual interstitial pneumonia (UIP). Inspiratory and expiratory imaging is unremarkable. Mild diffuse bronchial wall thickening with mild to moderate centrilobular and paraseptal emphysema noted predominantly in the lung apices. Small bilateral pleural effusions lying dependently. No acute consolidative airspace disease. Upper Abdomen: Aortic atherosclerosis. Incompletely visualized low-attenuation lesion measuring at least 3.3 cm in the upper pole of left kidney, slightly larger than remote prior study from 2013, presumably a cyst. Multiple small calcified granulomas are noted in the liver. Musculoskeletal: Median sternotomy wires. There are no aggressive appearing lytic or blastic lesions noted in the visualized portions of the skeleton. IMPRESSION: 1. The appearance of the lungs is compatible with interstitial lung disease, and findings are considered diagnostic of usual interstitial pneumonia (UIP). 2. There is also mild diffuse bronchial wall thickening with mild to moderate centrilobular and paraseptal emphysema noted predominantly in the lung apices. 3. Cardiomegaly with severe right atrial dilatation. 4. Aortic atherosclerosis, in addition to left main and 3  vessel coronary artery disease. Status post median sternotomy for CABG, including LIMA to the LAD. 5. There are calcifications of the aortic valve. Echocardiographic correlation for evaluation of potential valvular dysfunction may be warranted if clinically indicated. 6. Aneurysmal dilatation of the ascending thoracic aorta (4.9 cm in diameter). Ascending thoracic aortic aneurysm. Recommend semi-annual imaging followup by CTA or MRA and referral to cardiothoracic surgery if not already obtained. This recommendation follows 2010 ACCF/AHA/AATS/ACR/ASA/SCA/SCAI/SIR/STS/SVM Guidelines for the Diagnosis and Management of Patients With Thoracic Aortic Disease. Circulation. 2010; 121: [id number]. 7. Additional incidental findings, as above. Electronically Signed   By: Vinnie Langton M.D.   On: 11/24/2016 16:13   Nm Pulmonary Perf And Vent  Result Date: 11/24/2016 CLINICAL DATA:  Chronic respiratory failure through severe hypoxia. Congestive heart failure. EXAM: NUCLEAR MEDICINE VENTILATION - PERFUSION LUNG SCAN TECHNIQUE: Ventilation images were obtained in multiple projections using inhaled aerosol Tc-61m DTPA. Perfusion images were obtained in multiple projections after intravenous  injection of Tc-51m MAA. RADIOPHARMACEUTICALS:  31.3 mCi Technetium-80m DTPA aerosol inhalation and 4.2 mCi Technetium-42m MAA IV COMPARISON:  Focal full FINDINGS: Ventilation: No significant focal ventilation defect. Perfusion: No wedge shaped peripheral perfusion defects to suggest acute pulmonary embolism. IMPRESSION: No evidence suggestive recent pulmonary embolism. Electronically Signed   By: Lorriane Shire M.D.   On: 11/24/2016 10:11    DISCHARGE EXAMINATION: Vitals:   11/29/16 1630 11/29/16 1937 11/30/16 0505 11/30/16 1202  BP:  97/63 104/60 110/65  Pulse: 74 61 69 66  Resp:  20 18 18   Temp:  97.9 F (36.6 C) 98.4 F (36.9 C) 98 F (36.7 C)  TempSrc:  Oral Oral Oral  SpO2:  97% 94% 98%  Weight:   69.4 kg (152 lb  14.4 oz)   Height:       General appearance: alert, cooperative, appears stated age and no distress Resp: clear to auscultation bilaterally Cardio: regular rate and rhythm, S1, S2 normal, no murmur, click, rub or gallop GI: soft, non-tender; bowel sounds normal; no masses,  no organomegaly Extremities: extremities normal, atraumatic, no cyanosis or edema  DISPOSITION: Assisted living facility  Discharge Instructions    Call MD for:  difficulty breathing, headache or visual disturbances    Complete by:  As directed    Call MD for:  extreme fatigue    Complete by:  As directed    Call MD for:  persistant dizziness or light-headedness    Complete by:  As directed    Call MD for:  persistant nausea and vomiting    Complete by:  As directed    Call MD for:  severe uncontrolled pain    Complete by:  As directed    Call MD for:  temperature >100.4    Complete by:  As directed    Discharge instructions    Complete by:  As directed    Patient to be followed by hospice services at assisted living facility.  You were cared for by a hospitalist during your hospital stay. If you have any questions about your discharge medications or the care you received while you were in the hospital after you are discharged, you can call the unit and asked to speak with the hospitalist on call if the hospitalist that took care of you is not available. Once you are discharged, your primary care physician will handle any further medical issues. Please note that NO REFILLS for any discharge medications will be authorized once you are discharged, as it is imperative that you return to your primary care physician (or establish a relationship with a primary care physician if you do not have one) for your aftercare needs so that they can reassess your need for medications and monitor your lab values. If you do not have a primary care physician, you can call [phone] for a physician referral.   Increase activity slowly     Complete by:  As directed       ALLERGIES:  Allergies  Allergen Reactions  . Lipitor [Atorvastatin] Other (See Comments)    CRAMPS  . Ramipril Cough  . Simvastatin Other (See Comments)    MYALGIA  . Sulfonamide Derivatives Rash     Current Discharge Medication List    START taking these medications   Details  morphine 10 MG/5ML solution Take 1 mL (2 mg total) by mouth every 2 (two) hours as needed (dyspnea). Qty: 100 mL, Refills: 0    traZODone (DESYREL) 50 MG tablet Take 0.5 tablets (  25 mg total) by mouth at bedtime. Qty: 30 tablet, Refills: 0      CONTINUE these medications which have CHANGED   Details  allopurinol (ZYLOPRIM) 100 MG tablet Take 2 tablets (200 mg total) by mouth daily. Qty: 180 tablet, Refills: 0    amLODipine (NORVASC) 10 MG tablet Take 1 tablet (10 mg total) by mouth daily. Qty: 30 tablet, Refills: 0    carvedilol (COREG) 12.5 MG tablet Take 1 tablet (12.5 mg total) by mouth 2 (two) times daily with a meal. Qty: 60 tablet, Refills: 0    donepezil (ARICEPT) 10 MG tablet Take 10 mg by mouth at bedtime Qty: 30 tablet, Refills: 0    furosemide (LASIX) 40 MG tablet Take 1 tablet (40 mg total) by mouth daily. Qty: 30 tablet, Refills: 0    levothyroxine (SYNTHROID, LEVOTHROID) 137 MCG tablet Take 1 tablet (137 mcg total) by mouth daily before breakfast. Qty: 30 tablet, Refills: 0    losartan (COZAAR) 100 MG tablet Take 1 tablet (100 mg total) by mouth daily. Qty: 30 tablet, Refills: 0    Mesalamine 800 MG TBEC Take 2 tablets (1,600 mg total) by mouth 3 (three) times daily. Qty: 180 tablet, Refills: 0    mirabegron ER (MYRBETRIQ) 50 MG TB24 tablet Take 1 tablet (50 mg total) by mouth daily. Qty: 30 tablet, Refills: 0    primidone (MYSOLINE) 50 MG tablet Take 50 mg by mouth three times a day for tremor(s) Qty: 90 tablet, Refills: 0    spironolactone (ALDACTONE) 25 MG tablet Take 0.5 tablets (12.5 mg total) by mouth daily. Qty: 45 tablet,  Refills: 0      CONTINUE these medications which have NOT CHANGED   Details  alum hydroxide-mag trisilicate (GAVISCON) [id number] MG CHEW chewable tablet Chew 2 tablets by mouth 2 (two) times daily as needed for indigestion or heartburn. Qty: 120 tablet, Refills: 2    aspirin EC 81 MG tablet Take 81 mg by mouth daily.    calcium carbonate (TUMS - DOSED IN MG ELEMENTAL CALCIUM) 500 MG chewable tablet Chew 1 tablet by mouth 3 (three) times daily as needed for indigestion or heartburn. Reported on 01/27/2016    carboxymethylcellulose (REFRESH PLUS) 0.5 % SOLN Place 1 drop into both eyes 3 (three) times daily as needed (for dry eyes).     cetirizine (ZYRTEC) 10 MG tablet Take 10 mg by mouth daily.     cholecalciferol (VITAMIN D) 1000 units tablet Take 1,000 Units by mouth 2 (two) times daily.    folic acid (FOLVITE) [id number] MCG tablet Take 800 mcg by mouth daily.      ipratropium (ATROVENT) 0.06 % nasal spray Place 2 sprays into the nose 4 (four) times daily as needed for rhinitis (runny nose). Qty: 45 mL, Refills: 2    Omega-3 Fatty Acids (FISH OIL) 1000 MG CAPS Take 1,000 mg by mouth every evening.     OXYGEN Inhale 2 L/min into the lungs See admin instructions. Via nasal cannula as needed for activity and sleep    Probiotic Product (PROBIOTIC DAILY PO) Take 1 capsule by mouth daily.    UNABLE TO FIND Liquid Life: Drink 1 ounce by mouth every morning      STOP taking these medications     gabapentin (NEURONTIN) 300 MG capsule      ibuprofen (ADVIL,MOTRIN) 200 MG tablet      levofloxacin (LEVAQUIN) 750 MG tablet      tolterodine (DETROL LA) 4 MG 24 hr capsule  Follow-up Information    [name], MD. Schedule an appointment as soon as possible for a visit.   Specialty:  Internal Medicine Contact information: [address] [place] [address] [phone]           TOTAL DISCHARGE TIME: 71 minutes  [facility] Pager  [phone]  11/30/2016, 1:50 PM

## 2016-11-30 NOTE — Clinical Social Work Note (Signed)
Received call from [facility]. They need to order a second oxygen concentrator and will not be able to get it until tomorrow. Patient and his daughter updated. Patient has an Medical laboratory scientific officer. Hospice notified. Patient's daughter states that he does not need a hospital bed at ALF.  MD and RN notified.  Dayton Scrape, [place]

## 2016-11-30 NOTE — Progress Notes (Signed)
Occupational Therapy Treatment Patient Details Name: Jatinder Buffkin MRN: [id number] DOB: 01/31/1929 Today's Date: 11/30/2016    History of present illness Pt is an 83 y/o male admitted secondary to respiratory failure with hypoxia. PMH including but not limited to CKD, CAD s/p STEMI (1984) and CABG x4 (2004), HTN and pulmonary fibrosis.   OT comments  Pt making progress with functional goals. Pt's daughter present during session and states that pt may return to ALF today. O2 SATs remained >90% during session  Follow Up Recommendations  SNF    Equipment Recommendations  None recommended by OT    Recommendations for Other Services      Precautions / Restrictions Precautions Precautions: Fall Precaution Comments: monitor SPO2 Restrictions Weight Bearing Restrictions: No       Mobility Bed Mobility Overal bed mobility: Needs Assistance Bed Mobility: Sit to Supine       Sit to supine: Min guard;HOB elevated   General bed mobility comments: increased time, use of bed rails and min guard for safety  Transfers Overall transfer level: Needs assistance Equipment used: 1 person hand held assist Transfers: Sit to/from [facility] Sit to Stand: Min assist Stand pivot transfers: Min assist            Balance Overall balance assessment: Needs assistance   Sitting balance-Leahy Scale: Fair       Standing balance-Leahy Scale: Poor Standing balance comment: Pt stood at toilet sink during grooming/hygiene with assist to steady, used grab bars a toilet                   ADL Overall ADL's : Needs assistance/impaired     Grooming: Wash/dry hands;Wash/dry face;Standing;Minimal assistance   Upper Body Bathing: Supervision/ safety;Set up;Sitting (simulated)   Lower Body Bathing: Sit to/from stand;Moderate assistance;Sitting/lateral leans (simulated)       Lower Body Dressing: Sit to/from stand;Moderate assistance   Toilet Transfer: Minimal  assistance;Ambulation;Comfort height toilet;Grab bars   Toileting- Clothing Manipulation and Hygiene: Moderate assistance       Functional mobility during ADLs: Minimal assistance General ADL Comments: pt able to stand at toilet and sink for hygiene and grooming with assist for balance/safety during tasks      Vision  wears glasses, no change from baseline                              Cognition   Behavior During Therapy: [employer] for tasks assessed/performed Overall Cognitive Status: Within Functional Limits for tasks assessed                       Extremity/Trunk Assessment   generalized weakness                        General Comments  pt very pleasant and cooperative    Pertinent Vitals/ Pain       Pain Assessment: No/denies pain                                                          Frequency  Min 2X/week        Progress Toward Goals  OT Goals(current goals can now be found in the care plan section)  Progress towards OT  goals: Progressing toward goals  Acute Rehab OT Goals Patient Stated Goal: return to ALF OT Goal Formulation: With patient/family  Plan Discharge plan remains appropriate                     End of Session Equipment Utilized During Treatment: Oxygen;Gait belt   Activity Tolerance Patient tolerated treatment well   Patient Left with call bell/phone within reach;in chair;with family/visitor present   Nurse Communication          Time: [id number] OT Time Calculation (min): 32 min  Charges: OT General Charges $OT Visit: 1 Procedure OT Treatments $Self Care/Home Management : 8-22 mins $Therapeutic Activity: 8-22 mins  Britt Bottom 11/30/2016, 12:33 PM

## 2016-11-30 NOTE — Progress Notes (Addendum)
Patient is returning to [facility] with Hospice following; CM talked to [name]  [phone] for home hospice choice, she chose  Hospice and [facility]; Hospice to arrange hospital bed and oxygen / DME; Awaiting for Hospice to call CM for final arrangements. Mindi Slicker [facility] [phone]

## 2016-11-30 NOTE — Clinical Social Work Note (Addendum)
CSW met with patient and his daughter. Patient's daughter stated that her mother had not gotten sick at the facility for the past 1-2 days. She has spoken with the MD about this and wanted to see if someone at the facility could examine her and determine if it is safe for patient to return.  CSW called and left voicemail for med tech at facility.  Dayton Scrape, [place] [phone]  2:19 pm Per ALF staff, patient can return but it is just not advised. The main concern is not patient's wife but instead staff going in and out of other patient's rooms. Patient's daughter has decided that he will return to ALF but wants staff to use hand sanitizer as they enter the room. CSW called and left voicemail for Charlatta at ALF letting her know plan. CSW discussed with her earlier today and she said she needed to know plan by 4:00 pm. CSW discussed with MD. CSW faxed discharge summary to ALF.  Dayton Scrape, [place] [phone]  2:37 pm Received the confirmation that patient from ALF staff that patient can return today once hospice arrangements have been made with [facility]. They will need our palliative team to set up hospice services. CSW left voicemail for [employer].   Dayton Scrape, [place] [phone]  2:45 pm Per patient's daughter, he has a wheelchair, walker, bedside commode, shower chair, safety belt, and oxygen at ALF. Discussed with Alecia with palliative and she said that [facility] would need to order hospital bed, equipment for 8 L oxygen, and hospice. CSW discussed with RNCM. She asked that [facility] page MD to put in order for hospital bed.   Dayton Scrape, CSW [phone]  3:49 pm O2 oxymizer in patient's room. RNCM awaiting call back from [facility] regarding hospital bed. Patient and his daughter updated.  Dayton Scrape, [place]

## 2016-11-30 NOTE — Discharge Instructions (Signed)
Hospice Hospice is a service that is designed to provide people who are terminally ill and their families with medical, spiritual, and psychological support. Its aim is to improve your quality of life by keeping you as alert and comfortable as possible. Hospice is performed by a team of health care professionals and volunteers who:  Help keep you comfortable. Hospice can be provided in your home or in a homelike setting. The hospice staff works with your family and friends to help meet your needs. You will enjoy the support of loved ones by receiving much of your basic care from family and friends.  Provide pain relief and manage your symptoms. The staff supply all necessary medicines and equipment.  Provide companionship when you are alone.  Allow you and your family to rest. They may do light housekeeping, prepare meals, and run errands.  Provide counseling. They will make sure your emotional, spiritual, and social needs and those of your family are being met.  Provide spiritual care. Spiritual care is individualized to meet your needs and your family's needs. It may involve helping you look at what death means to you, say goodbye, or perform a specific religious ceremony or ritual. Hospice teams often include:  A nurse.  A doctor.  Social workers.  Religious leaders (such as a Clinical biochemist).  Trained volunteers. WHEN SHOULD HOSPICE CARE BEGIN? Most people who use hospice are believed to have fewer than 6 months to live. Your family and health care providers can help you decide when hospice services should begin. If your condition improves, you may discontinue the program. WHAT [place]? Most hospice programs are run by nonprofit, independent organizations. Some are affiliated with hospitals, nursing homes, or home health care agencies. Hospice programs can take place in the home or at a hospice center, hospital, or skilled nursing facility. When choosing  a hospice program, ask the following questions:  What services are available to me?  What services are offered to my loved ones?  How involved are my loved ones?  How involved is my health care provider?  Who makes up the hospice care team? How are they trained or screened?  How will my pain and symptoms be managed?  If my circumstances change, can the services be provided in a different setting, such as my home or in the hospital?  Is the program reviewed and licensed by the state or certified in some other way? WHERE CAN I LEARN MORE ABOUT HOSPICE? You can learn about existing hospice programs in your area from your health care providers. You can also read more about hospice online. The websites of the following organizations contain helpful information:  The [facility] and Palliative Care Organization [facility]).  The Hospice Association of America ([place]).  The [place].  The American Cancer Society (ACS).  Hospice Net. This information is not intended to replace advice given to you by your health care provider. Make sure you discuss any questions you have with your health care provider. Document Released: 03/03/2004 Document Revised: 11/20/2013 Document Reviewed: 09/25/2013 Elsevier Interactive Patient Education  2017 Elsevier Inc.   Pulmonary Fibrosis Pulmonary fibrosis is a type of lung disease that causes scarring. Over time, the scar tissue (fibrosis) builds up in the air sacs of your lungs. This makes it hard for you to breathe. Less oxygen can get into your blood. Scarring from pulmonary fibrosis gets worse over time. This damage is permanent. Having damaged lungs may make it more  likely that you will have heart problems as well. What are the causes? Usually, the cause of pulmonary fibrosis is not known (idiopathic pulmonary fibrosis). However, pulmonary fibrosis can be caused by:  Exposure to occupational and environmental toxins. These include  asbestos, silica, and metal dusts.  Inhaling moldy hay. This can cause an allergic reaction in the lung (farmer's lung) that can lead to pulmonary fibrosis.  Inhaling toxic fumes.  Certain medicines. These include drugs used in radiation therapy or used to treat seizures, heart problems, and some infections.  Autoimmune diseases, such as rheumatoid arthritis, systemic sclerosis, and connective tissue diseases.  Sarcoidosis. In this disease, areas of inflammatory cells (granulomas) form and most often affect the lungs.  Genes. Some cases of pulmonary fibrosis may be passed down through families. What increases the risk? You may be at a higher risk for developing pulmonary fibrosis if:  You have a family history of the disease.  You have an autoimmune disease or another condition linked to pulmonary fibrosis.  You are exposed to certain substances or fumes found in agricultural, farm, Architect, or factory work.  You take certain medicines. What are the signs or symptoms? Symptoms may include:  Difficulty breathing that gets worse with activity.  Dry, hacking cough.  Rapid, shallow breathing during exercise or while at rest.  Shortness of breath that gets worse (dyspnea).  Bluish skin and lips.  Loss of appetite.  Loss of strength.  Weight loss and fatigue.  Rounded and enlarged fingertips (clubbing). How is this diagnosed? Your health care provider may suspect pulmonary fibrosis based on your symptoms and medical history. Diagnosis may include a physical exam. Your health care provider will check for signs that strongly suggest that you have pulmonary fibrosis, such as:  Blue skin around your fingernails or mouth from reduced oxygen.  Clubbing around the ends of your fingers.  A crackling sound when you breathe. Your health care provider may also do tests to confirm the diagnosis. These may include:  Looking inside your lungs with an instrument  (bronchoscopy).  Imaging studies of your lungs and heart using:  X-rays.  CT scan.  Sound waves (echocardiogram).  Tests to measure how well you are breathing (pulmonary function tests).  Exercise testing to see how well your lungs work while you are walking.  Blood tests.  A procedure to remove a small piece of lung tissue to examine in a lab (biopsy). How is this treated? There is no cure for pulmonary fibrosis. Treatments focus on managing symptoms and preventing scarring from getting worse. This can include:  Medicines.  You may take steroids to prevent permanent lung changes. Your health care provider may put you on a high dose at first, then on lower dosages for the long term.  Medicines to suppress your bodys defense (immune) system. These can have serious side effects.  You may be monitored with X-rays and laboratory work.  Oxygen therapy may be helpful if oxygen in your blood is low.  Surgery. In some cases, a lung transplant is an option. Follow these instructions at home:  Take medicines only as directed by your health care provider.  Keep your vaccinations up to date as recommended by your health care provider.  Do not use any tobacco products, including cigarettes, chewing tobacco, or electronic cigarettes. If you need help quitting, ask your health care provider.  Get regular exercise, but do not overexert yourself. Ask your health care provider to suggest some activities that are safe for you  to do. Walking and chair exercises can help if you have physical limitations.  Consider joining a pulmonary rehabilitation program or a support group for people with pulmonary fibrosis.  Eat small meals often so you do not get too full. Overeating can make breathing trouble worse.  Maintain a healthy weight. Lose weight if you need to.  Do breathing exercises as directed by your health care provider.  Keep all follow-up visits as directed by your health care  provider. This is important. Contact a health care provider if:  You are not able to be as active as usual.  You have a long-lasting (chronic) cough.  You are often short of breath.  You have a fever or chills. Get help right away if:  You have chest pain.  Your breathing is much worse.  You cannot take a deep breath.  You have blue skin around your mouth or fingers.  You have clubbing of your fingers.  You cough up mucus that is dark in color.  You have a lot of headaches.  You get very confused or sleepy. This information is not intended to replace advice given to you by your health care provider. Make sure you discuss any questions you have with your health care provider. Document Released: 02/05/2004 Document Revised: 04/22/2016 Document Reviewed: 04/25/2014 Elsevier Interactive Patient Education  2017 [employer].

## 2016-11-30 NOTE — Progress Notes (Signed)
Daily Progress Note   Patient Name: Chris Stokes       Date: 11/30/2016 DOB: 14-Jan-1929  Age: 83 y.o. MRN#: [id number] Attending Physician: Bonnielee Haff, MD Primary Care Physician: [name], MD Admit Date: 11/23/2016  Reason for Consultation/Follow-up: Establishing goals of care  Subjective: Chris Stokes is sitting up in recliner. Appears to be more comfortable and breathing improved some with diuresis - no current complaints.   Length of Stay: 7  Current Medications: Scheduled Meds:  . acidophilus   Oral Daily  . amLODipine  10 mg Oral Daily  . carvedilol  12.5 mg Oral BID WC  . donepezil  10 mg Oral QHS  . folic acid  1 mg Oral Daily  . furosemide  40 mg Oral Daily  . levothyroxine  150 mcg Oral QAC breakfast  . losartan  100 mg Oral Daily  . mouth rinse  15 mL Mouth Rinse BID  . mirabegron ER  50 mg Oral Daily  . omega-3 acid ethyl esters  1 g Oral Daily  . spironolactone  12.5 mg Oral Daily  . traZODone  25 mg Oral QHS    Continuous Infusions:   PRN Meds: albuterol, morphine, polyvinyl alcohol, technetium TC 30M diethylenetriame-pentaacetic acid  Physical Exam  Constitutional: He is oriented to person, place, and time. He appears well-developed.  HENT:  Head: Normocephalic and atraumatic.  Cardiovascular: Normal rate.   Pulmonary/Chest: No accessory muscle usage. No tachypnea. No respiratory distress. He has decreased breath sounds.  Abdominal: Soft. Normal appearance.  Neurological: He is alert and oriented to person, place, and time.  Sleepy  Nursing note and vitals reviewed.           Vital Signs: BP 110/65 (BP Location: Left Arm)   Pulse 66   Temp 98 F (36.7 C) (Oral)   Resp 18   Ht 5\' 10"  (1.778 m)   Wt 69.4 kg (152 lb 14.4 oz)   SpO2 98%   BMI  21.94 kg/m  SpO2: SpO2: 98 % O2 Device: O2 Device: Nasal Cannula O2 Flow Rate: O2 Flow Rate (L/min): 8 L/min  Intake/output summary:  Intake/Output Summary (Last 24 hours) at 11/30/16 1312 Last data filed at 11/30/16 0900  Gross per 24 hour  Intake  360 ml  Output             1200 ml  Net             -840 ml   LBM: Last BM Date: 11/28/16 Baseline Weight: Weight: 82.8 kg (182 lb 8 oz) Most recent weight: Weight: 69.4 kg (152 lb 14.4 oz)       Palliative Assessment/Data:      Patient Active Problem List   Diagnosis Date Noted  . Hypoxia   . Shortness of breath   . Goals of care, counseling/discussion   . Palliative care encounter   . Acute on chronic respiratory failure with hypoxia ([place]) 11/23/2016  . Acute on chronic congestive heart failure ([place]) 11/23/2016  . AKI (acute kidney injury) ([place]) 11/23/2016  . Thrombocytopenia ([place]) 11/23/2016  . Pressure injury of skin 11/23/2016  . Impaired mobility 11/01/2016  . Chronic combined systolic and diastolic heart failure, NYHA class 3 ([place]) 10/27/2016  . DOE (dyspnea on exertion) 09/15/2016  . Cardiomyopathy, ischemic 09/15/2016  . Advance directive indicates patient wish for do-not-resuscitate status 09/15/2016  . Abnormal nuclear stress test 09/01/2016  . Elevated LFTs 08/05/2016  . Coronary artery disease, occlusive   . Diarrhea 03/15/2016  . Well adult exam 01/27/2016  . Gait disorder 10/28/2015  . Lumbar radiculopathy 07/30/2015  . Chronic respiratory failure with hypoxia ([place]) 06/04/2015  . Greater trochanteric bursitis of right hip 08/20/2014  . Low back pain 07/30/2014  . Acute right hip pain 07/23/2014  . Urinary incontinence 07/23/2014  . Neuropathy ([place]) 11/27/2013  . Dementia 02/06/2013  . Action tremor 01/04/2013  . IPF (idiopathic pulmonary fibrosis) ([place]) 11/07/2012  . Ischemic heart disease 07/20/2011  . HYPERKALEMIA 07/16/2010  . HYPERGLYCEMIA 07/16/2010  . HAND PAIN 10/31/2009  .  TOBACCO USE, QUIT 10/31/2009  . GERD 08/27/2009  . HEMATOCHEZIA 10/29/2008  . COLONIC POLYPS 03/30/2008  . ABDOMINAL AORTIC ANEURYSM 03/30/2008  . Ulcerative colitis ([place]) 03/30/2008  . NEPHROLITHIASIS, HX OF 03/30/2008  . Hypothyroidism 01/18/2008  . Hyperlipidemia with target LDL less than 70; Statin intolerant 01/18/2008  . Essential hypertension 01/18/2008  . OTHER SPECIFIED ACQUIRED HYPOTHYROIDISM 01/12/2008  . Coronary artery disease involving native heart without angina pectoris 01/12/2008  . Bladder neck obstruction 01/12/2008  . OTHER DRUG ALLERGY 01/12/2008    Palliative Care Assessment & Plan   HPI: 83 y.o. male  with past medical history of hypothyroidism, HLD, HTN, systolic CHF, pulmonary fibrosis, CKD stage III, mild dementia admitted on 11/23/2016 with worsening SOB. SOB has improved some with diuresis but continues to require 8L nasal cannula.   Assessment: Daughter [name] at bedside and says she has no one has spoken with her about or from hospice. I am concerned because his oxygen requirements have drastically changed (now requiring 8L) and I fear they will not have equipment for him when he returns to ALF. They are happy with his progress and are preparing for return to ALF.   Recommendations/Plan:  Insomnia: Trazodone 25 mg qhs trial tonight.   Dyspnea: Optimize oxygen and recs per PCCM. Diurese per cardiology. Morphine 2 mg every 2 hours prn.    Goals of Care and Additional Recommendations:  Limitations on Scope of Treatment: Avoid Hospitalization and Full Comfort Care  Code Status:  DNR  Prognosis:   < 6 months  Discharge Planning:  Return to ALF with hospice.    Thank you for allowing the Palliative Medicine Team to assist in the care of this patient.  Total Time 85min Prolonged Time Billed  no       Greater than 50%  of this time was spent counseling and coordinating care related to the above assessment and plan.  Vinie Sill,  NP Palliative Medicine Team Pager # [phone] (M-F 8a-5p) Team Phone # [phone] (Nights/Weekends)

## 2016-11-30 NOTE — Progress Notes (Signed)
[facility] Liaison visit:  11/30/16  Notified by referral center at 3:31pm today of patient/family request for Hospice and [place] services at [facility] after discharge.  Chart and patient information sent to Dr. Shelbie Proctor for review.  Writer spoke with Marvetta Gibbons, daughter of patient, at bedside to initiate education related to hospice philosophy, services and team approach to care.  Family voiced understanding of information provided.  Per discussion, plan was for discharge tonight, but unable to get equipment switched out this evening.   Per [facility], patient under a different plan with O2 concentrator and needs to get it switched to hospice plan tomorrow with new O2 requirement.    DME needs discussed with patient's daughter.  She advised the only thing we need to get out to patient tomorrow will be the O2 concentrator and they would let us know about a hospital bed later.  [facility] equipment manager Jewel Ysidro Evert notified and will contact [place] to arrange delivery to [facility].  [facility] Referral Center aware of above.   Completed d/c summary will need to be faxed to [facility] at [phone] when final.  Please notify [facility] when patient is ready to leave unit at discharge --call [phone] or [phone] after 5pm.  [facility] information and contact numbers have been given to [facility] during visit.  Called [place], [facility], back to advise of same, she was unavailable and I left a voicemail.   Clarise Cruz, CSW was also notified of information above.    Please call with any questions.  Edyth Gunnels, RN, BSN [facility] Liaison [phone]  All hospital liaisons are now on [place].  You can call me direct or call hospice at [phone].

## 2016-12-01 MED ORDER — LEVOTHYROXINE SODIUM 150 MCG PO TABS: 150.0000 ug | ORAL_TABLET | Freq: Every day | ORAL | 0 refills | Status: AC

## 2016-12-01 NOTE — Progress Notes (Signed)
Received call from Leonides Grills PMT-NP that patients daughter is now wanting a hospital bed to go out to patient.   I put an order in to our Chartered certified accountant.   Confirmed request with Judson Roch, LCSW.     Thanks. Edyth Gunnels, RN, [facility] Liaison  All hospital liaison are now on [place].  Please feel free to contact me directly at [phone] or call hospice at [phone].

## 2016-12-01 NOTE — Progress Notes (Signed)
Patient slept well during the night, no complaints voiced.  Awaiting discharge today once concentrator arrives at facility.

## 2016-12-01 NOTE — Progress Notes (Signed)
Physical Therapy Treatment Patient Details Name: Chris Stokes MRN: [id number] DOB: 02-21-1929 Today's Date: 12/01/2016    History of Present Illness Pt is an 83 y/o male admitted secondary to respiratory failure with hypoxia. PMH including but not limited to CKD, CAD s/p STEMI (1984) and CABG x4 (2004), HTN and pulmonary fibrosis.    PT Comments    Pt presented supine in bed with HOB elevated, awake and willing to participate in therapy session. Pt making progress with mobility this session and was able to ambulate to the bathroom in his room. Pt would continue to benefit from skilled physical therapy services at this time while admitted to address his limitations in order to improve his overall safety and independence with functional mobility.    Follow Up Recommendations  No PT follow up;Other (comment)     Equipment Recommendations  None recommended by PT    Recommendations for Other Services       Precautions / Restrictions Precautions Precautions: Fall Precaution Comments: monitor SPO2 Restrictions Weight Bearing Restrictions: No    Mobility  Bed Mobility Overal bed mobility: Needs Assistance Bed Mobility: Sit to Supine;Supine to Sit     Supine to sit: Min guard;HOB elevated Sit to supine: Min guard   General bed mobility comments: increased time, use of bed rails and min guard for safety  Transfers Overall transfer level: Needs assistance Equipment used: Rolling walker (2 wheeled) Transfers: Sit to/from Stand Sit to Stand: Min assist         General transfer comment: pt required increased time, min A to rise to full standing from bed  Ambulation/Gait Ambulation/Gait assistance: Min guard Ambulation Distance (Feet): 30 Feet Assistive device: Rolling walker (2 wheeled) Gait Pattern/deviations: Step-through pattern;Decreased stride length Gait velocity: decreased   General Gait Details: pt demonstrated safety with use of RW, no instability or LOB  noted   Stairs            Wheelchair Mobility    Modified Rankin (Stroke Patients Only)       Balance Overall balance assessment: Needs assistance Sitting-balance support: Feet supported;No upper extremity supported Sitting balance-Leahy Scale: Fair     Standing balance support: During functional activity;Bilateral upper extremity supported Standing balance-Leahy Scale: Poor Standing balance comment: pt reliant on bilateral UEs on RW                    Cognition Arousal/Alertness: Awake/alert Behavior During Therapy: WFL for tasks assessed/performed Overall Cognitive Status: Within Functional Limits for tasks assessed                      Exercises      General Comments        Pertinent Vitals/Pain Pain Assessment: No/denies pain    Home Living                      Prior Function            PT Goals (current goals can now be found in the care plan section) Acute Rehab PT Goals Patient Stated Goal: "take a shower" PT Goal Formulation: With patient/family Time For Goal Achievement: 12/11/16 Potential to Achieve Goals: Fair Progress towards PT goals: Progressing toward goals    Frequency    Min 3X/week      PT Plan Current plan remains appropriate    Co-evaluation             End of Session Equipment Utilized During Treatment:  Gait belt;Oxygen;Other (comment) (10L of O2 via HFNC) Activity Tolerance: Patient tolerated treatment well Patient left: in bed;with call bell/phone within reach;with family/visitor present     Time: 0911-0931 PT Time Calculation (min) (ACUTE ONLY): 20 min  Charges:  $Gait Training: 8-22 mins                    G CodesClearnce Sorrel Metztli Sachdev 23-Dec-2016, 11:07 AM Sherie Don, PT, DPT [phone]

## 2016-12-01 NOTE — Progress Notes (Signed)
Amy, [facility], awaiting concentrator delivery to facility; she will call back when it is delivered.

## 2016-12-01 NOTE — Progress Notes (Signed)
Amy with [facility] called stated the concentrator to be delivered to [facility] around 1230-1300.  Per Amy, she will discuss with CSW regarding plan for transport etc.

## 2016-12-01 NOTE — Progress Notes (Signed)
[facility] Liaison communication:  Tried to get in touch with Judson Roch, CSW and [place], [facility], no answer.   Spoke with Caryl Pina, RN and advised that I have confirmed order for concentrator with our equipment manager and I would let her know when [facility] would be taking it to facility.   Caryl Pina, RN advised she will update Education officer, museum and Tourist information centre manager, as they are both still in progression.    Thank you,  Edyth Gunnels, RN, BSN [place] are now on [place].  Please feel free to contact me directly at [phone] or call hospice at [phone].

## 2016-12-01 NOTE — Progress Notes (Signed)
[place] and spoke with Casey Burkitt and left message for Judson Roch CSW.   Patient equipment should be delivered to [facility] today between 1230p and 1:30p.  Per RN, they will try to get transportation set up for 2:00pm, contingent on equipment delivery.    Thanks,  Edyth Gunnels, RN, BSN [facility] Liaison  All liaisons are now on [place].   Please call me directly at  [phone] or call hospice at [phone].

## 2016-12-01 NOTE — Clinical Social Work Note (Signed)
CSW faxed updated discharge summary to [place]. CSW spoke with Edyth Gunnels. She will notify CSW when she finds out a time the oxygen concentrator will be delivered so PTAR can be set up.  [place], [place]

## 2016-12-01 NOTE — Progress Notes (Addendum)
Pt d/c with daughter at bedside. Pt provided AVS, transport provided transport paperwork, DNR and prescriptions. Pt's daughter notified that the pt's oxygen concentrator was delivered at the facility, they are expecting a hospital bed to be delivered.    No concerns, all questions answered prior to departure.

## 2016-12-01 NOTE — Discharge Summary (Signed)
[facility]  Physician Discharge Summary   Patient ID: Chris Stokes MRN: [id number] DOB/AGE: 1929/03/31 83 y.o.  Admit date: 11/23/2016 Discharge date: 12/01/2016  PCP: Chris Kehr, MD  DISCHARGE DIAGNOSES:  Principal Problem:   Acute on chronic respiratory failure with hypoxia ([place]) Active Problems:   Hypothyroidism   Hyperlipidemia with target LDL less than 70; Statin intolerant   Essential hypertension   Coronary artery disease involving native heart without angina pectoris   IPF (idiopathic pulmonary fibrosis) (HCC)   Acute on chronic congestive heart failure (HCC)   AKI (acute kidney injury) (HCC)   Thrombocytopenia (HCC)   Pressure injury of skin   Hypoxia   Shortness of breath   Goals of care, counseling/discussion   Palliative care encounter   RECOMMENDATIONS FOR OUTPATIENT FOLLOW UP: 1. Patient to be followed by hospice services 2. Needs oxygen  DISCHARGE CONDITION: fair  Diet recommendation: Heart healthy  Filed Weights   11/28/16 0432 11/30/16 0505 12/01/16 0456  Weight: 68.6 kg (151 lb 3.2 oz) 69.4 kg (152 lb 14.4 oz) 67.4 kg (148 lb 8 oz)    INITIAL HISTORY: 83 y.o.malewith a Past Medical History of hypothyroidism, HLD, IPF, HTN who presented with SOB. No significant evidence of volume overload, respiratory failure most likely related to progression of IPF, been followed by pulmonary and cardiology, recommendation is for hospice at his assisted living facility.  Consultations: Cardiology. Pulmonology. Palliative medicine   HOSPITAL COURSE:   Acute on chronic hypoxic respiratory failure Most likely due to pulmonary fibrosis and heart failure. VQ scan negative for PE. Currently on high flow oxygen by nasal cannula. Stable on same. stable to be transfer to facility when oxygen concentrator is delivered.   Acute on chronic systolic and diastolic congestive heart failure Most recent echo 08/2016 with EF 25-30% with diffuse hypokinesis and  grade 1 diastolic dysfunction. Patient is on oral Lasix. Additionally he has received a few doses of intravenous Lasix. Appears to be euvolemic at this time. Continue Cozaar, Coreg and Aldactone.   Idiopathic pulmonary fibrosis  Continue with oxygen. Seen by pulmonology. Palliative medicine consult recommended. Seen by palliative medicine. He is a candidate for hospice services. This will be arranged at the assisted living facility.   Ascending aortic aneurysm 4.9cm was 4.4 on prior CT. Patient not a candidate for surgical intervention.   Thrombocytopenia Patient's usually baseline on the lower side 120s to 130s. Counts are low but stable. No evidence for bleeding. Continue to monitor periodically.  Hyperlipidemia Continue statin  Essential Hypertension Continue with home medication  Hypothyroidism Continue Synthroid  Patient has improved over the last few days. He is stable for discharge.   PERTINENT LABS:  The results of significant diagnostics from this hospitalization (including imaging, microbiology, ancillary and laboratory) are listed below for reference.    Microbiology: Recent Results (from the past 240 hour(s))  Urine culture     Status: None   Collection Time: 11/23/16  2:25 PM  Result Value Ref Range Status   Specimen Description URINE, CATHETERIZED  Final   Special Requests NONE  Final   Culture NO GROWTH  Final   Report Status 11/24/2016 FINAL  Final  MRSA PCR Screening     Status: None   Collection Time: 11/23/16  4:59 PM  Result Value Ref Range Status   MRSA by PCR NEGATIVE NEGATIVE Final    Comment:        The GeneXpert MRSA Assay (FDA approved for NASAL specimens only), is one component of a  comprehensive MRSA colonization surveillance program. It is not intended to diagnose MRSA infection nor to guide or monitor treatment for MRSA infections.   Culture, blood (routine x 2) Call MD if unable to obtain prior to antibiotics being given      Status: None   Collection Time: 11/23/16  5:00 PM  Result Value Ref Range Status   Specimen Description BLOOD LEFT ARM  Final   Special Requests BOTTLES DRAWN AEROBIC ONLY [place]  Final   Culture NO GROWTH 5 DAYS  Final   Report Status 11/28/2016 FINAL  Final  Culture, blood (routine x 2) Call MD if unable to obtain prior to antibiotics being given     Status: None   Collection Time: 11/23/16  5:10 PM  Result Value Ref Range Status   Specimen Description BLOOD LEFT HAND  Final   Special Requests BOTTLES DRAWN AEROBIC ONLY  [place]  Final   Culture NO GROWTH 5 DAYS  Final   Report Status 11/28/2016 FINAL  Final  Culture, sputum-assessment     Status: None   Collection Time: 11/26/16 11:39 AM  Result Value Ref Range Status   Specimen Description EXPECTORATED SPUTUM  Final   Special Requests NONE  Final   Sputum evaluation   Final    Sputum specimen not acceptable for testing.  Please recollect.   Results Called to: T. FAIRING, RN AT 1400 ON 11/26/16 BY C. JESSUP, MLT.    Report Status 11/26/2016 FINAL  Final     Labs: Basic Metabolic Panel:  Recent Labs Lab 11/25/16 0447 11/27/16 0412 11/29/16 0455  NA 138 139 137  K 4.0 3.3* 3.9  CL 102 100* 100*  CO2 28 32 29  GLUCOSE 90 84 97  BUN 16 12 19   CREATININE 1.25* 1.03 1.20  CALCIUM 9.0 9.2 9.5   CBC:  Recent Labs Lab 11/25/16 0447  WBC 7.8  HGB 14.1  HCT 43.5  MCV 103.1*  PLT 76*   Cardiac Enzymes: No results for input(s): CKTOTAL, CKMB, CKMBINDEX, TROPONINI in the last 168 hours. BNP: BNP (last 3 results)  Recent Labs  11/23/16 1139 11/26/16 0317  BNP 1,680.4* 1,713.8*     IMAGING STUDIES Dg Chest 2 View  Result Date: 11/23/2016 CLINICAL DATA:  Short of breath and cough. History of pulmonary fibrosis. EXAM: CHEST  2 VIEW COMPARISON:  07/16/2016 FINDINGS: Prior median sternotomy. Midline trachea. Moderate cardiomegaly. Right superior mediastinal soft tissue fullness is present back to 06/03/2014 and is  likely attributed to tortuous aorta and possibly mild adenopathy. Bilateral pleural thickening or trace pleural fluid. No pneumothorax. Severe interstitial lung disease is lower lobe and peripheral predominant. Given this underlying chronic finding, there is no convincing evidence of superimposed acute pneumonia or overt congestive failure. Apparent increase in interstitial prominence is felt to be due to AP portable technique and resultant decreased inspiratory effort. IMPRESSION: Interstitial lung disease, consistent with the history of pulmonary fibrosis. No convincing evidence of acute superimposed process. Cardiomegaly.  No overt congestive failure. Electronically Signed   By: Abigail Miyamoto M.D.   On: 11/23/2016 12:14   Ct Chest High Resolution  Result Date: 11/24/2016 CLINICAL DATA:  83 year old male with history of idiopathic pulmonary fibrosis. Follow-up study. EXAM: CT CHEST WITHOUT CONTRAST TECHNIQUE: Multidetector CT imaging of the chest was performed following the standard protocol without intravenous contrast. High resolution imaging of the lungs, as well as inspiratory and expiratory imaging, was performed. COMPARISON:  Chest CT 11/10/2012. FINDINGS: Cardiovascular: Heart size is enlarged with  severe right atrial dilatation. There is no significant pericardial fluid, thickening or pericardial calcification. There is aortic atherosclerosis, as well as atherosclerosis of the great vessels of the mediastinum and the coronary arteries, including calcified atherosclerotic plaque in the left main, left anterior descending, left circumflex and right coronary arteries. Status post median sternotomy for CABG, including LIMA to the LAD. Calcifications of the aortic valve. Aneurysmal dilatation of ascending thoracic aorta which measures up to 4.9 cm in diameter. Ectasia of the aortic arch which measures up to 4.1 cm in diameter distally. Mediastinum/Nodes: Multiple borderline enlarged and enlarged mediastinal  and bilateral hilar lymph nodes measuring up to 2 cm in short axis in the low right paratracheal nodal station. Esophagus is unremarkable in appearance. No axillary lymphadenopathy. Lungs/Pleura: High-resolution images demonstrate widespread subpleural reticulation, parenchymal banding, traction bronchiectasis and honeycombing, all of which demonstrates a craniocaudal gradient. These findings have progressed compared to prior study from 11/10/2012, and are considered diagnostic from an imaging standpoint of usual interstitial pneumonia (UIP). Inspiratory and expiratory imaging is unremarkable. Mild diffuse bronchial wall thickening with mild to moderate centrilobular and paraseptal emphysema noted predominantly in the lung apices. Small bilateral pleural effusions lying dependently. No acute consolidative airspace disease. Upper Abdomen: Aortic atherosclerosis. Incompletely visualized low-attenuation lesion measuring at least 3.3 cm in the upper pole of left kidney, slightly larger than remote prior study from 2013, presumably a cyst. Multiple small calcified granulomas are noted in the liver. Musculoskeletal: Median sternotomy wires. There are no aggressive appearing lytic or blastic lesions noted in the visualized portions of the skeleton. IMPRESSION: 1. The appearance of the lungs is compatible with interstitial lung disease, and findings are considered diagnostic of usual interstitial pneumonia (UIP). 2. There is also mild diffuse bronchial wall thickening with mild to moderate centrilobular and paraseptal emphysema noted predominantly in the lung apices. 3. Cardiomegaly with severe right atrial dilatation. 4. Aortic atherosclerosis, in addition to left main and 3 vessel coronary artery disease. Status post median sternotomy for CABG, including LIMA to the LAD. 5. There are calcifications of the aortic valve. Echocardiographic correlation for evaluation of potential valvular dysfunction may be warranted if  clinically indicated. 6. Aneurysmal dilatation of the ascending thoracic aorta (4.9 cm in diameter). Ascending thoracic aortic aneurysm. Recommend semi-annual imaging followup by CTA or MRA and referral to cardiothoracic surgery if not already obtained. This recommendation follows 2010 ACCF/AHA/AATS/ACR/ASA/SCA/SCAI/SIR/STS/SVM Guidelines for the Diagnosis and Management of Patients With Thoracic Aortic Disease. Circulation. 2010; 121: [id number]. 7. Additional incidental findings, as above. Electronically Signed   By: Vinnie Langton M.D.   On: 11/24/2016 16:13   Nm Pulmonary Perf And Vent  Result Date: 11/24/2016 CLINICAL DATA:  Chronic respiratory failure through severe hypoxia. Congestive heart failure. EXAM: NUCLEAR MEDICINE VENTILATION - PERFUSION LUNG SCAN TECHNIQUE: Ventilation images were obtained in multiple projections using inhaled aerosol Tc-103m DTPA. Perfusion images were obtained in multiple projections after intravenous injection of Tc-6m MAA. RADIOPHARMACEUTICALS:  31.3 mCi Technetium-27m DTPA aerosol inhalation and 4.2 mCi Technetium-28m MAA IV COMPARISON:  Focal full FINDINGS: Ventilation: No significant focal ventilation defect. Perfusion: No wedge shaped peripheral perfusion defects to suggest acute pulmonary embolism. IMPRESSION: No evidence suggestive recent pulmonary embolism. Electronically Signed   By: Lorriane Shire M.D.   On: 11/24/2016 10:11    DISCHARGE EXAMINATION: Vitals:   11/30/16 0505 11/30/16 1202 11/30/16 2300 12/01/16 0456  BP: 104/60 110/65 112/76 123/81  Pulse: 69 66 69 68  Resp: 18 18 18 18   Temp: 98.4 F (36.9 C)  25 F (36.7 C) 98.5 F (36.9 C) 97.9 F (36.6 C)  TempSrc: Oral Oral Oral Oral  SpO2: 94% 98% 92% 94%  Weight: 69.4 kg (152 lb 14.4 oz)   67.4 kg (148 lb 8 oz)  Height:       General appearance: alert, cooperative, appears stated age and no distress Resp: clear to auscultation bilaterally Cardio: regular rate and rhythm, S1, S2 normal, no  murmur, click, rub or gallop GI: soft, non-tender; bowel sounds normal; no masses,  no organomegaly Extremities: extremities normal, atraumatic, no cyanosis or edema  DISPOSITION: Assisted living facility  Discharge Instructions    Call MD for:  difficulty breathing, headache or visual disturbances    Complete by:  As directed    Call MD for:  extreme fatigue    Complete by:  As directed    Call MD for:  persistant dizziness or light-headedness    Complete by:  As directed    Call MD for:  persistant nausea and vomiting    Complete by:  As directed    Call MD for:  severe uncontrolled pain    Complete by:  As directed    Call MD for:  temperature >100.4    Complete by:  As directed    Diet - low sodium heart healthy    Complete by:  As directed    Discharge instructions    Complete by:  As directed    Patient to be followed by hospice services at assisted living facility.  You were cared for by a hospitalist during your hospital stay. If you have any questions about your discharge medications or the care you received while you were in the hospital after you are discharged, you can call the unit and asked to speak with the hospitalist on call if the hospitalist that took care of you is not available. Once you are discharged, your primary care physician will handle any further medical issues. Please note that NO REFILLS for any discharge medications will be authorized once you are discharged, as it is imperative that you return to your primary care physician (or establish a relationship with a primary care physician if you do not have one) for your aftercare needs so that they can reassess your need for medications and monitor your lab values. If you do not have a primary care physician, you can call [phone] for a physician referral.   Increase activity slowly    Complete by:  As directed    Increase activity slowly    Complete by:  As directed       ALLERGIES:  Allergies  Allergen  Reactions  . Lipitor [Atorvastatin] Other (See Comments)    CRAMPS  . Ramipril Cough  . Simvastatin Other (See Comments)    MYALGIA  . Sulfonamide Derivatives Rash     Current Discharge Medication List    START taking these medications   Details  morphine 10 MG/5ML solution Take 1 mL (2 mg total) by mouth every 2 (two) hours as needed (dyspnea). Qty: 100 mL, Refills: 0    traZODone (DESYREL) 50 MG tablet Take 0.5 tablets (25 mg total) by mouth at bedtime. Qty: 30 tablet, Refills: 0      CONTINUE these medications which have CHANGED   Details  allopurinol (ZYLOPRIM) 100 MG tablet Take 2 tablets (200 mg total) by mouth daily. Qty: 180 tablet, Refills: 0    amLODipine (NORVASC) 10 MG tablet Take 1 tablet (10 mg total) by mouth daily.  Qty: 30 tablet, Refills: 0    carvedilol (COREG) 12.5 MG tablet Take 1 tablet (12.5 mg total) by mouth 2 (two) times daily with a meal. Qty: 60 tablet, Refills: 0    donepezil (ARICEPT) 10 MG tablet Take 10 mg by mouth at bedtime Qty: 30 tablet, Refills: 0    furosemide (LASIX) 40 MG tablet Take 1 tablet (40 mg total) by mouth daily. Qty: 30 tablet, Refills: 0    levothyroxine (SYNTHROID, LEVOTHROID) 150 MCG tablet Take 1 tablet (150 mcg total) by mouth daily before breakfast. Qty: 30 tablet, Refills: 0    losartan (COZAAR) 100 MG tablet Take 1 tablet (100 mg total) by mouth daily. Qty: 30 tablet, Refills: 0    Mesalamine 800 MG TBEC Take 2 tablets (1,600 mg total) by mouth 3 (three) times daily. Qty: 180 tablet, Refills: 0    mirabegron ER (MYRBETRIQ) 50 MG TB24 tablet Take 1 tablet (50 mg total) by mouth daily. Qty: 30 tablet, Refills: 0    primidone (MYSOLINE) 50 MG tablet Take 50 mg by mouth three times a day for tremor(s) Qty: 90 tablet, Refills: 0    spironolactone (ALDACTONE) 25 MG tablet Take 0.5 tablets (12.5 mg total) by mouth daily. Qty: 45 tablet, Refills: 0      CONTINUE these medications which have NOT CHANGED    Details  alum hydroxide-mag trisilicate (GAVISCON) [id number] MG CHEW chewable tablet Chew 2 tablets by mouth 2 (two) times daily as needed for indigestion or heartburn. Qty: 120 tablet, Refills: 2    aspirin EC 81 MG tablet Take 81 mg by mouth daily.    calcium carbonate (TUMS - DOSED IN MG ELEMENTAL CALCIUM) 500 MG chewable tablet Chew 1 tablet by mouth 3 (three) times daily as needed for indigestion or heartburn. Reported on 01/27/2016    carboxymethylcellulose (REFRESH PLUS) 0.5 % SOLN Place 1 drop into both eyes 3 (three) times daily as needed (for dry eyes).     cetirizine (ZYRTEC) 10 MG tablet Take 10 mg by mouth daily.     cholecalciferol (VITAMIN D) 1000 units tablet Take 1,000 Units by mouth 2 (two) times daily.    folic acid (FOLVITE) [id number] MCG tablet Take 800 mcg by mouth daily.      ipratropium (ATROVENT) 0.06 % nasal spray Place 2 sprays into the nose 4 (four) times daily as needed for rhinitis (runny nose). Qty: 45 mL, Refills: 2    Omega-3 Fatty Acids (FISH OIL) 1000 MG CAPS Take 1,000 mg by mouth every evening.     OXYGEN Inhale 2 L/min into the lungs See admin instructions. Via nasal cannula as needed for activity and sleep    Probiotic Product (PROBIOTIC DAILY PO) Take 1 capsule by mouth daily.    UNABLE TO FIND Liquid Life: Drink 1 ounce by mouth every morning      STOP taking these medications     gabapentin (NEURONTIN) 300 MG capsule      ibuprofen (ADVIL,MOTRIN) 200 MG tablet      levofloxacin (LEVAQUIN) 750 MG tablet      tolterodine (DETROL LA) 4 MG 24 hr capsule         Follow-up Information    Chris Kehr, MD. Schedule an appointment as soon as possible for a visit.   Specialty:  Internal Medicine Contact information: [place]  [phone]        [facility] at [facility] Follow up.   Specialty:  Hospice and Palliative Medicine Why:  They will provide assistance for you at your home Contact information: [place] [id number] [phone]           TOTAL DISCHARGE TIME: 35 minutes  Niel Hummer A  [facility] Pager [phone]  12/01/2016, 10:42 AM

## 2016-12-01 NOTE — Clinical Social Work Note (Addendum)
CSW facilitated patient discharge including contacting patient family and facility to confirm patient discharge plans. Clinical information faxed to facility and family agreeable with plan. CSW arranged ambulance transport via [facility] to [facility] around 2:00 pm. RN to call report prior to discharge [phone]).  CSW will sign off for now as social work intervention is no longer needed. Please consult [place] again if new needs arise.  Dayton Scrape, [place]  2:07 pm CSW received call from RN that patient's daughter now wants a hospital bed. CSW called Amy with [facility]. She is aware and has put in the order for the hospital bed. Patient will use his wife's hospital bed until his arrives at the facility. CSW called [facility] and confirmed that his oxygen concentrator has arrived and is set up. RN notified and she will let patient and his daughter know.  Dayton Scrape, [place]

## 2016-12-02 ENCOUNTER — Telehealth: Payer: Self-pay | Admitting: *Deleted

## 2016-12-02 NOTE — Telephone Encounter (Signed)
Called pt spoke w/pt daughter [name]) she states they have made the decision to let the facility MD follow-up with dad. They can no longer bring him to the office. Dad is very sick. She also stated that hospice service will also be f/u w/dad sometime in next week or so. She had already cancel his appt w/Dr. Alain Marion...Johny Chess

## 2016-12-07 ENCOUNTER — Ambulatory Visit: Payer: PPO | Admitting: Adult Health

## 2016-12-10 ENCOUNTER — Inpatient Hospital Stay: Payer: PPO | Admitting: Internal Medicine

## 2017-01-25 ENCOUNTER — Telehealth: Payer: Self-pay | Admitting: Adult Health

## 2017-01-25 NOTE — Telephone Encounter (Signed)
Spoke with pt daughter [name], aware of rec's per TP.  Pt still refusing to go to ED for stroke symptoms - refusing any further treatment.  Daughter states that they are trying to keep him comfortable at this point.  Nothing further needed.

## 2017-01-25 NOTE — Telephone Encounter (Signed)
Per TP: okay to use the mask for O2.  May also try saline or Ayr gel as well.  If he has stroke symptoms and does not want to go to the ER, continue per Hospice protocol.  Thanks.

## 2017-01-25 NOTE — Telephone Encounter (Signed)
Spoke with pt's daughter, who states pt currently using oxymizer cannula at 8L. [name] states cannula is causing pt's nose to be very uncomfortable, and causing a lot of of pain X 2d. Pt's pulmonary symptoms are baseline.  [name] has switched pt's cannula to a mask about an hour ago. [name] would like recommendations. Hospice nurse visited with pt this morning, and felt that pt may had a mild stroke over the weekend. Pt has some Left side weakness & left side drooping. Pt refused to go to ED.   TP please advise. Thanks.

## 2017-03-29 DEATH — deceased

## 2018-03-15 IMAGING — NM NM MISC PROCEDURE
6 series · 36 of 36 positions shown · non-contrast
Comparison: none

[Series 1: wbr rest · 6.40mm/px · 6 of 64 frames shown]
[frame 6/64]
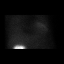
[frame 16/64]
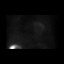
[frame 27/64]
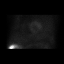
[frame 38/64]
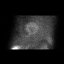
[frame 48/64]
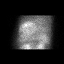
[frame 59/64]
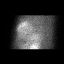

[Series 1: wbr_r-proj_st wbr rest · 6.40mm/px · 6 of 64 frames shown]
[frame 6/64]
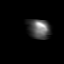
[frame 16/64]
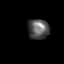
[frame 27/64]
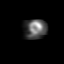
[frame 38/64]
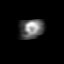
[frame 48/64]
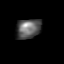
[frame 59/64]
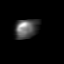

[Series 2: wbr_s-proj_st wbr stress-gsp · 6.40mm/px · 6 of 512 frames shown]
[frame 43/512]
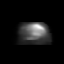
[frame 128/512]
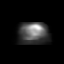
[frame 214/512]
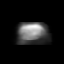
[frame 299/512]
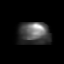
[frame 384/512]
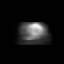
[frame 470/512]
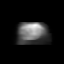

[Series 2: wbr stress-gsp · 6.40mm/px · 6 of 512 frames shown]
[frame 43/512]
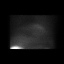
[frame 128/512]
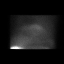
[frame 214/512]
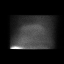
[frame 299/512]
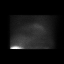
[frame 384/512]
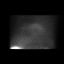
[frame 470/512]
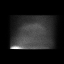

[Series 3: wbr_s-proj_st wbr stress-sum-em · 6.40mm/px · 6 of 64 frames shown]
[frame 6/64]
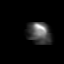
[frame 16/64]
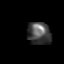
[frame 27/64]
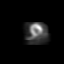
[frame 38/64]
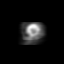
[frame 48/64]
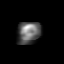
[frame 59/64]
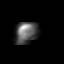

[Series 3: wbr stress-sum-em · 6.40mm/px · 6 of 64 frames shown]
[frame 6/64]
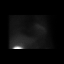
[frame 16/64]
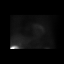
[frame 27/64]
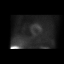
[frame 38/64]
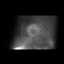
[frame 48/64]
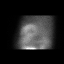
[frame 59/64]
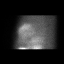

[36 of 36 positions shown; findings below may reference images not displayed]

Canned report from images found in remote index.

Refer to host system for actual result text.
